# Patient Record
Sex: Female | Born: 1935 | Race: White | Hispanic: No | Smoking: Never smoker
Health system: Southern US, Community
[De-identification: ages and names within clinical notes are randomized; demographics above are authoritative.]

## PROBLEM LIST (undated history)

## (undated) DIAGNOSIS — F329 Major depressive disorder, single episode, unspecified: Secondary | ICD-10-CM

## (undated) DIAGNOSIS — G43909 Migraine, unspecified, not intractable, without status migrainosus: Secondary | ICD-10-CM

## (undated) DIAGNOSIS — R7303 Prediabetes: Secondary | ICD-10-CM

## (undated) DIAGNOSIS — E559 Vitamin D deficiency, unspecified: Secondary | ICD-10-CM

## (undated) DIAGNOSIS — D649 Anemia, unspecified: Secondary | ICD-10-CM

## (undated) DIAGNOSIS — G4730 Sleep apnea, unspecified: Secondary | ICD-10-CM

## (undated) DIAGNOSIS — K219 Gastro-esophageal reflux disease without esophagitis: Secondary | ICD-10-CM

## (undated) DIAGNOSIS — F419 Anxiety disorder, unspecified: Secondary | ICD-10-CM

## (undated) DIAGNOSIS — I10 Essential (primary) hypertension: Secondary | ICD-10-CM

## (undated) DIAGNOSIS — M1990 Unspecified osteoarthritis, unspecified site: Secondary | ICD-10-CM

## (undated) DIAGNOSIS — F32A Depression, unspecified: Secondary | ICD-10-CM

## (undated) DIAGNOSIS — E785 Hyperlipidemia, unspecified: Secondary | ICD-10-CM

## (undated) HISTORY — DX: Sleep apnea, unspecified: G47.30

## (undated) HISTORY — DX: Vitamin D deficiency, unspecified: E55.9

## (undated) HISTORY — DX: Unspecified osteoarthritis, unspecified site: M19.90

## (undated) HISTORY — DX: Anemia, unspecified: D64.9

## (undated) HISTORY — DX: Hyperlipidemia, unspecified: E78.5

## (undated) HISTORY — DX: Depression, unspecified: F32.A

## (undated) HISTORY — DX: Prediabetes: R73.03

## (undated) HISTORY — DX: Major depressive disorder, single episode, unspecified: F32.9

## (undated) HISTORY — PX: CATARACT EXTRACTION: SUR2

## (undated) HISTORY — DX: Anxiety disorder, unspecified: F41.9

## (undated) HISTORY — DX: Gastro-esophageal reflux disease without esophagitis: K21.9

## (undated) HISTORY — DX: Essential (primary) hypertension: I10

## (undated) HISTORY — DX: Migraine, unspecified, not intractable, without status migrainosus: G43.909

## (undated) HISTORY — PX: KNEE ARTHROSCOPY: SUR90

## (undated) HISTORY — PX: WRIST FRACTURE SURGERY: SHX121

## (undated) HISTORY — PX: BUNIONECTOMY: SHX129

---

## 1997-05-07 ENCOUNTER — Ambulatory Visit (HOSPITAL_BASED_OUTPATIENT_CLINIC_OR_DEPARTMENT_OTHER): Admission: RE | Admit: 1997-05-07 | Discharge: 1997-05-07 | Payer: Self-pay | Admitting: Orthopedic Surgery

## 1997-08-13 ENCOUNTER — Encounter: Admission: RE | Admit: 1997-08-13 | Discharge: 1997-11-11 | Payer: Self-pay | Admitting: Psychiatry

## 1997-10-31 ENCOUNTER — Ambulatory Visit (HOSPITAL_BASED_OUTPATIENT_CLINIC_OR_DEPARTMENT_OTHER): Admission: RE | Admit: 1997-10-31 | Discharge: 1997-10-31 | Payer: Self-pay | Admitting: Orthopedic Surgery

## 1997-11-08 ENCOUNTER — Other Ambulatory Visit: Admission: RE | Admit: 1997-11-08 | Discharge: 1997-11-08 | Payer: Self-pay | Admitting: Obstetrics and Gynecology

## 1998-11-28 ENCOUNTER — Ambulatory Visit (HOSPITAL_BASED_OUTPATIENT_CLINIC_OR_DEPARTMENT_OTHER): Admission: RE | Admit: 1998-11-28 | Discharge: 1998-11-28 | Payer: Self-pay | Admitting: Orthopedic Surgery

## 1999-04-24 ENCOUNTER — Encounter: Admission: RE | Admit: 1999-04-24 | Discharge: 1999-04-24 | Payer: Self-pay | Admitting: Psychiatry

## 1999-04-24 ENCOUNTER — Encounter: Payer: Self-pay | Admitting: Obstetrics and Gynecology

## 1999-04-27 ENCOUNTER — Encounter: Admission: RE | Admit: 1999-04-27 | Discharge: 1999-04-27 | Payer: Self-pay | Admitting: Obstetrics and Gynecology

## 1999-04-27 ENCOUNTER — Encounter: Payer: Self-pay | Admitting: Obstetrics and Gynecology

## 1999-09-21 ENCOUNTER — Encounter: Payer: Self-pay | Admitting: Emergency Medicine

## 1999-09-21 ENCOUNTER — Emergency Department (HOSPITAL_COMMUNITY): Admission: EM | Admit: 1999-09-21 | Discharge: 1999-09-21 | Payer: Self-pay | Admitting: Emergency Medicine

## 1999-09-25 ENCOUNTER — Encounter: Payer: Self-pay | Admitting: *Deleted

## 1999-09-30 ENCOUNTER — Encounter (INDEPENDENT_AMBULATORY_CARE_PROVIDER_SITE_OTHER): Payer: Self-pay | Admitting: Specialist

## 1999-10-01 ENCOUNTER — Inpatient Hospital Stay (HOSPITAL_COMMUNITY): Admission: EM | Admit: 1999-10-01 | Discharge: 1999-10-02 | Payer: Self-pay | Admitting: *Deleted

## 2000-04-27 ENCOUNTER — Encounter: Admission: RE | Admit: 2000-04-27 | Discharge: 2000-04-27 | Payer: Self-pay | Admitting: Obstetrics and Gynecology

## 2000-04-27 ENCOUNTER — Encounter: Payer: Self-pay | Admitting: Obstetrics and Gynecology

## 2000-04-28 ENCOUNTER — Encounter: Payer: Self-pay | Admitting: Obstetrics and Gynecology

## 2000-04-28 ENCOUNTER — Encounter: Admission: RE | Admit: 2000-04-28 | Discharge: 2000-04-28 | Payer: Self-pay | Admitting: Obstetrics and Gynecology

## 2000-11-14 ENCOUNTER — Other Ambulatory Visit: Admission: RE | Admit: 2000-11-14 | Discharge: 2000-11-14 | Payer: Self-pay | Admitting: Internal Medicine

## 2000-11-14 ENCOUNTER — Encounter (INDEPENDENT_AMBULATORY_CARE_PROVIDER_SITE_OTHER): Payer: Self-pay | Admitting: Specialist

## 2001-01-25 HISTORY — PX: CHOLECYSTECTOMY: SHX55

## 2001-04-20 ENCOUNTER — Ambulatory Visit (HOSPITAL_BASED_OUTPATIENT_CLINIC_OR_DEPARTMENT_OTHER): Admission: RE | Admit: 2001-04-20 | Discharge: 2001-04-20 | Payer: Self-pay | Admitting: Obstetrics and Gynecology

## 2001-04-20 ENCOUNTER — Encounter (INDEPENDENT_AMBULATORY_CARE_PROVIDER_SITE_OTHER): Payer: Self-pay | Admitting: Specialist

## 2001-05-01 ENCOUNTER — Encounter: Payer: Self-pay | Admitting: Obstetrics and Gynecology

## 2001-05-01 ENCOUNTER — Encounter: Admission: RE | Admit: 2001-05-01 | Discharge: 2001-05-01 | Payer: Self-pay | Admitting: Obstetrics and Gynecology

## 2001-07-17 ENCOUNTER — Encounter: Payer: Self-pay | Admitting: Internal Medicine

## 2001-07-17 ENCOUNTER — Encounter: Admission: RE | Admit: 2001-07-17 | Discharge: 2001-07-17 | Payer: Self-pay | Admitting: Internal Medicine

## 2002-02-06 ENCOUNTER — Ambulatory Visit (HOSPITAL_COMMUNITY): Admission: RE | Admit: 2002-02-06 | Discharge: 2002-02-06 | Payer: Self-pay | Admitting: Orthopedic Surgery

## 2002-04-21 ENCOUNTER — Emergency Department (HOSPITAL_COMMUNITY): Admission: EM | Admit: 2002-04-21 | Discharge: 2002-04-21 | Payer: Self-pay | Admitting: Emergency Medicine

## 2002-04-21 ENCOUNTER — Encounter: Payer: Self-pay | Admitting: Emergency Medicine

## 2002-05-03 ENCOUNTER — Encounter: Payer: Self-pay | Admitting: Obstetrics and Gynecology

## 2002-05-03 ENCOUNTER — Encounter: Admission: RE | Admit: 2002-05-03 | Discharge: 2002-05-03 | Payer: Self-pay | Admitting: Obstetrics and Gynecology

## 2003-05-07 ENCOUNTER — Encounter: Admission: RE | Admit: 2003-05-07 | Discharge: 2003-05-07 | Payer: Self-pay | Admitting: Obstetrics and Gynecology

## 2003-10-29 ENCOUNTER — Encounter: Admission: RE | Admit: 2003-10-29 | Discharge: 2003-10-29 | Payer: Self-pay | Admitting: Obstetrics and Gynecology

## 2003-11-14 ENCOUNTER — Encounter (INDEPENDENT_AMBULATORY_CARE_PROVIDER_SITE_OTHER): Payer: Self-pay | Admitting: Specialist

## 2003-11-14 ENCOUNTER — Encounter: Admission: RE | Admit: 2003-11-14 | Discharge: 2003-11-14 | Payer: Self-pay | Admitting: Surgery

## 2004-10-21 ENCOUNTER — Encounter: Admission: RE | Admit: 2004-10-21 | Discharge: 2004-10-21 | Payer: Self-pay | Admitting: Obstetrics and Gynecology

## 2005-09-29 ENCOUNTER — Encounter: Admission: RE | Admit: 2005-09-29 | Discharge: 2005-09-29 | Payer: Self-pay | Admitting: Obstetrics and Gynecology

## 2006-03-16 ENCOUNTER — Emergency Department (HOSPITAL_COMMUNITY): Admission: EM | Admit: 2006-03-16 | Discharge: 2006-03-16 | Payer: Self-pay | Admitting: Emergency Medicine

## 2006-10-03 ENCOUNTER — Encounter: Admission: RE | Admit: 2006-10-03 | Discharge: 2006-10-03 | Payer: Self-pay | Admitting: Obstetrics and Gynecology

## 2007-08-01 ENCOUNTER — Emergency Department (HOSPITAL_COMMUNITY): Admission: EM | Admit: 2007-08-01 | Discharge: 2007-08-02 | Payer: Self-pay | Admitting: Emergency Medicine

## 2007-10-04 ENCOUNTER — Encounter: Admission: RE | Admit: 2007-10-04 | Discharge: 2007-10-04 | Payer: Self-pay | Admitting: Obstetrics and Gynecology

## 2008-10-07 ENCOUNTER — Encounter: Admission: RE | Admit: 2008-10-07 | Discharge: 2008-10-07 | Payer: Self-pay | Admitting: Obstetrics and Gynecology

## 2008-10-07 ENCOUNTER — Encounter (INDEPENDENT_AMBULATORY_CARE_PROVIDER_SITE_OTHER): Payer: Self-pay | Admitting: *Deleted

## 2008-10-07 LAB — HM MAMMOGRAPHY: HM Mammogram: NORMAL

## 2008-11-05 ENCOUNTER — Ambulatory Visit: Payer: Self-pay | Admitting: Internal Medicine

## 2008-11-18 ENCOUNTER — Ambulatory Visit: Payer: Self-pay | Admitting: Internal Medicine

## 2008-11-27 ENCOUNTER — Encounter: Payer: Self-pay | Admitting: Internal Medicine

## 2009-10-09 ENCOUNTER — Encounter: Admission: RE | Admit: 2009-10-09 | Discharge: 2009-10-09 | Payer: Self-pay | Admitting: Obstetrics and Gynecology

## 2010-02-17 LAB — HM PAP SMEAR: HM Pap smear: NORMAL

## 2010-06-12 NOTE — Op Note (Signed)
[facility]  Patient:    Stephanie Roach, Stephanie Roach                      MRN: [id number] Proc. Date: 09/30/99 Adm. Date:  [id number] Attending:  Kandis Mannan CC:         Ammie Dalton, M.D.  Gypsy Balsam, M.D.  Hedwig Morton. Juanda Chance, M.D. [facility]   Operative Report  CCS NUMBER:  [id number]  PREOPERATIVE DIAGNOSIS:  Chronic cholecystitis with cholelithiasis.  POSTOPERATIVE DIAGNOSIS:  Chronic cholecystitis with cholelithiasis.  OPERATION:  Laparoscopic cholecystectomy.  SURGEON:  Maisie Fus B. Samuella Cota, M.D.  ASSISTANT:  Anselm Pancoast. Zachery Dakins, M.D.  ANESTHESIA:  General.  ANESTHESIOLOGIST:  CRNA.  DESCRIPTION OF PROCEDURE:  The patient was taken to the operating room and placed on the table in the supine position.  After satisfactory general anesthetic with intubation, the entire abdomen was prepped and draped as a sterile field.  A vertical infraumbilical incision was made through the skin and subcutaneous tissue.  The midline fascia was divided and the peritoneal cavity entered.  A finger was placed into the peritoneal cavity.  A pursestring suture of 0 Vicryl was placed and the Hassan trocar placed into the abdomen and the abdomen was insufflated to 14 mmHg pressure.  A second 10 mm trocar was placed just to the right of the midline in the subxiphoid area.  Two 5 mm trocars were placed laterally.  Exploration of the pelvis was unremarkable.  The patients gallbladder was placed on traction and there were no adhesions to the gallbladder.  The cystic duct was readily identified, was a small cystic duct with good length.  The junction of the cystic duct to the gallbladder was well-identified.  The cystic duct was triply clipped on remaining side, and once on the gallbladder side and divided.  The cystic artery was readily seen and triply clipped on the remaining side once on the gallbladder side and divided.  The gallbladder was dissected from the bed.   A second quadrant was identified and this was triply clipped and divided.  The gallbladder was dissected from the bed with hemostasis being controlled.  The gallbladder was then easily removed from the infraumbilical incision.  There was some bleeding noted at the 5 mm trocar site which was placed immediately. Attempts were made to cauterize this from within, but there was still some ooze, so an endosuture was placed using 0 Vicryls.  The suture was tied down after it traversed the puncture site and bleeding was controlled.  The abdomen was copiously irrigated with saline.  The infraumbilical incision was then closed with two figure-of-eight sutures placed vertically.  This gave good closure.  A second 5 mm trocar was removed and there was no bleeding. After the fluid in the right upper quadrant had been suctioned, the second 10 mm trocar was removed and the abdomen was deflated.  The two midline incisions were closed with running subcuticular sutures of 5-0 Vicryl.  The two lateral trocar sites were closed with interrupted sutures of 5-0 Vicryl as single sutures.  Benzoin 1/2 inch Steri-Strips were used to reinforce the skin closure.  Quarter percent Marcaine without epinephrine had been injected at all trocar sites.  The patient seemed to tolerate the procedure well and was taken to the PACU in satisfactory condition. DD:  09/30/99 TD:  09/30/99 Job: [id number]

## 2010-06-12 NOTE — Op Note (Signed)
[facility]  Patient:    Stephanie Roach, Stephanie Roach I Visit Number: [id number] MRN: [id number]          Service Type: NES Location: NESC Attending Physician:  Lendon Colonel Dictated by:   Kathie Rhodes. Kyra Manges, M.D. Proc. Date: 04/20/01 Admit Date:  04/20/2001                             Operative Report  PREOPERATIVE DIAGNOSIS:  Postmenopausal bleeding.  POSTOPERATIVE DIAGNOSIS:  Submucous myoma.  OPERATION PERFORMED:  Hysteroscopy with resection of submucous myoma.  DESCRIPTION OF PROCEDURE:  The patient was placed in lithotomy position and prepped and draped in the usual fashion, bladder emptied, cervix dilated. The hysteroscope was inserted into the uterus and there was a fundal submucous myoma which was resected without difficulty. All of the endometrial cavity was resected. All the tissue that was removed was sent to the lab for study and then we rescoped her and found her to be perfectly okay. No unusual blood loss occurred. She was awakened and carried to the recovery room in good condition. ictated by:   S. Kyra Manges, M.D. Attending Physician:  Lendon Colonel DD:  04/20/01 TD:  04/21/01 Job: [id number]

## 2010-08-06 ENCOUNTER — Encounter: Payer: Self-pay | Admitting: *Deleted

## 2010-08-06 ENCOUNTER — Encounter: Payer: Self-pay | Admitting: Family Medicine

## 2010-08-07 ENCOUNTER — Encounter (HOSPITAL_COMMUNITY): Payer: Self-pay

## 2010-08-10 ENCOUNTER — Encounter (HOSPITAL_COMMUNITY): Payer: Self-pay

## 2010-08-11 ENCOUNTER — Other Ambulatory Visit (INDEPENDENT_AMBULATORY_CARE_PROVIDER_SITE_OTHER): Payer: Medicare Other

## 2010-08-11 ENCOUNTER — Encounter: Payer: Self-pay | Admitting: Physician Assistant

## 2010-08-11 ENCOUNTER — Ambulatory Visit (INDEPENDENT_AMBULATORY_CARE_PROVIDER_SITE_OTHER): Payer: Medicare Other | Admitting: Physician Assistant

## 2010-08-11 VITALS — BP 126/64 | HR 100 | Ht 65.0 in | Wt 152.0 lb

## 2010-08-11 DIAGNOSIS — D509 Iron deficiency anemia, unspecified: Secondary | ICD-10-CM

## 2010-08-11 DIAGNOSIS — F411 Generalized anxiety disorder: Secondary | ICD-10-CM

## 2010-08-11 DIAGNOSIS — F419 Anxiety disorder, unspecified: Secondary | ICD-10-CM

## 2010-08-11 DIAGNOSIS — Z860101 Personal history of adenomatous and serrated colon polyps: Secondary | ICD-10-CM

## 2010-08-11 DIAGNOSIS — Z86010 Personal history of colonic polyps: Secondary | ICD-10-CM

## 2010-08-11 DIAGNOSIS — Z800 Family history of malignant neoplasm of digestive organs: Secondary | ICD-10-CM

## 2010-08-11 LAB — CBC WITH DIFFERENTIAL/PLATELET
Basophils Absolute: 0 10*3/uL (ref 0.0–0.1)
Basophils Relative: 0.3 % (ref 0.0–3.0)
Eosinophils Absolute: 0 10*3/uL (ref 0.0–0.7)
Eosinophils Relative: 0 % (ref 0.0–5.0)
HCT: 31.3 % — ABNORMAL LOW (ref 36.0–46.0)
Hemoglobin: 9.7 g/dL — ABNORMAL LOW (ref 12.0–15.0)
Lymphocytes Relative: 27.1 % (ref 12.0–46.0)
Lymphs Abs: 2.1 10*3/uL (ref 0.7–4.0)
MCHC: 31 g/dL (ref 30.0–36.0)
MCV: 69.3 fl — ABNORMAL LOW (ref 78.0–100.0)
Monocytes Absolute: 0.6 10*3/uL (ref 0.1–1.0)
Monocytes Relative: 7.5 % (ref 3.0–12.0)
Neutro Abs: 5.1 10*3/uL (ref 1.4–7.7)
Neutrophils Relative %: 65.1 % (ref 43.0–77.0)
Platelets: 350 10*3/uL (ref 150.0–400.0)
RBC: 4.52 Mil/uL (ref 3.87–5.11)
RDW: 16.8 % — ABNORMAL HIGH (ref 11.5–14.6)
WBC: 7.8 10*3/uL (ref 4.5–10.5)

## 2010-08-11 MED ORDER — INTEGRA F 125-1 MG PO CAPS: 1.0000 | ORAL_CAPSULE | Freq: Every day | ORAL | Status: DC

## 2010-08-11 MED ORDER — PEG-KCL-NACL-NASULF-NA ASC-C 100 G PO SOLR: 1.0000 | Freq: Once | ORAL | Status: AC

## 2010-08-11 NOTE — Progress Notes (Signed)
Subjective:    Patient ID: Stephanie Roach, female    DOB: [birth year], 75 y.o.   MRN: [id number]  HPI Stephanie Roach is a pleasant 75 year old white female known to Dr. Lina Sar from prior colonoscopies. The patient does have a positive family history of colon cancer in her father and last underwent: Colonoscopy in October of 2010 which was a negative exam with no evidence of polyps. She does have a history of tubular adenomatous polyps found in 2002, subsequent followup in 2005 was negative.  Patient is referred back today with new finding of an iron deficiency anemia. She says she has no prior history of iron deficiency or anemia other than associated with pregnancy many years ago. Most recent labs done 08/01/2010 shows a hemoglobin of 9.7 hematocrit 32.4 MCV of 70.3 platelet count 372 WBC of 6.8 electrolytes within normal limits serum iron was low at 34 TIBC greater than 450 and iron saturation not calculated Hemoccult cards x6 negative last week.  Patient does mention that she has been having some upper abdominal pain for several months which she had not was due to her irritable bowel. Her pain seems to be located in the epigastrium and is dull and fairly constant. She says her appetite has been fine and her weight has been stable. She does have episodes of urgency for bowel movement after eating and has intermittent diarrhea. She has not noted any melena or hematochezia. She denies any heartburn but says she does get periodic reflux and burning and she has a very vague complaints of dysphagia. She has not been on any acid blocker. She does take Advil fairly frequently for sinus problems though not every single day. She is a TE[employer] teacher [employer], and states that she would not accept any blood transfusions.    Review of Systems  Constitutional: Positive for fatigue.  HENT: Negative.   Eyes: Negative.   Respiratory: Negative.   Cardiovascular: Negative.   Gastrointestinal: Negative.   Genitourinary:  Negative.   Musculoskeletal: Negative.   Skin: Negative.   Neurological: Negative.   Hematological: Negative.   Psychiatric/Behavioral: The patient is nervous/anxious.    Outpatient Prescriptions Prior to Visit  Medication Sig Dispense Refill  . calcium carbonate 200 MG capsule Take 250 mg by mouth daily.        Marland Kitchen LORazepam (ATIVAN) 1 MG tablet Take 1 mg by mouth every 8 (eight) hours.        Marland Kitchen venlafaxine (EFFEXOR-XR) 75 MG 24 hr capsule Take 75 mg by mouth daily.        Marland Kitchen dicyclomine (BENTYL) 20 MG tablet Take 20 mg by mouth as needed.         Allergies  Allergen Reactions  . Morphine And Related        Objective:   Physical Exam Well-developed white female in no acute distress, anxious, pleasant, alert and oriented x3 HEENT; nontraumatic normocephalic EOMI PERRLA sclera anicteric nECK; Supple no JVD Cardiovascular; regular rate and rhythm with S1-S2 no murmur rub or gallop Pulmonary ;clear bilaterally, Abdomen; soft nontender nondistended bowel sounds active no palpable mass or hepatosplenomegaly, Rectal exam; not done, patient completed Hemoccult cards within the past week negative x6  Skin; warm and dry benign  Psych ;anxious but mood and affect otherwise appropriate        Assessment & Plan:  #83 75 year old female with new diagnosis of iron deficiency anemia, currently Hemoccult negative. Patient has personal history of adenomatous polyps and family history of colon cancer. Last colonoscopy October  2010 was unremarkable. Patient relates epigastric discomfort over the past several months and what sounds like intermittent acid reflux.  Plan Repeat CBC today to assure stability Start Prilosec 20 mg by mouth daily as a trial over the next 2 weeks to see if they want this will help with her acid reflux Start Integra , one daily for iron supplementation x3 months ,then will  need repeat iron studies. Schedule for upper endoscopy and colonoscopy with Dr. Lina Sar. Procedures were  discussed in detail with the patient and she prefers to proceed with both exams at this time.

## 2010-08-11 NOTE — Patient Instructions (Signed)
Please go to the basement level to have your labs drawn.   We sent your prescription for the Moviprep to your pharmacy, [facility]. We also sent a prescription for the iron supplement and we have given you samples. Take 1 capsule daily for 3 months. We scheduled the Endoscopy/Colonoscopy with Dr Lina Sar.  Directions and brochure provided.

## 2010-08-12 ENCOUNTER — Telehealth: Payer: Self-pay | Admitting: *Deleted

## 2010-08-12 ENCOUNTER — Telehealth: Payer: Self-pay | Admitting: Internal Medicine

## 2010-08-12 NOTE — Telephone Encounter (Signed)
Patient notified of results.

## 2010-08-12 NOTE — Progress Notes (Signed)
Reviewed and agree with management. Robert D. Kaplan, M.D., FACG  

## 2010-08-12 NOTE — Telephone Encounter (Signed)
Message copied by Daphine Deutscher on Wed Aug 12, 2010  2:07 PM ------      Message from: Icard, Virginia S      Created: Wed Aug 12, 2010  1:46 PM       PLEASE LET PT KNOW HER HGB IS 9.7, SO SAME AS LAST WEEK-STABLE

## 2010-08-12 NOTE — Telephone Encounter (Signed)
See phone note on 7/181/2

## 2010-08-12 NOTE — Telephone Encounter (Signed)
Patient asking for lab results. Please, advise

## 2010-08-14 NOTE — Progress Notes (Signed)
Reviewed and agree with management. Lorali Khamis D. Minh Jasper, M.D., FACG  

## 2010-08-27 ENCOUNTER — Ambulatory Visit (AMBULATORY_SURGERY_CENTER): Payer: Medicare Other | Admitting: Internal Medicine

## 2010-08-27 ENCOUNTER — Encounter: Payer: Self-pay | Admitting: Internal Medicine

## 2010-08-27 DIAGNOSIS — Z1211 Encounter for screening for malignant neoplasm of colon: Secondary | ICD-10-CM

## 2010-08-27 DIAGNOSIS — Z86010 Personal history of colonic polyps: Secondary | ICD-10-CM

## 2010-08-27 DIAGNOSIS — K2940 Chronic atrophic gastritis without bleeding: Secondary | ICD-10-CM

## 2010-08-27 DIAGNOSIS — Z800 Family history of malignant neoplasm of digestive organs: Secondary | ICD-10-CM

## 2010-08-27 DIAGNOSIS — A048 Other specified bacterial intestinal infections: Secondary | ICD-10-CM

## 2010-08-27 DIAGNOSIS — D759 Disease of blood and blood-forming organs, unspecified: Secondary | ICD-10-CM

## 2010-08-27 DIAGNOSIS — R109 Unspecified abdominal pain: Secondary | ICD-10-CM

## 2010-08-27 DIAGNOSIS — D509 Iron deficiency anemia, unspecified: Secondary | ICD-10-CM

## 2010-08-27 MED ORDER — SODIUM CHLORIDE 0.9 % IV SOLN: 500.0000 mL | INTRAVENOUS | Status: DC

## 2010-08-27 NOTE — Patient Instructions (Signed)
Take your Prilosec 20 mg (which you can purchase over-the-counter) EVERY DAY.  Take one capsule every morning 30 minutes before breakfast.

## 2010-08-27 NOTE — Progress Notes (Signed)
PATIENT STATING SHE WILL NOT TAKE A BLOOD TRANSFUSION.

## 2010-08-28 ENCOUNTER — Telehealth: Payer: Self-pay | Admitting: *Deleted

## 2010-08-28 NOTE — Telephone Encounter (Signed)
Follow up Call- Patient questions:  Do you have a fever, pain , or abdominal swelling? no Pain Score  2 *  Have you tolerated food without any problems? yes  Have you been able to return to your normal activities? yes  Do you have any questions about your discharge instructions: Diet   no Medications  no Follow up visit  no  Do you have questions or concerns about your Care? yes  Actions: * If pain score is 4 or above: No action needed, pain <4.

## 2010-09-01 ENCOUNTER — Encounter: Payer: Self-pay | Admitting: Internal Medicine

## 2010-09-11 ENCOUNTER — Other Ambulatory Visit: Payer: Self-pay | Admitting: Family Medicine

## 2010-09-11 DIAGNOSIS — Z1231 Encounter for screening mammogram for malignant neoplasm of breast: Secondary | ICD-10-CM

## 2010-10-12 ENCOUNTER — Ambulatory Visit
Admission: RE | Admit: 2010-10-12 | Discharge: 2010-10-12 | Disposition: A | Discharge status: Home / Self Care | Payer: Medicare Other | Source: Ambulatory Visit | Attending: Family Medicine | Admitting: Family Medicine

## 2010-10-12 DIAGNOSIS — Z1231 Encounter for screening mammogram for malignant neoplasm of breast: Secondary | ICD-10-CM

## 2010-10-16 ENCOUNTER — Other Ambulatory Visit: Payer: Self-pay | Admitting: Family Medicine

## 2010-10-16 DIAGNOSIS — R928 Other abnormal and inconclusive findings on diagnostic imaging of breast: Secondary | ICD-10-CM

## 2010-10-20 ENCOUNTER — Ambulatory Visit
Admission: RE | Admit: 2010-10-20 | Discharge: 2010-10-20 | Disposition: A | Discharge status: Home / Self Care | Payer: Medicare Other | Source: Ambulatory Visit | Attending: Family Medicine | Admitting: Family Medicine

## 2010-10-20 DIAGNOSIS — R928 Other abnormal and inconclusive findings on diagnostic imaging of breast: Secondary | ICD-10-CM

## 2010-10-22 LAB — POCT I-STAT, CHEM 8
BUN: 8
Calcium, Ion: 1.05 — ABNORMAL LOW
HCT: 38
Hemoglobin: 12.9
Sodium: 141
TCO2: 26

## 2010-10-22 LAB — POCT CARDIAC MARKERS
CKMB, poc: 1.5
Myoglobin, poc: 70
Operator id: 229371
Troponin i, poc: 0.15 — ABNORMAL HIGH

## 2010-10-22 LAB — CK TOTAL AND CKMB (NOT AT ARMC)
Relative Index: INVALID
Total CK: 61

## 2011-09-22 ENCOUNTER — Other Ambulatory Visit: Payer: Self-pay | Admitting: Family Medicine

## 2011-09-22 DIAGNOSIS — Z1231 Encounter for screening mammogram for malignant neoplasm of breast: Secondary | ICD-10-CM

## 2011-10-13 ENCOUNTER — Ambulatory Visit
Admission: RE | Admit: 2011-10-13 | Discharge: 2011-10-13 | Disposition: A | Discharge status: Home / Self Care | Payer: Medicare Other | Source: Ambulatory Visit | Attending: Family Medicine | Admitting: Family Medicine

## 2011-10-13 DIAGNOSIS — Z1231 Encounter for screening mammogram for malignant neoplasm of breast: Secondary | ICD-10-CM

## 2012-09-06 ENCOUNTER — Other Ambulatory Visit: Payer: Self-pay

## 2012-09-06 DIAGNOSIS — Z1231 Encounter for screening mammogram for malignant neoplasm of breast: Secondary | ICD-10-CM

## 2012-10-13 ENCOUNTER — Ambulatory Visit
Admission: RE | Admit: 2012-10-13 | Discharge: 2012-10-13 | Disposition: A | Discharge status: Home / Self Care | Payer: Self-pay | Source: Ambulatory Visit

## 2012-10-13 DIAGNOSIS — Z1231 Encounter for screening mammogram for malignant neoplasm of breast: Secondary | ICD-10-CM

## 2012-10-16 ENCOUNTER — Other Ambulatory Visit: Payer: Self-pay | Admitting: Internal Medicine

## 2012-10-16 DIAGNOSIS — R928 Other abnormal and inconclusive findings on diagnostic imaging of breast: Secondary | ICD-10-CM

## 2012-10-19 ENCOUNTER — Ambulatory Visit
Admission: RE | Admit: 2012-10-19 | Discharge: 2012-10-19 | Disposition: A | Discharge status: Home / Self Care | Payer: Medicare Other | Source: Ambulatory Visit | Attending: Internal Medicine | Admitting: Internal Medicine

## 2012-10-19 DIAGNOSIS — R928 Other abnormal and inconclusive findings on diagnostic imaging of breast: Secondary | ICD-10-CM

## 2012-12-06 ENCOUNTER — Encounter: Payer: Self-pay | Admitting: Physician Assistant

## 2012-12-06 DIAGNOSIS — E785 Hyperlipidemia, unspecified: Secondary | ICD-10-CM

## 2012-12-06 DIAGNOSIS — I10 Essential (primary) hypertension: Secondary | ICD-10-CM

## 2012-12-06 DIAGNOSIS — E782 Mixed hyperlipidemia: Secondary | ICD-10-CM | POA: Insufficient documentation

## 2012-12-06 DIAGNOSIS — R0989 Other specified symptoms and signs involving the circulatory and respiratory systems: Secondary | ICD-10-CM | POA: Insufficient documentation

## 2012-12-06 DIAGNOSIS — R7309 Other abnormal glucose: Secondary | ICD-10-CM | POA: Insufficient documentation

## 2012-12-06 DIAGNOSIS — E559 Vitamin D deficiency, unspecified: Secondary | ICD-10-CM

## 2012-12-06 DIAGNOSIS — E1169 Type 2 diabetes mellitus with other specified complication: Secondary | ICD-10-CM | POA: Insufficient documentation

## 2012-12-07 ENCOUNTER — Ambulatory Visit: Payer: Self-pay | Admitting: Physician Assistant

## 2012-12-15 ENCOUNTER — Encounter: Payer: Self-pay | Admitting: Physician Assistant

## 2012-12-15 ENCOUNTER — Ambulatory Visit: Payer: Medicare Other | Admitting: Physician Assistant

## 2012-12-15 VITALS — BP 118/70 | HR 96 | Temp 97.7°F | Resp 16 | Ht 65.0 in | Wt 146.0 lb

## 2012-12-15 DIAGNOSIS — Z79899 Other long term (current) drug therapy: Secondary | ICD-10-CM

## 2012-12-15 DIAGNOSIS — I10 Essential (primary) hypertension: Secondary | ICD-10-CM

## 2012-12-15 DIAGNOSIS — E785 Hyperlipidemia, unspecified: Secondary | ICD-10-CM

## 2012-12-15 DIAGNOSIS — R7303 Prediabetes: Secondary | ICD-10-CM

## 2012-12-15 DIAGNOSIS — E559 Vitamin D deficiency, unspecified: Secondary | ICD-10-CM

## 2012-12-15 LAB — CBC WITH DIFFERENTIAL/PLATELET
Eosinophils Absolute: 0 10*3/uL (ref 0.0–0.7)
Eosinophils Relative: 0 % (ref 0–5)
HCT: 35 % — ABNORMAL LOW (ref 36.0–46.0)
Hemoglobin: 11.6 g/dL — ABNORMAL LOW (ref 12.0–15.0)
Lymphocytes Relative: 29 % (ref 12–46)
Lymphs Abs: 1.5 10*3/uL (ref 0.7–4.0)
MCH: 25 pg — ABNORMAL LOW (ref 26.0–34.0)
MCV: 75.4 fL — ABNORMAL LOW (ref 78.0–100.0)
Monocytes Relative: 9 % (ref 3–12)
RBC: 4.64 MIL/uL (ref 3.87–5.11)
WBC: 5 10*3/uL (ref 4.0–10.5)

## 2012-12-15 LAB — LIPID PANEL
Cholesterol: 226 mg/dL — ABNORMAL HIGH (ref 0–200)
Total CHOL/HDL Ratio: 3.4 Ratio
VLDL: 37 mg/dL (ref 0–40)

## 2012-12-15 LAB — BASIC METABOLIC PANEL WITH GFR
CO2: 29 mEq/L (ref 19–32)
Calcium: 9 mg/dL (ref 8.4–10.5)
Chloride: 105 mEq/L (ref 96–112)
Creat: 0.74 mg/dL (ref 0.50–1.10)
Glucose, Bld: 87 mg/dL (ref 70–99)

## 2012-12-15 LAB — HEPATIC FUNCTION PANEL
ALT: 10 U/L (ref 0–35)
AST: 15 U/L (ref 0–37)
Albumin: 3.9 g/dL (ref 3.5–5.2)
Alkaline Phosphatase: 85 U/L (ref 39–117)
Bilirubin, Direct: 0.1 mg/dL (ref 0.0–0.3)
Total Bilirubin: 0.3 mg/dL (ref 0.3–1.2)

## 2012-12-15 LAB — HEMOGLOBIN A1C
Hgb A1c MFr Bld: 5.8 % — ABNORMAL HIGH (ref <5.7)
Mean Plasma Glucose: 120 mg/dL — ABNORMAL HIGH (ref <117)

## 2012-12-15 LAB — TSH: TSH: 1.657 u[IU]/mL (ref 0.350–4.500)

## 2012-12-15 NOTE — Progress Notes (Signed)
HPI Patient presents for 3 month follow up with hypertension, hyperlipidemia, prediabetes and vitamin D. Patient's blood pressure has been controlled at home. Patient denies chest pain, shortness of breath, dizziness.  Patient's cholesterol is diet controlled. The cholesterol last visit was LDL 138, HDL 63, trigs 203 The patient has been working on diet and exercise for prediabetes, and denies changes in vision, polys, and paresthesias. A1C 5.8 (6.2) Patient is on Vitamin D supplement. 32 B12 was low normal in July at 387 and is not on a supplement. Patient was treated for Hpylori and states her symptoms resolved but recently over the past month she has been having GERD symptoms.  Current Medications:  Current Outpatient Prescriptions on File Prior to Visit  Medication Sig Dispense Refill  . calcium carbonate 200 MG capsule Take 250 mg by mouth daily.        . cholecalciferol (VITAMIN D) 1000 UNITS tablet Take 5,000 Units by mouth daily.      . Fe Fum-FePoly-FA-Vit C-Vit B3 (INTEGRA F) 125-1 MG CAPS Take 1 capsule by mouth daily.  30 capsule  1  . fish oil-omega-3 fatty acids 1000 MG capsule Take 2 g by mouth daily.      Marland Kitchen LORazepam (ATIVAN) 1 MG tablet Take 1 mg by mouth every 8 (eight) hours.        Marland Kitchen omeprazole (PRILOSEC) 20 MG capsule Take 20 mg by mouth daily.      . traZODone (DESYREL) 50 MG tablet       . venlafaxine (EFFEXOR-XR) 75 MG 24 hr capsule Take 75 mg by mouth daily.        . vitamin B-12 (CYANOCOBALAMIN) 250 MCG tablet Take 250 mcg by mouth daily.       Current Facility-Administered Medications on File Prior to Visit  Medication Dose Route Frequency Provider Last Rate Last Dose  . 0.9 %  sodium chloride infusion  500 mL Intravenous Continuous Hart Carwin, MD       Medical History:  Past Medical History  Diagnosis Date  . Anemia   . Hyperlipidemia   . Arthritis   . Anxiety   . Depression   . Allergic rhinitis   . Sleep apnea   . Hypertension   . Prediabetes   .  Migraine   . GERD (gastroesophageal reflux disease)   . Vitamin D deficiency    Allergies:  Allergies  Allergen Reactions  . Morphine And Related     ROS Constitutional: Denies fever, chills, weight loss/gain, headaches, insomnia, fatigue, night sweats, and change in appetite. Eyes: Denies redness, blurred vision, diplopia, discharge, itchy, watery eyes.  ENT: Denies discharge, congestion, post nasal drip, sore throat, earache, dental pain, Tinnitus, Vertigo, Sinus pain, snoring.  Cardio: Denies chest pain, palpitations, irregular heartbeat,  dyspnea, diaphoresis, orthopnea, PND, claudication, edema Respiratory: denies cough, dyspnea,pleurisy, hoarseness, wheezing.  Gastrointestinal: + GERD not as bad as before Denies dysphagia,  water brash, pain, cramps, nausea, vomiting, bloating, diarrhea, constipation, hematemesis, melena, hematochezia,  hemorrhoids Genitourinary: Denies dysuria, frequency, urgency, nocturia, hesitancy, discharge, hematuria, flank pain Musculoskeletal: Denies arthralgia, myalgia, stiffness, Jt. Swelling, pain, limp, and strain/sprain. Skin: Denies pruritis, rash, hives, warts, acne, eczema, changing in skin lesion Neuro: Weakness, tremor, incoordination, spasms, paresthesia, pain Psychiatric: Denies confusion, memory loss, sensory loss Endocrine: Denies change in weight, skin, hair change, nocturia, and paresthesia, Diabetic Polys, visual blurring, hyper /hypo glycemic episodes.  Heme/Lymph: Excessive bleeding, bruising, enlarged lymph nodes  Family history- Review and unchanged Social history- Review and unchanged  Physical Exam: Filed Vitals:   12/15/12 1143  BP: 118/70  Pulse: 96  Temp: 97.7 F (36.5 C)  Resp: 16   Filed Weights   12/15/12 1143  Weight: 146 lb (66.225 kg)   General Appearance: Well nourished, in no apparent distress. Eyes: PERRLA, EOMs, conjunctiva no swelling or erythema, normal fundi and vessels. Sinuses: No Frontal/maxillary  tenderness ENT/Mouth: Ext aud canals clear, with TMs without erythema, bulging.No erythema, swelling, or exudate on post pharynx.  Tonsils not swollen or erythematous. Hearing normal.  Neck: Supple, thyroid normal.  Respiratory: Respiratory effort normal, BS equal bilaterally without rales, rhonchi, wheezing or stridor.  Cardio: Heart sounds normal, regular rate and rhythm without murmurs, rubs or gallops. Peripheral pulses brisk and equal bilaterally, without edema.  Abdomen: Flat, soft, with bowel sounds. Non tender, no guarding, rebound, hernias, masses, or organomegaly.  Lymphatics: Non tender without lymphadenopathy.  Musculoskeletal: Full ROM all peripheral extremities, joint stability, 5/5 strength, and normal gait. Skin: Warm, dry without rashes, lesions, ecchymosis.  Neuro: Cranial nerves intact, reflexes equal bilaterally. Normal muscle tone, no cerebellar symptoms. Sensation intact.  Psych: Awake and oriented X 3, normal affect, Insight and Judgment appropriate.   Assessment and Plan:  Hypertension: Continue medication, monitor blood pressure at home. Continue DASH diet. Cholesterol: Continue diet and exercise. Check cholesterol.  Pre-diabetes-Continue diet and exercise. Check A1C Vitamin D Def- check level and continue medications.  GERD- treated for hyplori- diet given for GERD, start to take the prilosec daily and if it continues we will recheck or Hpylori or send her to GI   Quentin Mulling 11:56 AM

## 2012-12-15 NOTE — Patient Instructions (Addendum)
Start to take the prilosec/omeprazole daily and follow the diet below, if you continue to have symptoms we can retest you for hpylori or send you to a stomach doctor so they can take a look and look for ulcers.   Diet for Gastroesophageal Reflux Disease, Adult Reflux (acid reflux) is when acid from your stomach flows up into the esophagus. When acid comes in contact with the esophagus, the acid causes irritation and soreness (inflammation) in the esophagus. When reflux happens often or so severely that it causes damage to the esophagus, it is called gastroesophageal reflux disease (GERD). Nutrition therapy can help ease the discomfort of GERD. FOODS OR DRINKS TO AVOID OR LIMIT  Smoking or chewing tobacco. Nicotine is one of the most potent stimulants to acid production in the gastrointestinal tract.  Caffeinated and decaffeinated coffee and black tea.  Regular or low-calorie carbonated beverages or energy drinks (caffeine-free carbonated beverages are allowed).   Strong spices, such as black pepper, white pepper, red pepper, cayenne, curry powder, and chili powder.  Peppermint or spearmint.  Chocolate.  High-fat foods, including meats and fried foods. Extra added fats including oils, butter, salad dressings, and nuts. Limit these to less than 8 tsp per day.  Fruits and vegetables if they are not tolerated, such as citrus fruits or tomatoes.  Alcohol.  Any food that seems to aggravate your condition. If you have questions regarding your diet, call your caregiver or a registered dietitian. OTHER THINGS THAT MAY HELP GERD INCLUDE:   Eating your meals slowly, in a relaxed setting.  Eating 5 to 6 small meals per day instead of 3 large meals.  Eliminating food for a period of time if it causes distress.  Not lying down until 3 hours after eating a meal.  Keeping the head of your bed raised 6 to 9 inches (15 to 23 cm) by using a foam wedge or blocks under the legs of the bed. Lying flat  may make symptoms worse.  Being physically active. Weight loss may be helpful in reducing reflux in overweight or obese adults.  Wear loose fitting clothing EXAMPLE MEAL PLAN This meal plan is approximately 2,000 calories based on https://www.bernard.org/ meal planning guidelines. Breakfast   cup cooked oatmeal.  1 cup strawberries.  1 cup low-fat milk.  1 oz almonds. Snack  1 cup cucumber slices.  6 oz yogurt (made from low-fat or fat-free milk). Lunch  2 slice whole-wheat bread.  2 oz sliced [place].  2 tsp mayonnaise.  1 cup blueberries.  1 cup snap peas. Snack  6 whole-wheat crackers.  1 oz string cheese. Dinner   cup brown rice.  1 cup mixed veggies.  1 tsp olive oil.  3 oz grilled fish. Document Released: 01/11/2005 Document Revised: 04/05/2011 Document Reviewed: 11/27/2010 [facility] Patient Information 2014 [place], [place].  Marland Kitchen

## 2013-03-08 ENCOUNTER — Ambulatory Visit (INDEPENDENT_AMBULATORY_CARE_PROVIDER_SITE_OTHER): Payer: Medicare Other | Admitting: Emergency Medicine

## 2013-03-08 ENCOUNTER — Encounter: Payer: Self-pay | Admitting: Emergency Medicine

## 2013-03-08 VITALS — BP 104/78 | HR 86 | Temp 98.4°F | Resp 16 | Ht 65.0 in | Wt 144.0 lb

## 2013-03-08 DIAGNOSIS — R7309 Other abnormal glucose: Secondary | ICD-10-CM

## 2013-03-08 DIAGNOSIS — J309 Allergic rhinitis, unspecified: Secondary | ICD-10-CM

## 2013-03-08 DIAGNOSIS — E559 Vitamin D deficiency, unspecified: Secondary | ICD-10-CM

## 2013-03-08 DIAGNOSIS — E782 Mixed hyperlipidemia: Secondary | ICD-10-CM

## 2013-03-08 DIAGNOSIS — J329 Chronic sinusitis, unspecified: Secondary | ICD-10-CM

## 2013-03-08 MED ORDER — AZITHROMYCIN 250 MG PO TABS: ORAL_TABLET | ORAL | Status: AC

## 2013-03-08 NOTE — Progress Notes (Signed)
Subjective:    Patient ID: Stephanie Roach, female    DOB: [birth year], 78 y.o.   MRN: [id number]  HPI Comments: 78 yo female presents for 3 month F/U for labile HTN, Cholesterol, Pre-Dm, D. Deficient. She is not due til 2/21 for labs but wanted appointment early because she is sick. She eats descent and has been keeping busy. She is not on BP RX and does not check BP outside of office. Last elevated BP was 7/14 at 132/80 last OV was 118/70.  LAST LABS CHOL         226   12/15/2012 HDL           67   12/15/2012 LDLCALC      122   12/15/2012 TRIG         183   12/15/2012 CHOLHDL      3.4   12/15/2012 ALT           10   12/15/2012 AST           15   12/15/2012 ALKPHOS       85   12/15/2012 BILITOT      0.3   12/15/2012 CREATININE     0.74   12/15/2012 BUN              10   12/15/2012 NA              141   12/15/2012 K               3.9   12/15/2012 CL              105   12/15/2012 CO2              29   12/15/2012 HGBA1C      5.8   12/15/2012  She has been sick over 1 month with sinus/ chest congestion on/off. She notes mucus will be clear then will turn green to yellow and then clear again. She has been trying OTC Sinus without relief. She notes she is having increased sinus pressure over last week and mild sore throat from dry hacking cough.    Current Outpatient Prescriptions on File Prior to Visit  Medication Sig Dispense Refill  . calcium carbonate 200 MG capsule Take 250 mg by mouth daily.        . cholecalciferol (VITAMIN D) 1000 UNITS tablet Take 5,000 Units by mouth daily.      . Fe Fum-FePoly-FA-Vit C-Vit B3 (INTEGRA F) 125-1 MG CAPS Take 1 capsule by mouth daily.  30 capsule  1  . fish oil-omega-3 fatty acids 1000 MG capsule Take 2 g by mouth daily.      Marland Kitchen LORazepam (ATIVAN) 1 MG tablet Take 1 mg by mouth every 8 (eight) hours.        Marland Kitchen omeprazole (PRILOSEC) 20 MG capsule Take 20 mg by mouth daily.      . traZODone (DESYREL) 50 MG tablet       . venlafaxine (EFFEXOR-XR)  75 MG 24 hr capsule Take 75 mg by mouth daily.         Current Facility-Administered Medications on File Prior to Visit  Medication Dose Route Frequency Provider Last Rate Last Dose  . 0.9 %  sodium chloride infusion  500 mL Intravenous Continuous Lafayette Dragon, MD       Allergies  Allergen Reactions  . Morphine And Related    Past Medical History  Diagnosis Date  .  Anemia   . Hyperlipidemia   . Arthritis   . Anxiety   . Depression   . Allergic rhinitis   . Sleep apnea   . Hypertension   . Prediabetes   . Migraine   . GERD (gastroesophageal reflux disease)   . Vitamin D deficiency      Review of Systems  Constitutional: Positive for fatigue.  HENT: Positive for congestion, postnasal drip, sinus pressure and sore throat.   Respiratory: Positive for cough.   All other systems reviewed and are negative.   BP 104/78  Pulse 86  Temp(Src) 98.4 F (36.9 C) (Temporal)  Resp 16  Ht 5\' 5"  (1.651 m)  Wt 144 lb (65.318 kg)  BMI 23.96 kg/m2     Objective:   Physical Exam  Nursing note and vitals reviewed. Constitutional: She is oriented to person, place, and time. She appears well-developed and well-nourished. No distress.  HENT:  Head: Normocephalic and atraumatic.  Right Ear: External ear normal.  Left Ear: External ear normal.  Nose: Nose normal.  Mouth/Throat: Oropharynx is clear and moist. No oropharyngeal exudate.  Yellow TMs bilateral Post pharynx erythematous  Eyes: Conjunctivae and EOM are normal.  Neck: Normal range of motion. Neck supple. No JVD present. No thyromegaly present.  Cardiovascular: Normal rate, regular rhythm, normal heart sounds and intact distal pulses.   Pulmonary/Chest: Effort normal and breath sounds normal.  Congested Breath sounds, clears some with cough   Abdominal: Soft. Bowel sounds are normal. She exhibits no distension and no mass. There is no tenderness. There is no rebound and no guarding.  Musculoskeletal: Normal range of  motion. She exhibits no edema and no tenderness.  Lymphadenopathy:    She has no cervical adenopathy.  Neurological: She is alert and oriented to person, place, and time. No cranial nerve deficit.  Skin: Skin is warm and dry. No rash noted. No erythema. No pallor.  Psychiatric: She has a normal mood and affect. Her behavior is normal. Judgment and thought content normal.          Assessment & Plan:  1.  3 month F/U for labile HTN, Cholesterol, Pre-Dm, D. Deficient. Needs healthy diet, cardio QD and obtain healthy weight. Check Labs, Check BP if >130/80 call office. We will need to get labs drawn After 03/17/13 due to medicare guidelines. Patient asked to move appointment up due to illness. LABS TO BE DRAWN- CBC, BMET/LIPID/A1C/HFP/INSULIN  2. Sinusitis/ Cough/ Allergic rhinitis- Allegra OTC, increase H2o, allergy hygiene explained. Zpak AD, Delsym OTC AD. Declines prednisone.

## 2013-03-08 NOTE — Patient Instructions (Signed)
Allergic Rhinitis Allergic rhinitis is when the mucous membranes in the nose respond to allergens. Allergens are particles in the air that cause your body to have an allergic reaction. This causes you to release allergic antibodies. Through a chain of events, these eventually cause you to release histamine into the blood stream. Although meant to protect the body, it is this release of histamine that causes your discomfort, such as frequent sneezing, congestion, and an itchy, runny nose.  CAUSES  Seasonal allergic rhinitis (hay fever) is caused by pollen allergens that may come from grasses, trees, and weeds. Year-round allergic rhinitis (perennial allergic rhinitis) is caused by allergens such as house dust mites, pet dander, and mold spores.  SYMPTOMS   Nasal stuffiness (congestion).  Itchy, runny nose with sneezing and tearing of the eyes. DIAGNOSIS  Your health care provider can help you determine the allergen or allergens that trigger your symptoms. If you and your health care provider are unable to determine the allergen, skin or blood testing may be used. TREATMENT  Allergic Rhinitis does not have a cure, but it can be controlled by:  Medicines and allergy shots (immunotherapy).  Avoiding the allergen. Hay fever may often be treated with antihistamines in pill or nasal spray forms. Antihistamines block the effects of histamine. There are over-the-counter medicines that may help with nasal congestion and swelling around the eyes. Check with your health care provider before taking or giving this medicine.  If avoiding the allergen or the medicine prescribed do not work, there are many new medicines your health care provider can prescribe. Stronger medicine may be used if initial measures are ineffective. Desensitizing injections can be used if medicine and avoidance does not work. Desensitization is when a patient is given ongoing shots until the body becomes less sensitive to the allergen.  Make sure you follow up with your health care provider if problems continue. HOME CARE INSTRUCTIONS It is not possible to completely avoid allergens, but you can reduce your symptoms by taking steps to limit your exposure to them. It helps to know exactly what you are allergic to so that you can avoid your specific triggers. SEEK MEDICAL CARE IF:   You have a fever.  You develop a cough that does not stop easily (persistent).  You have shortness of breath.  You start wheezing.  Symptoms interfere with normal daily activities. Document Released: 10/06/2000 Document Revised: 11/01/2012 Document Reviewed: 09/18/2012 [facility] Patient Information 2014 [place]. Cough, Adult  A cough is a reflex. It helps you clear your throat and airways. A cough can help heal your body. A cough can last 2 or 3 weeks (acute) or may last more than 8 weeks (chronic). Some common causes of a cough can include an infection, allergy, or a cold. HOME CARE  Only take medicine as told by your doctor.  If given, take your medicines (antibiotics) as told. Finish them even if you start to feel better.  Use a cold steam vaporizer or humidier in your home. This can help loosen thick spit (secretions).  Sleep so you are almost sitting up (semi-upright). Use pillows to do this. This helps reduce coughing.  Rest as needed.  Stop smoking if you smoke. GET HELP RIGHT AWAY IF:  You have yellowish-white fluid (pus) in your thick spit.  Your cough gets worse.  Your medicine does not reduce coughing, and you are losing sleep.  You cough up blood.  You have trouble breathing.  Your pain gets worse and medicine  does not help.  You have a fever. MAKE SURE YOU:   Understand these instructions.  Will watch your condition.  Will get help right away if you are not doing well or get worse. Document Released: 09/24/2010 Document Revised: 04/05/2011 Document Reviewed: 09/24/2010 [facility] Patient Information  2014 [place].

## 2013-03-19 ENCOUNTER — Other Ambulatory Visit: Payer: Self-pay | Admitting: Emergency Medicine

## 2013-03-19 ENCOUNTER — Ambulatory Visit (INDEPENDENT_AMBULATORY_CARE_PROVIDER_SITE_OTHER): Payer: Medicare Other

## 2013-03-19 DIAGNOSIS — E782 Mixed hyperlipidemia: Secondary | ICD-10-CM

## 2013-03-19 DIAGNOSIS — I10 Essential (primary) hypertension: Secondary | ICD-10-CM

## 2013-03-19 DIAGNOSIS — R7309 Other abnormal glucose: Secondary | ICD-10-CM

## 2013-03-19 LAB — HEMOGLOBIN A1C
Hgb A1c MFr Bld: 5.7 % — ABNORMAL HIGH (ref <5.7)
Mean Plasma Glucose: 117 mg/dL — ABNORMAL HIGH (ref <117)

## 2013-03-19 LAB — CBC WITH DIFFERENTIAL/PLATELET
BASOS ABS: 0 10*3/uL (ref 0.0–0.1)
BASOS PCT: 0 % (ref 0–1)
EOS PCT: 0 % (ref 0–5)
Eosinophils Absolute: 0 10*3/uL (ref 0.0–0.7)
HEMATOCRIT: 36.1 % (ref 36.0–46.0)
HEMOGLOBIN: 12.1 g/dL (ref 12.0–15.0)
LYMPHS PCT: 33 % (ref 12–46)
Lymphs Abs: 2 10*3/uL (ref 0.7–4.0)
MCH: 25.1 pg — ABNORMAL LOW (ref 26.0–34.0)
MCHC: 33.5 g/dL (ref 30.0–36.0)
MCV: 74.7 fL — AB (ref 78.0–100.0)
MONO ABS: 0.4 10*3/uL (ref 0.1–1.0)
MONOS PCT: 6 % (ref 3–12)
Neutro Abs: 3.8 10*3/uL (ref 1.7–7.7)
Neutrophils Relative %: 61 % (ref 43–77)
Platelets: 357 10*3/uL (ref 150–400)
RBC: 4.83 MIL/uL (ref 3.87–5.11)
RDW: 16.2 % — AB (ref 11.5–15.5)
WBC: 6.2 10*3/uL (ref 4.0–10.5)

## 2013-03-19 LAB — HEPATIC FUNCTION PANEL
ALT: 21 U/L (ref 0–35)
AST: 21 U/L (ref 0–37)
Albumin: 4.6 g/dL (ref 3.5–5.2)
Alkaline Phosphatase: 99 U/L (ref 39–117)
BILIRUBIN DIRECT: 0.1 mg/dL (ref 0.0–0.3)
BILIRUBIN TOTAL: 0.4 mg/dL (ref 0.2–1.2)
Indirect Bilirubin: 0.3 mg/dL (ref 0.2–1.2)
Total Protein: 7.7 g/dL (ref 6.0–8.3)

## 2013-03-19 LAB — LIPID PANEL
CHOL/HDL RATIO: 3.8 ratio
CHOLESTEROL: 259 mg/dL — AB (ref 0–200)
HDL: 68 mg/dL (ref >39)
LDL Cholesterol: 154 mg/dL — ABNORMAL HIGH (ref 0–99)
TRIGLYCERIDES: 184 mg/dL — AB (ref <150)
VLDL: 37 mg/dL (ref 0–40)

## 2013-03-19 LAB — BASIC METABOLIC PANEL WITH GFR
BUN: 12 mg/dL (ref 6–23)
CO2: 27 mEq/L (ref 19–32)
CREATININE: 0.61 mg/dL (ref 0.50–1.10)
Calcium: 9.6 mg/dL (ref 8.4–10.5)
Chloride: 104 mEq/L (ref 96–112)
GFR, EST NON AFRICAN AMERICAN: 89 mL/min
Glucose, Bld: 98 mg/dL (ref 70–99)
Potassium: 3.7 mEq/L (ref 3.5–5.3)
Sodium: 140 mEq/L (ref 135–145)

## 2013-03-19 NOTE — Progress Notes (Signed)
Patient ID: Stephanie Roach, female   DOB: 1935/09/30, 78 y.o.   MRN: [id number] Patient here today for recheck on labs.

## 2013-03-20 ENCOUNTER — Other Ambulatory Visit: Payer: Self-pay | Admitting: Emergency Medicine

## 2013-03-20 LAB — INSULIN, FASTING: Insulin fasting, serum: 75 u[IU]/mL — ABNORMAL HIGH (ref 3–28)

## 2013-03-20 MED ORDER — PRAVASTATIN SODIUM 40 MG PO TABS: 40.0000 mg | ORAL_TABLET | Freq: Every evening | ORAL | Status: DC

## 2013-05-03 ENCOUNTER — Ambulatory Visit: Payer: Self-pay | Admitting: Internal Medicine

## 2013-05-18 ENCOUNTER — Other Ambulatory Visit: Payer: Self-pay | Admitting: Dermatology

## 2013-05-24 ENCOUNTER — Encounter: Payer: Self-pay | Admitting: Internal Medicine

## 2013-05-24 ENCOUNTER — Ambulatory Visit (INDEPENDENT_AMBULATORY_CARE_PROVIDER_SITE_OTHER): Payer: Medicare Other | Admitting: Internal Medicine

## 2013-05-24 VITALS — BP 106/78 | HR 84 | Temp 97.6°F | Resp 16 | Ht 65.0 in | Wt 146.0 lb

## 2013-05-24 DIAGNOSIS — Z79899 Other long term (current) drug therapy: Secondary | ICD-10-CM

## 2013-05-24 DIAGNOSIS — E559 Vitamin D deficiency, unspecified: Secondary | ICD-10-CM

## 2013-05-24 DIAGNOSIS — F411 Generalized anxiety disorder: Secondary | ICD-10-CM | POA: Insufficient documentation

## 2013-05-24 DIAGNOSIS — E785 Hyperlipidemia, unspecified: Secondary | ICD-10-CM

## 2013-05-24 DIAGNOSIS — R7303 Prediabetes: Secondary | ICD-10-CM

## 2013-05-24 DIAGNOSIS — R7309 Other abnormal glucose: Secondary | ICD-10-CM

## 2013-05-24 DIAGNOSIS — I10 Essential (primary) hypertension: Secondary | ICD-10-CM

## 2013-05-24 LAB — HEPATIC FUNCTION PANEL
ALBUMIN: 4.3 g/dL (ref 3.5–5.2)
ALK PHOS: 81 U/L (ref 39–117)
ALT: 9 U/L (ref 0–35)
AST: 14 U/L (ref 0–37)
BILIRUBIN TOTAL: 0.3 mg/dL (ref 0.2–1.2)
Bilirubin, Direct: 0.1 mg/dL (ref 0.0–0.3)
Indirect Bilirubin: 0.2 mg/dL (ref 0.2–1.2)
Total Protein: 7.1 g/dL (ref 6.0–8.3)

## 2013-05-24 LAB — LIPID PANEL
Cholesterol: 231 mg/dL — ABNORMAL HIGH (ref 0–200)
HDL: 70 mg/dL (ref >39)
LDL CALC: 128 mg/dL — AB (ref 0–99)
Total CHOL/HDL Ratio: 3.3 Ratio
Triglycerides: 165 mg/dL — ABNORMAL HIGH (ref <150)
VLDL: 33 mg/dL (ref 0–40)

## 2013-05-24 MED ORDER — RANITIDINE HCL 300 MG PO TABS: 300.0000 mg | ORAL_TABLET | Freq: Every day | ORAL | Status: DC

## 2013-05-24 NOTE — Patient Instructions (Signed)

## 2013-05-24 NOTE — Progress Notes (Signed)
   Subjective:    Patient ID: Stephanie Roach, female    DOB: 1935-03-21, 78 y.o.   MRN: [id number]  HPI Very nice 78  Yo  MWF being seen in f/u of starting Pravastatin for abnormal Lipid profile and elevated Cholesterol as follows  Lab Results  Component Value Date   CHOL 259* 03/19/2013   HDL 68 03/19/2013   LDLCALC 154* 03/19/2013   TRIG 184* 03/19/2013   CHOLHDL 3.8 03/19/2013  Patient did not start Pravastatin as recommended and attempted diet to avoid medications if possible.  Medication Sig  . calcium carbonate 200 MG cap Take 250 mg by mouth daily.    . cholecalciferol (VITAMIN D) 1000 U  Take 5,000 Units by mouth daily.  Valinda Hoar CAPS Take 1 capsule by mouth daily.  . fish oil-omega-3 fatty acids 1000 MG capsule Take 2 g by mouth daily.  Marland Kitchen LORazepam  1 MG tablet Take 1 mg by mouth every 8 (eight) hours.    Marland Kitchen omeprazole (PRILOSEC) 20 MG capsule Take 20 mg by mouth daily.  . pravastatin (PRAVACHOL) 40 MG tablet Take 1 tablet (40 mg total) by mouth every evening. ** not taking **  . traZODone (DESYREL) 50 MG tablet   . venlafaxine (EFFEXOR-XR) 75 MG 24 hr  Take 75 mg by mouth daily.     Allergies  Allergen Reactions  . Morphine And Related    Past Medical History  Diagnosis Date  . Anemia   . Hyperlipidemia   . Arthritis   . Anxiety   . Depression   . Allergic rhinitis   . Sleep apnea   . Hypertension   . Prediabetes   . Migraine   . GERD (gastroesophageal reflux disease)   . Vitamin D deficiency    Review of Systems In addition to the HPI above,  No Fever-chills,  No Headache, No changes with Vision or hearing,  No problems swallowing food or Liquids,  No Chest pain or productive Cough or Shortness of Breath,  No Abdominal pain, No Nausea or Vommitting, Bowel movements are regular,  No Blood in stool or Urine,  No dysuria,  No new skin rashes or bruises,  No new joints pains-aches,  No new weakness, tingling, numbness in any extremity,  No recent weight  loss,  No polyuria, polydypsia or polyphagia,  No significant Mental Stressors.  A full 10 point Review of Systems was done, except as stated above, all other Review of Systems were negative  Objective:   Physical Exam  BP 106/78  Pulse 84  Temp(Src) 97.6 F (36.4 C) (Temporal)  Resp 16  Ht 5\' 5"  (1.651 m)  Wt 146 lb (66.225 kg)  BMI 24.30 kg/m2  HEENT - Eac's patent. TM's Nl.EOM's full. PERRLA. NasoOroPharynx clear. Neck - supple. Nl Thyroid. No bruits nodes JVD Chest - Clear equal BS Cor - Nl HS. RRR w/o sig MGR. PP 1(+) No edema. Abd - No palpable organomegaly, masses or tenderness. BS nl. MS- FROM. w/o deformities. Muscle power tone and bulk Nl. Gait Nl. Neuro - No obvious Cr N abnormalities. Sensory, motor and Cerebellar functions appear Nl w/o focal abnormalities.  Assessment & Plan:   1. Hypertension  2. Hyperlipidemia - Lipid Profile and HFP  3. Prediabetes  4. Vitamin D deficiency  5. Encounter for long-term (current) use of other medications

## 2013-05-29 ENCOUNTER — Other Ambulatory Visit: Payer: Self-pay | Admitting: *Deleted

## 2013-05-29 MED ORDER — PRAVASTATIN SODIUM 40 MG PO TABS: 40.0000 mg | ORAL_TABLET | Freq: Every evening | ORAL | Status: DC

## 2013-06-22 ENCOUNTER — Other Ambulatory Visit: Payer: Self-pay | Admitting: Dermatology

## 2013-08-16 ENCOUNTER — Encounter: Payer: Self-pay | Admitting: Physician Assistant

## 2013-08-24 NOTE — Progress Notes (Signed)
MEDICARE ANNUAL WELLNESS VISIT AND CPE  Assessment:   1. Essential hypertension - CBC with Differential - BASIC METABOLIC PANEL WITH GFR - Hepatic function panel - TSH - EKG 12-Lead - [place], RETROPERITNL ABD,  LTD  2. Prediabetes Discussed general issues about diabetes pathophysiology and management., Educational material distributed., Suggested low cholesterol diet., Encouraged aerobic exercise., Discussed foot care., Reminded to get yearly retinal exam. - Hemoglobin A1c - HM DIABETES FOOT EXAM  3. Prevnar 13  4. Hyperlipidemia - Lipid panel - Urinalysis, Routine w reflex microscopic - Microalbumin / creatinine urine ratio  5. Vitamin D deficiency - Vit D  25 hydroxy (rtn osteoporosis monitoring)  6. Estrogen deficiency  - DG Bone Density; Future- will get with 3D MGM  7. Screening for rectal cancer - POC Hemoccult Bld/Stl (3-Cd Home Screen); Future  8. TMJ -information given to the patient, no gum/decrease hard foods, warm wet wash clothes, decrease stress, talk with dentist about possible night guard, can do massage, and exercise.   9. Nonexertional SOB with GERD symptoms, EKG normal-  try dexilant samples, if not better may refer to cardio, if any CP, worseing SOB go to the ER  10. Depression, + screening - continue medications, suggest getting counseling, names given, no suicidal/homicidal ideations.     Plan:   During the course of the visit the patient was educated and counseled about appropriate screening and preventive services including:    Pneumococcal vaccine   Influenza vaccine  Td vaccine  Screening electrocardiogram  Screening mammography  Bone densitometry screening  Colorectal cancer screening  Diabetes screening  Glaucoma screening  Nutrition counseling   Advanced directives: given information/requested  Screening recommendations, referrals:  Vaccinations: Tdap vaccine not indicated Influenza vaccine requested Pneumococcal  vaccine requested Shingles vaccine not indicated Hep B vaccine not indicated  Nutrition assessed and recommended  Colonoscopy due 2017 Mammogram requested Pap smear not indicated Pelvic exam not indicated Recommended yearly ophthalmology/optometry visit for glaucoma screening and checkup Recommended yearly dental visit for hygiene and checkup Advanced directives - requested  Conditions/risks identified: BMI: Discussed weight loss, diet, and increase physical activity.  Increase physical activity: AHA recommends 150 minutes of physical activity a week.  Medications reviewed DEXA- ordered Urinary Incontinence is not an issue: discussed non pharmacology and pharmacology options.  Fall risk: low- discussed PT, home fall assessment, medications.   Subjective:   Stephanie Roach is a 78 y.o. female who presents for Medicare Annual Wellness Visit and complete physical.    Date of last medicare wellness visit is unknown.   Her blood pressure has been controlled at home, today their BP is BP: 110/72 mmHg She does not workout, she did some walking while in [place] recently visiting her daughter in [place]. She denies chest pain, shortness of breath, dizziness.  She is on cholesterol medication and denies myalgias. Her cholesterol is at goal. The cholesterol last visit was:   Lab Results  Component Value Date   CHOL 231* 05/24/2013   HDL 70 05/24/2013   LDLCALC 128* 05/24/2013   TRIG 165* 05/24/2013   CHOLHDL 3.3 05/24/2013    She has been working on diet and exercise for prediabetes, and denies polydipsia, polyuria and visual disturbances. Last A1C in the office was:  Lab Results  Component Value Date   HGBA1C 5.7* 03/19/2013   Patient is on Vitamin D supplement.   Lab Results  Component Value Date   VD25OH 39 12/15/2012     Nonexertional SOB, or having a hard time  getting deep breaths, has GERD symptoms.   Names of Other Physician/Practitioners you currently use: 1. [facility]- here for primary care 2. Dr. Katy Fitch, eye doctor, last visit 3 months 3. Dr. Osa Craver, dentist, last visit 6 months Patient Care Team: Unk Pinto, MD as PCP - General (Internal Medicine) Lafayette Dragon, MD as Consulting Physician (Gastroenterology) Norma Fredrickson, MD as Consulting Physician (Psychiatry) V Hiram Comber, MD as Consulting Physician (Orthopedic Surgery)   Medication Review Current Outpatient Prescriptions on File Prior to Visit  Medication Sig Dispense Refill  . calcium carbonate 200 MG capsule Take 250 mg by mouth daily.        . cholecalciferol (VITAMIN D) 1000 UNITS tablet Take 5,000 Units by mouth daily.      . Fe Fum-FePoly-FA-Vit C-Vit B3 (INTEGRA F) 125-1 MG CAPS Take 1 capsule by mouth daily.  30 capsule  1  . fish oil-omega-3 fatty acids 1000 MG capsule Take 2 g by mouth daily.      Marland Kitchen LORazepam (ATIVAN) 1 MG tablet Take 1 mg by mouth every 8 (eight) hours.        Marland Kitchen omeprazole (PRILOSEC) 20 MG capsule Take 20 mg by mouth daily.      . pravastatin (PRAVACHOL) 40 MG tablet Take 1 tablet (40 mg total) by mouth every evening.  30 tablet  3  . ranitidine (ZANTAC) 300 MG tablet Take 1 tablet (300 mg total) by mouth at bedtime. To prevent Heart Burn  90 tablet  99  . traZODone (DESYREL) 50 MG tablet       . venlafaxine (EFFEXOR-XR) 75 MG 24 hr capsule Take 75 mg by mouth daily.         Current Facility-Administered Medications on File Prior to Visit  Medication Dose Route Frequency Provider Last Rate Last Dose  . 0.9 %  sodium chloride infusion  500 mL Intravenous Continuous Lafayette Dragon, MD        Current Problems (verified) Patient Active Problem List   Diagnosis Date Noted  . Encounter for long-term (current) use of other medications 05/24/2013  . Prediabetes   . Hyperlipidemia   . Hypertension   . Vitamin D deficiency   . Family hx of colon cancer 08/11/2010  . Hx of adenomatous colonic polyps 08/11/2010  . Iron deficiency  anemia 08/11/2010  . Chronic anxiety 08/11/2010    Screening Tests Health Maintenance  Topic Date Due  . Pneumococcal Polysaccharide Vaccine Age 58 And Over  12/22/2002  . Influenza Vaccine  08/25/2013  . Mammogram  10/20/2014  . Colonoscopy  08/27/2015  . Tetanus/tdap  08/06/2022  . Zostavax  Completed    Immunization History  Administered Date(s) Administered  . Pneumococcal-Unspecified 12/06/2001  . Tdap 08/05/2012  . Zoster 02/05/2010    Preventative care: Last colonoscopy: 2012 due 2017 Last mammogram: 09/2012 due 09/2013 Last pap smear/pelvic exam: 2012  DEXA:2011 DUE  Prior vaccinations: TD or Tdap: 2014  Influenza: declines Prevnar 13: DUE Pneumococcal: 2003 Shingles/Zostavax: 2012  History reviewed: allergies, current medications, past family history, past medical history, past social history, past surgical history and problem list  Risk Factors: Osteoporosis: postmenopausal estrogen deficiency and dietary calcium and/or vitamin D deficiency History of fracture in the past year: no  Tobacco History  Substance Use Topics  . Smoking status: Never Smoker   . Smokeless tobacco: Never Used  . Alcohol Use: No   She does not smoke.  Patient is not a former smoker. Are there  smokers in your home (other than you)?  No  Alcohol Current alcohol use: none  Caffeine Current caffeine use: caffeinated soft drinks 1 /day  Exercise  Current exercise: none  Nutrition/Diet Current diet: in general, a "healthy" diet    Cardiac risk factors: advanced age (older than 73 for men, 63 for women), dyslipidemia, hypertension and sedentary lifestyle.  Depression Screen (Note: if answer to either of the following is "Yes", a more complete depression screening is indicated)   Q1: Over the past two weeks, have you felt down, depressed or hopeless? Yes  Q2: Over the past two weeks, have you felt little interest or pleasure in doing things? No  Have you lost interest or  pleasure in daily life? No  Do you often feel hopeless? No  Do you cry easily over simple problems? No  Activities of Daily Living In your present state of health, do you have any difficulty performing the following activities?:  Driving? No Managing money?  No Feeding yourself? No Getting from bed to chair? No Climbing a flight of stairs? No Preparing food and eating?: No Bathing or showering? No Getting dressed: No Getting to the toilet? No Using the toilet:No Moving around from place to place: No In the past year have you fallen or had a near fall?:No   Are you sexually active?  No  Do you have more than one partner?  No  Vision Difficulties: No  Hearing Difficulties: Yes Do you often ask people to speak up or repeat themselves? Yes Do you experience ringing or noises in your ears? No Do you have difficulty understanding soft or whispered voices? No  Cognition  Do you feel that you have a problem with memory?Yes  Do you often misplace items? No  Do you feel safe at home?  Yes  Advanced directives Does patient have a [place]? Yes Does patient have a Living Will? Yes   Objective:     Blood pressure 110/72, pulse 76, temperature 98.2 F (36.8 C), resp. rate 16, height 5\' 5"  (1.651 m), weight 148 lb (67.132 kg). Body mass index is 24.63 kg/(m^2).  General appearance: alert, no distress, WD/WN,  female Cognitive Testing  Alert? Yes  Normal Appearance?Yes  Oriented to person? Yes  Place? Yes   Time? Yes  Recall of three objects?  Yes  Can perform simple calculations? Yes  Displays appropriate judgment?Yes  Can read the correct time from a watch face?Yes  HEENT: normocephalic, sclerae anicteric, TMs pearly, nares patent, no discharge or erythema, pharynx normal Oral cavity: MMM, no lesions Neck: supple, no lymphadenopathy, no thyromegaly, no masses, + TMJ tenderness Heart: RRR, normal S1, S2, no murmurs Lungs: CTA bilaterally, no wheezes,  rhonchi, or rales Abdomen: +bs, soft, non tender, non distended, no masses, no hepatomegaly, no splenomegaly Musculoskeletal: nontender, no swelling, no obvious deformity Extremities: no edema, no cyanosis, no clubbing Pulses: 2+ symmetric, upper and lower extremities, normal cap refill Neurological: alert, oriented x 3, CN2-12 intact, strength normal upper extremities and lower extremities, sensation normal throughout, DTRs 2+ throughout, no cerebellar signs, gait normal Psychiatric: normal affect, behavior normal, pleasant  Breast:  nontender, no masses or lumps, no skin changes, no nipple discharge or inversion, no axillary lymphadenopathy Gyn: defer  Rectal: defer  Medicare Attestation I have personally reviewed: The patient's medical and social history Their use of alcohol, tobacco or illicit drugs Their current medications and supplements The patient's functional ability including ADLs,fall risks, home safety risks, cognitive, and  hearing and visual impairment Diet and physical activities Evidence for depression or mood disorders  The patient's weight, height, BMI, and visual acuity have been recorded in the chart.  I have made referrals, counseling, and provided education to the patient based on review of the above and I have provided the patient with a written personalized care plan for preventive services.     Vicie Mutters, PA-C   08/27/2013

## 2013-08-27 ENCOUNTER — Other Ambulatory Visit: Payer: Self-pay | Admitting: Physician Assistant

## 2013-08-27 ENCOUNTER — Ambulatory Visit (INDEPENDENT_AMBULATORY_CARE_PROVIDER_SITE_OTHER): Payer: Medicare Other | Admitting: Physician Assistant

## 2013-08-27 ENCOUNTER — Encounter: Payer: Self-pay | Admitting: Physician Assistant

## 2013-08-27 VITALS — BP 110/72 | HR 76 | Temp 98.2°F | Resp 16 | Ht 65.0 in | Wt 148.0 lb

## 2013-08-27 DIAGNOSIS — E559 Vitamin D deficiency, unspecified: Secondary | ICD-10-CM

## 2013-08-27 DIAGNOSIS — Z0000 Encounter for general adult medical examination without abnormal findings: Secondary | ICD-10-CM

## 2013-08-27 DIAGNOSIS — F32A Depression, unspecified: Secondary | ICD-10-CM

## 2013-08-27 DIAGNOSIS — E2839 Other primary ovarian failure: Secondary | ICD-10-CM

## 2013-08-27 DIAGNOSIS — F329 Major depressive disorder, single episode, unspecified: Secondary | ICD-10-CM

## 2013-08-27 DIAGNOSIS — Z800 Family history of malignant neoplasm of digestive organs: Secondary | ICD-10-CM

## 2013-08-27 DIAGNOSIS — R7303 Prediabetes: Secondary | ICD-10-CM

## 2013-08-27 DIAGNOSIS — E785 Hyperlipidemia, unspecified: Secondary | ICD-10-CM

## 2013-08-27 DIAGNOSIS — Z1212 Encounter for screening for malignant neoplasm of rectum: Secondary | ICD-10-CM

## 2013-08-27 DIAGNOSIS — Z789 Other specified health status: Secondary | ICD-10-CM

## 2013-08-27 DIAGNOSIS — I10 Essential (primary) hypertension: Secondary | ICD-10-CM

## 2013-08-27 DIAGNOSIS — Z23 Encounter for immunization: Secondary | ICD-10-CM

## 2013-08-27 DIAGNOSIS — Z79899 Other long term (current) drug therapy: Secondary | ICD-10-CM

## 2013-08-27 LAB — CBC WITH DIFFERENTIAL/PLATELET
Basophils Absolute: 0.1 10*3/uL (ref 0.0–0.1)
Basophils Relative: 1 % (ref 0–1)
Eosinophils Absolute: 0 10*3/uL (ref 0.0–0.7)
Eosinophils Relative: 0 % (ref 0–5)
HCT: 32.1 % — ABNORMAL LOW (ref 36.0–46.0)
HEMOGLOBIN: 10.2 g/dL — AB (ref 12.0–15.0)
LYMPHS ABS: 1.4 10*3/uL (ref 0.7–4.0)
Lymphocytes Relative: 26 % (ref 12–46)
MCH: 21.8 pg — ABNORMAL LOW (ref 26.0–34.0)
MCHC: 31.8 g/dL (ref 30.0–36.0)
MCV: 68.6 fL — ABNORMAL LOW (ref 78.0–100.0)
MONOS PCT: 8 % (ref 3–12)
Monocytes Absolute: 0.4 10*3/uL (ref 0.1–1.0)
NEUTROS ABS: 3.6 10*3/uL (ref 1.7–7.7)
NEUTROS PCT: 65 % (ref 43–77)
Platelets: 375 10*3/uL (ref 150–400)
RBC: 4.68 MIL/uL (ref 3.87–5.11)
RDW: 17.3 % — ABNORMAL HIGH (ref 11.5–15.5)
WBC: 5.5 10*3/uL (ref 4.0–10.5)

## 2013-08-27 LAB — HEPATIC FUNCTION PANEL
ALT: 14 U/L (ref 0–35)
AST: 19 U/L (ref 0–37)
Albumin: 4.3 g/dL (ref 3.5–5.2)
Alkaline Phosphatase: 93 U/L (ref 39–117)
BILIRUBIN DIRECT: 0.1 mg/dL (ref 0.0–0.3)
BILIRUBIN TOTAL: 0.4 mg/dL (ref 0.2–1.2)
Indirect Bilirubin: 0.3 mg/dL (ref 0.2–1.2)
Total Protein: 6.9 g/dL (ref 6.0–8.3)

## 2013-08-27 LAB — BASIC METABOLIC PANEL WITH GFR
BUN: 10 mg/dL (ref 6–23)
CO2: 27 meq/L (ref 19–32)
Calcium: 8.9 mg/dL (ref 8.4–10.5)
Chloride: 104 mEq/L (ref 96–112)
Creat: 0.69 mg/dL (ref 0.50–1.10)
GFR, Est Non African American: 85 mL/min
Glucose, Bld: 110 mg/dL — ABNORMAL HIGH (ref 70–99)
Potassium: 4 mEq/L (ref 3.5–5.3)
SODIUM: 138 meq/L (ref 135–145)

## 2013-08-27 LAB — LIPID PANEL
CHOL/HDL RATIO: 3.3 ratio
CHOLESTEROL: 245 mg/dL — AB (ref 0–200)
HDL: 75 mg/dL (ref >39)
LDL CALC: 140 mg/dL — AB (ref 0–99)
TRIGLYCERIDES: 148 mg/dL (ref <150)
VLDL: 30 mg/dL (ref 0–40)

## 2013-08-27 NOTE — Patient Instructions (Signed)
What is the TMJ? The temporomandibular (tem-PUH-ro-man-DIB-yoo-ler) joint, or the TMJ, connects the upper and lower jawbones. This joint allows the jaw to open wide and move back and forth when you chew, talk, or yawn.There are also several muscles that help this joint move. There can be muscle tightness and pain in the muscle that can cause several symptoms.  What causes TMJ pain? There are many causes of TMJ pain. Repeated chewing (for example, chewing gum) and clenching your teeth can cause pain in the joint. Some TMJ pain has no obvious cause. What can I do to ease the pain? There are many things you can do to help your pain get better. When you have pain:  Eat soft foods and stay away from chewy foods (for example, taffy) Try to use both sides of your mouth to chew Don't chew gum Don't open your mouth wide (for example, during yawning or singing) Don't bite your cheeks or fingernails Lower your amount of stress and worry Applying a warm, damp washcloth to the joint may help. Over-the-counter pain medicines such as ibuprofen (one brand: Advil) or acetaminophen (one brand: Tylenol) might also help. Do not use these medicines if you are allergic to them or if your doctor told you not to use them. How can I stop the pain from coming back? When your pain is better, you can do these exercises to make your muscles stronger and to keep the pain from coming back:  Resisted mouth opening: Place your thumb or two fingers under your chin and open your mouth slowly, pushing up lightly on your chin with your thumb. Hold for three to six seconds. Close your mouth slowly. Resisted mouth closing: Place your thumbs under your chin and your two index fingers on the ridge between your mouth and the bottom of your chin. Push down lightly on your chin as you close your mouth. Tongue up: Slowly open and close your mouth while keeping the tongue touching the roof of the mouth. Side-to-side jaw movement: Place an  object about one fourth of an inch thick (for example, two tongue depressors) between your front teeth. Slowly move your jaw from side to side. Increase the thickness of the object as the exercise becomes easier Forward jaw movement: Place an object about one fourth of an inch thick between your front teeth and move the bottom jaw forward so that the bottom teeth are in front of the top teeth. Increase the thickness of the object as the exercise becomes easier. These exercises should not be painful. If it hurts to do these exercises, stop doing them and talk to your family doctor.   Preventative Care for Adults - Female      MAINTAIN REGULAR HEALTH EXAMS:  A routine yearly physical is a good way to check in with your primary care provider about your health and preventive screening. It is also an opportunity to share updates about your health and any concerns you have, and receive a thorough all-over exam.   Most health insurance companies pay for at least some preventative services.  Check with your health plan for specific coverages.  WHAT PREVENTATIVE SERVICES DO WOMEN NEED?  Adult women should have their weight and blood pressure checked regularly.   Women age 22 and older should have their cholesterol levels checked regularly.  Women should be screened for cervical cancer with a Pap smear and pelvic exam beginning at either age 11, or 3 years after they become sexually activity.  Breast cancer screening generally begins at age 48 with a mammogram and breast exam by your primary care provider.    Beginning at age 77 and continuing to age 1, women should be screened for colorectal cancer.  Certain people may need continued testing until age 20.  Updating vaccinations is part of preventative care.  Vaccinations help protect against diseases such as the flu.  Osteoporosis is a disease in which the bones lose minerals and strength as we age. Women ages 62 and over should discuss this  with their caregivers, as should women after menopause who have other risk factors.  Lab tests are generally done as part of preventative care to screen for anemia and blood disorders, to screen for problems with the kidneys and liver, to screen for bladder problems, to check blood sugar, and to check your cholesterol level.  Preventative services generally include counseling about diet, exercise, avoiding tobacco, drugs, excessive alcohol consumption, and sexually transmitted infections.    GENERAL RECOMMENDATIONS FOR GOOD HEALTH:  Healthy diet:  Eat a variety of foods, including fruit, vegetables, animal or vegetable protein, such as meat, fish, chicken, and eggs, or beans, lentils, tofu, and grains, such as rice.  Drink plenty of water daily.  Decrease saturated fat in the diet, avoid lots of red meat, processed foods, sweets, fast foods, and fried foods.  Exercise:  Aerobic exercise helps maintain good heart health. At least 30-40 minutes of moderate-intensity exercise is recommended. For example, a brisk walk that increases your heart rate and breathing. This should be done on most days of the week.   Find a type of exercise or a variety of exercises that you enjoy so that it becomes a part of your daily life.  Examples are running, walking, swimming, water aerobics, and biking.  For motivation and support, explore group exercise such as aerobic class, spin class, Zumba, Yoga,or  martial arts, etc.    Set exercise goals for yourself, such as a certain weight goal, walk or run in a race such as a 5k walk/run.  Speak to your primary care provider about exercise goals.  Disease prevention:  If you smoke or chew tobacco, find out from your caregiver how to quit. It can literally save your life, no matter how long you have been a tobacco user. If you do not use tobacco, never begin.   Maintain a healthy diet and normal weight. Increased weight leads to problems with blood pressure and  diabetes.   The Body Mass Index or BMI is a way of measuring how much of your body is fat. Having a BMI above 27 increases the risk of heart disease, diabetes, hypertension, stroke and other problems related to obesity. Your caregiver can help determine your BMI and based on it develop an exercise and dietary program to help you achieve or maintain this important measurement at a healthful level.  High blood pressure causes heart and blood vessel problems.  Persistent high blood pressure should be treated with medicine if weight loss and exercise do not work.   Fat and cholesterol leaves deposits in your arteries that can block them. This causes heart disease and vessel disease elsewhere in your body.  If your cholesterol is found to be high, or if you have heart disease or certain other medical conditions, then you may need to have your cholesterol monitored frequently and be treated with medication.   Ask if you should have a cardiac stress test if your history suggests this. A stress  test is a test done on a treadmill that looks for heart disease. This test can find disease prior to there being a problem.  Menopause can be associated with physical symptoms and risks. Hormone replacement therapy is available to decrease these. You should talk to your caregiver about whether starting or continuing to take hormones is right for you.   Osteoporosis is a disease in which the bones lose minerals and strength as we age. This can result in serious bone fractures. Risk of osteoporosis can be identified using a bone density scan. Women ages 8 and over should discuss this with their caregivers, as should women after menopause who have other risk factors. Ask your caregiver whether you should be taking a calcium supplement and Vitamin D, to reduce the rate of osteoporosis.   Avoid drinking alcohol in excess (more than two drinks per day).  Avoid use of street drugs. Do not share needles with anyone. Ask for  professional help if you need assistance or instructions on stopping the use of alcohol, cigarettes, and/or drugs.  Brush your teeth twice a day with fluoride toothpaste, and floss once a day. Good oral hygiene prevents tooth decay and gum disease. The problems can be painful, unattractive, and can cause other health problems. Visit your dentist for a routine oral and dental check up and preventive care every 6-12 months.   Look at your skin regularly.  Use a mirror to look at your back. Notify your caregivers of changes in moles, especially if there are changes in shapes, colors, a size larger than a pencil eraser, an irregular border, or development of new moles.  Safety:  Use seatbelts 100% of the time, whether driving or as a passenger.  Use safety devices such as hearing protection if you work in environments with loud noise or significant background noise.  Use safety glasses when doing any work that could send debris in to the eyes.  Use a helmet if you ride a bike or motorcycle.  Use appropriate safety gear for contact sports.  Talk to your caregiver about gun safety.  Use sunscreen with a SPF (or skin protection factor) of 15 or greater.  Lighter skinned people are at a greater risk of skin cancer. Don't forget to also wear sunglasses in order to protect your eyes from too much damaging sunlight. Damaging sunlight can accelerate cataract formation.   Practice safe sex. Use condoms. Condoms are used for birth control and to help reduce the spread of sexually transmitted infections (or STIs).  Some of the STIs are gonorrhea (the clap), chlamydia, syphilis, trichomonas, herpes, HPV (human papilloma virus) and HIV (human immunodeficiency virus) which causes AIDS. The herpes, HIV and HPV are viral illnesses that have no cure. These can result in disability, cancer and death.   Keep carbon monoxide and smoke detectors in your home functioning at all times. Change the batteries every 6 months or use  a model that plugs into the wall.   Vaccinations:  Stay up to date with your tetanus shots and other required immunizations. You should have a booster for tetanus every 10 years. Be sure to get your flu shot every year, since 5%-20% of the [place] population comes down with the flu. The flu vaccine changes each year, so being vaccinated once is not enough. Get your shot in the fall, before the flu season peaks.   Other vaccines to consider:  Human Papilloma Virus or HPV causes cancer of the cervix, and other infections that can  be transmitted from person to person. There is a vaccine for HPV, and females should get immunized between the ages of 65 and 43. It requires a series of 3 shots.   Pneumococcal vaccine to protect against certain types of pneumonia.  This is normally recommended for adults age 23 or older.  However, adults younger than 76 years old with certain underlying conditions such as diabetes, heart or lung disease should also receive the vaccine.  Shingles vaccine to protect against Varicella Zoster if you are older than age 50, or younger than 76 years old with certain underlying illness.  Hepatitis A vaccine to protect against a form of infection of the liver by a virus acquired from food.  Hepatitis B vaccine to protect against a form of infection of the liver by a virus acquired from blood or body fluids, particularly if you work in health care.  If you plan to travel internationally, check with your local health department for specific vaccination recommendations.  Cancer Screening:  Breast cancer screening is essential to preventive care for women. All women age 69 and older should perform a breast self-exam every month. At age 86 and older, women should have their caregiver complete a breast exam each year. Women at ages 33 and older should have a mammogram (x-ray film) of the breasts. Your caregiver can discuss how often you need mammograms.    Cervical cancer screening  includes taking a Pap smear (sample of cells examined under a microscope) from the cervix (end of the uterus). It also includes testing for HPV (Human Papilloma Virus, which can cause cervical cancer). Screening and a pelvic exam should begin at age 73, or 3 years after a woman becomes sexually active. Screening should occur every year, with a Pap smear but no HPV testing, up to age 42. After age 39, you should have a Pap smear every 3 years with HPV testing, if no HPV was found previously.   Most routine colon cancer screening begins at the age of 59. On a yearly basis, doctors may provide special easy to use take-home tests to check for hidden blood in the stool. Sigmoidoscopy or colonoscopy can detect the earliest forms of colon cancer and is life saving. These tests use a small camera at the end of a tube to directly examine the colon. Speak to your caregiver about this at age 16, when routine screening begins (and is repeated every 5 years unless early forms of pre-cancerous polyps or small growths are found).

## 2013-08-28 LAB — VITAMIN D 25 HYDROXY (VIT D DEFICIENCY, FRACTURES): Vit D, 25-Hydroxy: 45 ng/mL (ref 30–89)

## 2013-08-28 LAB — URINALYSIS, ROUTINE W REFLEX MICROSCOPIC
Bilirubin Urine: NEGATIVE
GLUCOSE, UA: NEGATIVE mg/dL
Hgb urine dipstick: NEGATIVE
Ketones, ur: NEGATIVE mg/dL
Leukocytes, UA: NEGATIVE
Nitrite: NEGATIVE
PH: 5 (ref 5.0–8.0)
Protein, ur: NEGATIVE mg/dL
SPECIFIC GRAVITY, URINE: 1.019 (ref 1.005–1.030)
UROBILINOGEN UA: 0.2 mg/dL (ref 0.0–1.0)

## 2013-08-28 LAB — IRON AND TIBC
%SAT: 5 % — AB (ref 20–55)
Iron: 26 ug/dL — ABNORMAL LOW (ref 42–145)
TIBC: 487 ug/dL — ABNORMAL HIGH (ref 250–470)
UIBC: 461 ug/dL — AB (ref 125–400)

## 2013-08-28 LAB — TSH: TSH: 1.048 u[IU]/mL (ref 0.350–4.500)

## 2013-08-28 LAB — VITAMIN B12: Vitamin B-12: 260 pg/mL (ref 211–911)

## 2013-08-28 LAB — HEMOGLOBIN A1C
Hgb A1c MFr Bld: 6.2 % — ABNORMAL HIGH (ref <5.7)
MEAN PLASMA GLUCOSE: 131 mg/dL — AB (ref <117)

## 2013-08-28 LAB — FERRITIN: Ferritin: 5 ng/mL — ABNORMAL LOW (ref 10–291)

## 2013-08-28 LAB — MICROALBUMIN / CREATININE URINE RATIO
Creatinine, Urine: 168.5 mg/dL
MICROALB UR: 1.42 mg/dL (ref 0.00–1.89)
Microalb Creat Ratio: 8.4 mg/g (ref 0.0–30.0)

## 2013-09-06 ENCOUNTER — Other Ambulatory Visit: Payer: Self-pay | Admitting: *Deleted

## 2013-09-06 DIAGNOSIS — Z1212 Encounter for screening for malignant neoplasm of rectum: Secondary | ICD-10-CM

## 2013-09-06 LAB — POC HEMOCCULT BLD/STL (HOME/3-CARD/SCREEN)
Card #2 Fecal Occult Blod, POC: NEGATIVE
Card #3 Fecal Occult Blood, POC: NEGATIVE
Fecal Occult Blood, POC: NEGATIVE

## 2013-09-11 ENCOUNTER — Ambulatory Visit (INDEPENDENT_AMBULATORY_CARE_PROVIDER_SITE_OTHER): Payer: Medicare Other | Admitting: *Deleted

## 2013-09-11 DIAGNOSIS — D649 Anemia, unspecified: Secondary | ICD-10-CM

## 2013-09-11 LAB — CBC WITH DIFFERENTIAL/PLATELET
BASOS PCT: 0 % (ref 0–1)
Basophils Absolute: 0 10*3/uL (ref 0.0–0.1)
EOS ABS: 0 10*3/uL (ref 0.0–0.7)
Eosinophils Relative: 0 % (ref 0–5)
HCT: 30.6 % — ABNORMAL LOW (ref 36.0–46.0)
Hemoglobin: 9.5 g/dL — ABNORMAL LOW (ref 12.0–15.0)
Lymphocytes Relative: 32 % (ref 12–46)
Lymphs Abs: 1.4 10*3/uL (ref 0.7–4.0)
MCH: 21.3 pg — ABNORMAL LOW (ref 26.0–34.0)
MCHC: 31 g/dL (ref 30.0–36.0)
MCV: 68.6 fL — ABNORMAL LOW (ref 78.0–100.0)
Monocytes Absolute: 0.4 10*3/uL (ref 0.1–1.0)
Monocytes Relative: 8 % (ref 3–12)
NEUTROS PCT: 60 % (ref 43–77)
Neutro Abs: 2.7 10*3/uL (ref 1.7–7.7)
Platelets: 365 10*3/uL (ref 150–400)
RBC: 4.46 MIL/uL (ref 3.87–5.11)
RDW: 16.3 % — ABNORMAL HIGH (ref 11.5–15.5)
WBC: 4.5 10*3/uL (ref 4.0–10.5)

## 2013-09-11 LAB — IRON AND TIBC
%SAT: 7 % — AB (ref 20–55)
IRON: 35 ug/dL — AB (ref 42–145)
TIBC: 509 ug/dL — ABNORMAL HIGH (ref 250–470)
UIBC: 474 ug/dL — ABNORMAL HIGH (ref 125–400)

## 2013-09-11 LAB — FERRITIN: Ferritin: 7 ng/mL — ABNORMAL LOW (ref 10–291)

## 2013-09-11 NOTE — Progress Notes (Signed)
Patient ID: Stephanie Roach, female   DOB: 1935-02-15, 78 y.o.   MRN: [id number] Patient states she has not started iron as of today.

## 2013-09-12 NOTE — Addendum Note (Signed)
Addended by: Vicie Mutters R on: 09/12/2013 08:40 AM   Modules accepted: Orders

## 2013-09-14 ENCOUNTER — Other Ambulatory Visit: Payer: Self-pay

## 2013-09-14 DIAGNOSIS — Z1231 Encounter for screening mammogram for malignant neoplasm of breast: Secondary | ICD-10-CM

## 2013-09-18 ENCOUNTER — Telehealth: Payer: Self-pay | Admitting: Internal Medicine

## 2013-09-18 NOTE — Telephone Encounter (Signed)
C/D 09/18/13 for appt.09/25/13

## 2013-09-18 NOTE — Telephone Encounter (Signed)
S/W PATIENT AND GAVE NP APPT FOR 09/01 @ 11 W/DR. MOHAMED.  REFERRING DR. Estill Bamberg COLLIER DX- ANEMIA

## 2013-09-19 ENCOUNTER — Encounter: Payer: Self-pay | Admitting: Physician Assistant

## 2013-09-25 ENCOUNTER — Other Ambulatory Visit: Payer: Self-pay | Admitting: Internal Medicine

## 2013-09-25 ENCOUNTER — Other Ambulatory Visit: Payer: Self-pay | Admitting: Medical Oncology

## 2013-09-25 ENCOUNTER — Encounter: Payer: Self-pay | Admitting: Internal Medicine

## 2013-09-25 ENCOUNTER — Other Ambulatory Visit (HOSPITAL_BASED_OUTPATIENT_CLINIC_OR_DEPARTMENT_OTHER): Payer: Medicare Other

## 2013-09-25 ENCOUNTER — Telehealth: Payer: Self-pay | Admitting: Internal Medicine

## 2013-09-25 ENCOUNTER — Ambulatory Visit: Payer: Medicare Other

## 2013-09-25 ENCOUNTER — Ambulatory Visit (HOSPITAL_BASED_OUTPATIENT_CLINIC_OR_DEPARTMENT_OTHER): Payer: Medicare Other | Admitting: Internal Medicine

## 2013-09-25 VITALS — BP 116/53 | HR 84 | Temp 98.1°F | Resp 18 | Ht 65.0 in | Wt 146.0 lb

## 2013-09-25 DIAGNOSIS — D539 Nutritional anemia, unspecified: Secondary | ICD-10-CM

## 2013-09-25 DIAGNOSIS — D509 Iron deficiency anemia, unspecified: Secondary | ICD-10-CM

## 2013-09-25 LAB — COMPREHENSIVE METABOLIC PANEL (CC13)
ALK PHOS: 79 U/L (ref 40–150)
ALT: 11 U/L (ref 0–55)
ANION GAP: 9 meq/L (ref 3–11)
AST: 14 U/L (ref 5–34)
Albumin: 3.7 g/dL (ref 3.5–5.0)
BUN: 14.8 mg/dL (ref 7.0–26.0)
CO2: 22 meq/L (ref 22–29)
Calcium: 9.1 mg/dL (ref 8.4–10.4)
Chloride: 110 mEq/L — ABNORMAL HIGH (ref 98–109)
Creatinine: 0.7 mg/dL (ref 0.6–1.1)
Glucose: 105 mg/dl (ref 70–140)
Potassium: 3.7 mEq/L (ref 3.5–5.1)
SODIUM: 141 meq/L (ref 136–145)
TOTAL PROTEIN: 7.3 g/dL (ref 6.4–8.3)
Total Bilirubin: <0.2 mg/dL (ref 0.20–1.20)

## 2013-09-25 LAB — IRON AND TIBC CHCC
%SAT: 42 % (ref 21–57)
Iron: 178 ug/dL — ABNORMAL HIGH (ref 41–142)
TIBC: 419 ug/dL (ref 236–444)
UIBC: 241 ug/dL (ref 120–384)

## 2013-09-25 LAB — CBC & DIFF AND RETIC
BASO%: 0.4 % (ref 0.0–2.0)
BASOS ABS: 0 10*3/uL (ref 0.0–0.1)
EOS ABS: 0 10*3/uL (ref 0.0–0.5)
EOS%: 0 % (ref 0.0–7.0)
HEMATOCRIT: 34.1 % — AB (ref 34.8–46.6)
HGB: 10.1 g/dL — ABNORMAL LOW (ref 11.6–15.9)
Immature Retic Fract: 15.9 % — ABNORMAL HIGH (ref 1.60–10.00)
LYMPH%: 27.3 % (ref 14.0–49.7)
MCH: 22.1 pg — AB (ref 25.1–34.0)
MCHC: 29.6 g/dL — ABNORMAL LOW (ref 31.5–36.0)
MCV: 74.6 fL — AB (ref 79.5–101.0)
MONO#: 0.4 10*3/uL (ref 0.1–0.9)
MONO%: 8 % (ref 0.0–14.0)
NEUT%: 64.3 % (ref 38.4–76.8)
NEUTROS ABS: 3.2 10*3/uL (ref 1.5–6.5)
PLATELETS: 355 10*3/uL (ref 145–400)
RBC: 4.57 10*6/uL (ref 3.70–5.45)
RDW: 19.6 % — ABNORMAL HIGH (ref 11.2–14.5)
Retic %: 1.92 % (ref 0.70–2.10)
Retic Ct Abs: 87.74 10*3/uL (ref 33.70–90.70)
WBC: 5 10*3/uL (ref 3.9–10.3)
lymph#: 1.4 10*3/uL (ref 0.9–3.3)

## 2013-09-25 LAB — FERRITIN CHCC: Ferritin: 22 ng/ml (ref 9–269)

## 2013-09-25 MED ORDER — INTEGRA F 125-1 MG PO CAPS: 1.0000 | ORAL_CAPSULE | Freq: Every day | ORAL | Status: DC

## 2013-09-25 NOTE — Progress Notes (Signed)
Checked in new patient with no financial issues prior to seeing the dr. She has appt card and has not been out of the country. °

## 2013-09-25 NOTE — Telephone Encounter (Signed)
, °

## 2013-09-25 NOTE — Progress Notes (Signed)
[place] [phone]   [phone]) [phone]  CONSULT NOTE  REFERRING PHYSICIAN: Vicie Mutters, PA-C  REASON FOR CONSULTATION:  78 years old white female with persistent iron deficiency.  HPI Stephanie Roach is a 78 y.o. female was past medical history significant for multiple medical problems including history of iron deficiency anemia, and exactly, dyslipidemia, hypertension, vitamin D deficiency, arthritis, depression and GERD. The patient mentions that she has a history of anemia or of her life. She recently quit eating red meat. She was seen by her primary care physician and was noted to have decline in her hemoglobin and hematocrit. CBC on 08/27/2013 showed hemoglobin of 10.1 hematocrit 32.1% with MCV of 68.6. Repeat CBC on 09/12/2013 showed hemoglobin was down to 9.5 and hematocrit 30.6% with MCV of 68.6. Iron study at that time showed ferritin level of 7, serum iron 35 total iron-binding capacity 509 with iron situation 7%. The patient was started on Slow Fe over the counter once daily with vitamin C. 2 weeks ago.  The stool for Hemoccult performed recently was negative. Her last colonoscopy was 8 years ago and upper endoscopy was performed 3 years ago with evidence for hiatal hernia. The patient was also treated for H. pylori in the past. She continues to complain of fatigue but no significant dizzy spells. She denied having any significant chest pain, shortness of breath, cough or hemoptysis. The patient denied having any significant weight loss or night sweats. She has no headache or visual changes. Family history significant for father with history of colon cancer at age 53, sister with lung cancer, brother died from stroke. The patient is married and has 3 children. She used to do clerical work. She has no history of smoking, alcohol or drug abuse.  HPI  Past Medical History  Diagnosis Date  . Anemia   . Hyperlipidemia   . Arthritis   . Anxiety     . Depression   . Allergic rhinitis   . Sleep apnea   . Hypertension   . Prediabetes   . Migraine   . GERD (gastroesophageal reflux disease)   . Vitamin D deficiency     Past Surgical History  Procedure Laterality Date  . Cholecystectomy  2003  . Knee arthroscopy    . Bunionectomy    . Wrist fracture surgery    . Cataract extraction Bilateral     Family History  Problem Relation Age of Onset  . Breast cancer Cousin   . Pancreatic cancer Paternal Aunt   . Ulcerative colitis Brother   . Colon polyps Maternal Aunt   . Colon cancer Father   . Stroke Mother     Social History History  Substance Use Topics  . Smoking status: Never Smoker   . Smokeless tobacco: Never Used  . Alcohol Use: No    Allergies  Allergen Reactions  . Morphine And Related     Current Outpatient Prescriptions  Medication Sig Dispense Refill  . calcium carbonate 200 MG capsule Take 250 mg by mouth daily.        . cholecalciferol (VITAMIN D) 1000 UNITS tablet Take 5,000 Units by mouth daily.      . Fe Fum-FePoly-FA-Vit C-Vit B3 (INTEGRA F) 125-1 MG CAPS Take 1 capsule by mouth daily.  30 capsule  1  . fish oil-omega-3 fatty acids 1000 MG capsule Take 2 g by mouth daily.      Marland Kitchen LORazepam (ATIVAN) 1 MG tablet Take 1 mg by  mouth every 8 (eight) hours.        Marland Kitchen omeprazole (PRILOSEC) 20 MG capsule Take 20 mg by mouth daily.      . pravastatin (PRAVACHOL) 40 MG tablet Take 1 tablet (40 mg total) by mouth every evening.  30 tablet  3  . ranitidine (ZANTAC) 300 MG tablet Take 1 tablet (300 mg total) by mouth at bedtime. To prevent Heart Burn  90 tablet  99  . traZODone (DESYREL) 50 MG tablet       . venlafaxine (EFFEXOR-XR) 75 MG 24 hr capsule Take 75 mg by mouth daily.         Current Facility-Administered Medications  Medication Dose Route Frequency Provider Last Rate Last Dose  . 0.9 %  sodium chloride infusion  500 mL Intravenous Continuous Lafayette Dragon, MD        Review of  Systems  Constitutional: positive for fatigue Eyes: negative Ears, nose, mouth, throat, and face: negative Respiratory: negative Cardiovascular: negative Gastrointestinal: negative Genitourinary:negative Integument/breast: negative Hematologic/lymphatic: negative Musculoskeletal:negative Neurological: negative Behavioral/Psych: negative Endocrine: negative Allergic/Immunologic: negative  Physical Exam  GEX:BMWUX, healthy, no distress, well nourished and well developed SKIN: skin color, texture, turgor are normal, no rashes or significant lesions HEAD: Normocephalic, No masses, lesions, tenderness or abnormalities EYES: normal, PERRLA EARS: External ears normal, Canals clear OROPHARYNX:no exudate and no erythema  NECK: supple, no adenopathy, no JVD LYMPH:  no palpable lymphadenopathy, no hepatosplenomegaly BREAST:not examined LUNGS: clear to auscultation , and palpation HEART: regular rate & rhythm, no murmurs and no gallops ABDOMEN:abdomen soft, non-tender, normal bowel sounds and no masses or organomegaly BACK: Back symmetric, no curvature., No CVA tenderness EXTREMITIES:no joint deformities, effusion, or inflammation, no edema, no skin discoloration  NEURO: alert & oriented x 3 with fluent speech, no focal motor/sensory deficits  PERFORMANCE STATUS: ECOG 1  LABORATORY DATA: Lab Results  Component Value Date   WBC 5.0 09/25/2013   HGB 10.1* 09/25/2013   HCT 34.1* 09/25/2013   MCV 74.6* 09/25/2013   PLT 355 09/25/2013      Chemistry      Component Value Date/Time   NA 141 09/25/2013 1103   NA 138 08/27/2013 1129   K 3.7 09/25/2013 1103   K 4.0 08/27/2013 1129   CL 104 08/27/2013 1129   CO2 22 09/25/2013 1103   CO2 27 08/27/2013 1129   BUN 14.8 09/25/2013 1103   BUN 10 08/27/2013 1129   CREATININE 0.7 09/25/2013 1103   CREATININE 0.69 08/27/2013 1129   CREATININE 0.9 08/02/2007 0033      Component Value Date/Time   CALCIUM 9.1 09/25/2013 1103   CALCIUM 8.9 08/27/2013 1129   ALKPHOS 79  09/25/2013 1103   ALKPHOS 93 08/27/2013 1129   AST 14 09/25/2013 1103   AST 19 08/27/2013 1129   ALT 11 09/25/2013 1103   ALT 14 08/27/2013 1129   BILITOT <0.20 09/25/2013 1103   BILITOT 0.4 08/27/2013 1129       RADIOGRAPHIC STUDIES: No results found.  ASSESSMENT: This is a very pleasant 79 years old white female with persistent iron deficiency anemia most likely secondary to nutritional deficiency. The patient was started recently on over the counter iron tablets and she has improvement in her anemia as well as iron parameters.  PLAN: I had a lengthy discussion with the patient and her husband today about her current status and treatment options. I ordered several studies today to evaluate her anemia including repeat CBC, comprehensive metabolic panel, iron study and  ferritin as well as serum erythropoietin, serum protein electrophoreses, serum folate and vitamin B 12. I recommended for the patient to continue on oral iron tablets and I changed her to prescription iron preparation in the form of Integra plus 1 capsule by mouth daily. I would see her back for followup visit in 6 weeks for reevaluation with repeat iron study and CBC. She has no further improvement in her condition, I would consider the patient for intravenous iron infusion with Feraheme. The patient was advised to call immediately if she has any concerning symptoms in the interval. The patient voices understanding of current disease status and treatment options and is in agreement with the current care plan.  All questions were answered. The patient knows to call the clinic with any problems, questions or concerns. We can certainly see the patient much sooner if necessary.  Thank you so much for allowing me to participate in the care of Stephanie Roach. I will continue to follow up the patient with you and assist in her care.  I spent 40 minutes counseling the patient face to face. The total time spent in the appointment was 60  minutes.  Disclaimer: This note was dictated with voice recognition software. Similar sounding words can inadvertently be transcribed and may not be corrected upon review.   Stephanie Roach K. 09/25/2013, 12:18 PM

## 2013-09-27 LAB — VITAMIN B12: Vitamin B-12: 335 pg/mL (ref 211–911)

## 2013-09-27 LAB — PROTEIN ELECTROPHORESIS, SERUM, WITH REFLEX
ALPHA-1-GLOBULIN: 7.8 % — AB (ref 2.9–4.9)
Albumin ELP: 54.9 % — ABNORMAL LOW (ref 55.8–66.1)
Alpha-2-Globulin: 10.8 % (ref 7.1–11.8)
Beta 2: 3.6 % (ref 3.2–6.5)
Beta Globulin: 8.2 % — ABNORMAL HIGH (ref 4.7–7.2)
Gamma Globulin: 14.7 % (ref 11.1–18.8)
TOTAL PROTEIN, SERUM ELECTROPHOR: 6.8 g/dL (ref 6.0–8.3)

## 2013-09-27 LAB — ERYTHROPOIETIN: Erythropoietin: 25.7 m[IU]/mL — ABNORMAL HIGH (ref 2.6–18.5)

## 2013-09-27 LAB — FOLATE: FOLATE: 17.4 ng/mL

## 2013-10-15 ENCOUNTER — Ambulatory Visit (INDEPENDENT_AMBULATORY_CARE_PROVIDER_SITE_OTHER): Payer: Medicare Other | Admitting: Physician Assistant

## 2013-10-15 ENCOUNTER — Encounter: Payer: Self-pay | Admitting: Physician Assistant

## 2013-10-15 VITALS — BP 120/80 | HR 70 | Ht 65.0 in | Wt 141.0 lb

## 2013-10-15 DIAGNOSIS — R1013 Epigastric pain: Secondary | ICD-10-CM

## 2013-10-15 DIAGNOSIS — G8929 Other chronic pain: Secondary | ICD-10-CM

## 2013-10-15 DIAGNOSIS — Z800 Family history of malignant neoplasm of digestive organs: Secondary | ICD-10-CM

## 2013-10-15 DIAGNOSIS — D509 Iron deficiency anemia, unspecified: Secondary | ICD-10-CM

## 2013-10-15 DIAGNOSIS — Z8719 Personal history of other diseases of the digestive system: Secondary | ICD-10-CM

## 2013-10-15 MED ORDER — OMEPRAZOLE 40 MG PO CPDR: 40.0000 mg | DELAYED_RELEASE_CAPSULE | Freq: Every day | ORAL | Status: DC

## 2013-10-15 MED ORDER — MOVIPREP 100 G PO SOLR: 1.0000 | ORAL | Status: DC

## 2013-10-15 NOTE — Progress Notes (Signed)
Subjective:    Patient ID: Stephanie Roach, female    DOB: 1935/05/12, 78 y.o.   MRN: [id number]  HPI  Stephanie Roach Is a pleasant 78 year old female known to Dr. Delfin Edis from previous procedures. She had endoscopy done in 2012 showed a 2 cm hiatal hernia. Small bowel biopsies done which were negative with no evidence for atrophy. Colonoscopy last done in October of 2010 was a normal exam. Patient does have prior history of tubular adenomatous polyps in 2002. She is referred today by her primary care provider and had also recently been seen by Dr. Earlie Server for hematology for iron deficiency anemia. He feels that her iron deficiency is likely nutritional and she is now on Integra plus1 daily over the past couple of weeks. Serum protein electrophoresis was done and was nonspecific last hemoglobin was 10.1 hematocrit of 34.1 MCV of 74 ferritin of 22 serum iron of 178 TIBC 418 an iron saturation of 42. Patient is very strong family history of colon cancer her father is deceased from colon cancer she has a brother who has history of colon cancer and 2 aunts on paternal side of the family with history as of colon cancer as well. Patient says she has been having some intermittent burning in her epigastrium and says sometimes she feels better with some food on her stomach. No nausea or vomiting appetite has been fine weight has been stable she has not noted any melena or hematochezia. She does have some reflux her symptoms on a fairly regular basis no dysphagia. She had previously been on omeprazole but says she hasn't been taking any of that in months. Also has prior history of H. pylori which had been treated. She not on any regular aspirin or NSAIDs.     Review of Systems  Constitutional: Negative.   HENT: Negative.   Eyes: Negative.   Respiratory: Negative.   Cardiovascular: Negative.   Gastrointestinal: Positive for abdominal pain.  Endocrine: Negative.   Genitourinary: Negative.   Musculoskeletal:  Negative.   Skin: Negative.   Allergic/Immunologic: Negative.   Neurological: Negative.   Hematological: Negative.   Psychiatric/Behavioral: Negative.    Outpatient Prescriptions Prior to Visit  Medication Sig Dispense Refill  . calcium carbonate 200 MG capsule Take 250 mg by mouth daily.        . cholecalciferol (VITAMIN D) 1000 UNITS tablet Take 5,000 Units by mouth daily.      . Fe Fum-FePoly-FA-Vit C-Vit B3 (INTEGRA F) 125-1 MG CAPS Take 1 capsule by mouth daily.  30 capsule  1  . fish oil-omega-3 fatty acids 1000 MG capsule Take 2 g by mouth daily.      Marland Kitchen LORazepam (ATIVAN) 1 MG tablet Take 1 mg by mouth every 8 (eight) hours.        Marland Kitchen omeprazole (PRILOSEC) 20 MG capsule Take 20 mg by mouth daily.      . pravastatin (PRAVACHOL) 40 MG tablet Take 1 tablet (40 mg total) by mouth every evening.  30 tablet  3  . ranitidine (ZANTAC) 300 MG tablet Take 1 tablet (300 mg total) by mouth at bedtime. To prevent Heart Burn  90 tablet  99  . traZODone (DESYREL) 50 MG tablet       . venlafaxine (EFFEXOR-XR) 75 MG 24 hr capsule Take 75 mg by mouth daily.         Facility-Administered Medications Prior to Visit  Medication Dose Route Frequency Provider Last Rate Last Dose  . 0.9 %  sodium  chloride infusion  500 mL Intravenous Continuous Lafayette Dragon, MD       Allergies  Allergen Reactions  . Morphine And Related    Patient Active Problem List   Diagnosis Date Noted  . Encounter for long-term (current) use of other medications 05/24/2013  . Prediabetes   . Hyperlipidemia   . Hypertension   . Vitamin D deficiency   . Family hx of colon cancer 08/11/2010  . Hx of adenomatous colonic polyps 08/11/2010  . Iron deficiency anemia 08/11/2010  . Chronic anxiety 08/11/2010   History  Substance Use Topics  . Smoking status: Never Smoker   . Smokeless tobacco: Never Used  . Alcohol Use: No   family history includes Breast cancer in her cousin; Colon cancer in her father; Colon polyps in her  maternal aunt; Pancreatic cancer in her paternal aunt; Stroke in her mother; Ulcerative colitis in her brother.     Objective:   Physical Exam well-developed older white female in no acute distress, pleasant blood pressure 120/80 pulse 70 height 5 foot 5 weight 141. HEENT ;nontraumatic normocephalic EOMI PERRLA sclera anicteric, Supple; no JVD, Cardiovascular; regular rate and rhythm with S1-S2 no murmur or gallop, Pulmonary ;clear bilaterally, Abdomen; soft minimally tender in the epigastrium there is no guarding or rebound no palpable mass or hepatosplenomegaly bowel sounds are present, Rectal; exam not done, Extremities ;no clubbing cyanosis or edema skin warm and dry, Psych; mood and affect normal and appropriate        Assessment & Plan:   #20  78 year old female with an iron deficiency anemia-recently started on oral iron supplementation No overt evidence for chronic GI blood loss Previous small bowel biopsies negative for celiac disease #2 positive family history of colon cancer in multiple relatives-patient is due for followup colonoscopy #3 personal history of tubular adenomatous polyp 2002 last exam 2010 normal #4 dyspepsia-rule out gastritis, recurrent or refractory H. Pyloric #4 GERD  Plan; restart omeprazole at 40 mg by mouth every morning H. pylori stool antigen and treat if positive Schedule for EGD and colonoscopy with Dr. Olevia Perches. Procedures discussed in detail with the patient and she is agreeable to proceed

## 2013-10-15 NOTE — Progress Notes (Signed)
Reviewed and agree with EGD/colon

## 2013-10-15 NOTE — Patient Instructions (Addendum)
Please go to the basement level to our lab for a stool test for H Pylori. You have been scheduled for an endoscopy and colonoscopy. Please follow the written instructions given to you at your visit today. Please pick up your prep at the pharmacy within the next 1-3 days. [facility] [address] [place], [place]. We also sent a prescription for Omeprazole ( Prilosec) 40 mg. Take 1 daily in the morning before breakfast. If you use inhalers (even only as needed), please bring them with you on the day of your procedure. Your physician has requested that you go to www.startemmi.com and enter the access code given to you at your visit today. This web site gives a general overview about your procedure. However, you should still follow specific instructions given to you by our office regarding your preparation for the procedure.

## 2013-10-16 ENCOUNTER — Ambulatory Visit
Admission: RE | Admit: 2013-10-16 | Discharge: 2013-10-16 | Disposition: A | Discharge status: Home / Self Care | Payer: Medicare Other | Source: Ambulatory Visit

## 2013-10-16 ENCOUNTER — Ambulatory Visit
Admission: RE | Admit: 2013-10-16 | Discharge: 2013-10-16 | Disposition: A | Discharge status: Home / Self Care | Payer: Medicare Other | Source: Ambulatory Visit | Attending: Physician Assistant | Admitting: Physician Assistant

## 2013-10-16 DIAGNOSIS — Z1231 Encounter for screening mammogram for malignant neoplasm of breast: Secondary | ICD-10-CM

## 2013-10-16 DIAGNOSIS — E2839 Other primary ovarian failure: Secondary | ICD-10-CM

## 2013-10-18 ENCOUNTER — Telehealth: Payer: Self-pay | Admitting: Internal Medicine

## 2013-10-18 NOTE — Telephone Encounter (Signed)
Patient may call if she wishes for different prep. Only other option is to reinstruct using miralax prep.

## 2013-10-23 ENCOUNTER — Encounter: Payer: Self-pay | Admitting: Internal Medicine

## 2013-10-30 ENCOUNTER — Other Ambulatory Visit (HOSPITAL_BASED_OUTPATIENT_CLINIC_OR_DEPARTMENT_OTHER): Payer: Medicare Other

## 2013-10-30 DIAGNOSIS — D509 Iron deficiency anemia, unspecified: Secondary | ICD-10-CM

## 2013-10-30 LAB — CBC WITH DIFFERENTIAL/PLATELET
BASO%: 0.4 % (ref 0.0–2.0)
Basophils Absolute: 0 10*3/uL (ref 0.0–0.1)
EOS%: 0 % (ref 0.0–7.0)
Eosinophils Absolute: 0 10*3/uL (ref 0.0–0.5)
HCT: 38.8 % (ref 34.8–46.6)
HGB: 12 g/dL (ref 11.6–15.9)
LYMPH%: 26.7 % (ref 14.0–49.7)
MCH: 23.7 pg — AB (ref 25.1–34.0)
MCHC: 30.9 g/dL — ABNORMAL LOW (ref 31.5–36.0)
MCV: 76.5 fL — AB (ref 79.5–101.0)
MONO#: 0.4 10*3/uL (ref 0.1–0.9)
MONO%: 7.8 % (ref 0.0–14.0)
NEUT#: 3.5 10*3/uL (ref 1.5–6.5)
NEUT%: 65.1 % (ref 38.4–76.8)
Platelets: 342 10*3/uL (ref 145–400)
RBC: 5.07 10*6/uL (ref 3.70–5.45)
RDW: 22.4 % — ABNORMAL HIGH (ref 11.2–14.5)
WBC: 5.4 10*3/uL (ref 3.9–10.3)
lymph#: 1.4 10*3/uL (ref 0.9–3.3)

## 2013-10-30 LAB — IRON AND TIBC CHCC
%SAT: 15 % — ABNORMAL LOW (ref 21–57)
Iron: 54 ug/dL (ref 41–142)
TIBC: 351 ug/dL (ref 236–444)
UIBC: 298 ug/dL (ref 120–384)

## 2013-10-30 LAB — FERRITIN CHCC: FERRITIN: 22 ng/mL (ref 9–269)

## 2013-11-01 ENCOUNTER — Other Ambulatory Visit: Payer: Medicare Other

## 2013-11-01 DIAGNOSIS — R1013 Epigastric pain: Secondary | ICD-10-CM

## 2013-11-01 DIAGNOSIS — Z8719 Personal history of other diseases of the digestive system: Secondary | ICD-10-CM

## 2013-11-01 DIAGNOSIS — G8929 Other chronic pain: Secondary | ICD-10-CM

## 2013-11-01 DIAGNOSIS — D509 Iron deficiency anemia, unspecified: Secondary | ICD-10-CM

## 2013-11-01 DIAGNOSIS — Z800 Family history of malignant neoplasm of digestive organs: Secondary | ICD-10-CM

## 2013-11-06 ENCOUNTER — Telehealth: Payer: Self-pay | Admitting: Internal Medicine

## 2013-11-06 ENCOUNTER — Encounter: Payer: Self-pay | Admitting: Internal Medicine

## 2013-11-06 ENCOUNTER — Ambulatory Visit (HOSPITAL_BASED_OUTPATIENT_CLINIC_OR_DEPARTMENT_OTHER): Payer: Medicare Other | Admitting: Internal Medicine

## 2013-11-06 VITALS — BP 146/78 | HR 85 | Temp 98.4°F | Resp 18 | Ht 65.0 in | Wt 146.2 lb

## 2013-11-06 DIAGNOSIS — D509 Iron deficiency anemia, unspecified: Secondary | ICD-10-CM

## 2013-11-06 MED ORDER — INTEGRA F 125-1 MG PO CAPS: 1.0000 | ORAL_CAPSULE | Freq: Every day | ORAL | Status: DC

## 2013-11-06 NOTE — Telephone Encounter (Signed)
Pt confirmed labs/ov per 10/13 POF, gave pt AVS.... KJ °

## 2013-11-06 NOTE — Progress Notes (Signed)
    [facility] [phone]   [phone]  OFFICE PROGRESS NOTE  MCKEOWN,WILLIAM DAVID, MD [address] [place] [address]  DIAGNOSIS: Iron deficiency anemia  PRIOR THERAPY: None  CURRENT THERAPY: Integra +1 capsule by mouth daily.  INTERVAL HISTORY: Stephanie Roach 77 y.o. female returns to the clinic today for followup visit accompanied by her husband. The patient is feeling much better with less fatigue after starting treatment with Integra plus. She denied having any significant dizzy spells. She has no chest pain, shortness of breath, cough or hemoptysis. She has no nausea or vomiting. She denied having any significant weight loss or night sweats. She is tolerating her treatment with Integra plus fairly well with no significant adverse effects. She is scheduled to have a colonoscopy by Dr. Brodie later this month. She had repeat CBC and iron study performed recently and she is here for evaluation and discussion of her lab results.  MEDICAL HISTORY: Past Medical History  Diagnosis Date  . Anemia   . Hyperlipidemia   . Arthritis   . Anxiety   . Depression   . Allergic rhinitis   . Sleep apnea   . Hypertension   . Prediabetes   . Migraine   . GERD (gastroesophageal reflux disease)   . Vitamin D deficiency     ALLERGIES:  is allergic to morphine and related.  MEDICATIONS:  Current Outpatient Prescriptions  Medication Sig Dispense Refill  . calcium carbonate 200 MG capsule Take 250 mg by mouth daily.        . cholecalciferol (VITAMIN D) 1000 UNITS tablet Take 5,000 Units by mouth daily.      . Fe Fum-FePoly-FA-Vit C-Vit B3 (INTEGRA F) 125-1 MG CAPS Take 1 capsule by mouth daily.  30 capsule  1  . fish oil-omega-3 fatty acids 1000 MG capsule Take 2 g by mouth daily.      . LORazepam (ATIVAN) 1 MG tablet Take 1 mg by mouth every 8 (eight) hours.        . MOVIPREP 100 G SOLR Take 1 kit (200 g total) by mouth as  directed.  1 kit  0  . omeprazole (PRILOSEC) 20 MG capsule Take 20 mg by mouth daily.      . omeprazole (PRILOSEC) 40 MG capsule Take 1 capsule (40 mg total) by mouth daily.  90 capsule  3  . pravastatin (PRAVACHOL) 40 MG tablet Take 1 tablet (40 mg total) by mouth every evening.  30 tablet  3  . ranitidine (ZANTAC) 300 MG tablet Take 1 tablet (300 mg total) by mouth at bedtime. To prevent Heart Burn  90 tablet  99  . traZODone (DESYREL) 50 MG tablet       . venlafaxine (EFFEXOR-XR) 75 MG 24 hr capsule Take 75 mg by mouth daily.         Current Facility-Administered Medications  Medication Dose Route Frequency Provider Last Rate Last Dose  . 0.9 %  sodium chloride infusion  500 mL Intravenous Continuous Dora M Brodie, MD        SURGICAL HISTORY:  Past Surgical History  Procedure Laterality Date  . Cholecystectomy  2003  . Knee arthroscopy    . Bunionectomy    . Wrist fracture surgery    . Cataract extraction Bilateral     REVIEW OF SYSTEMS:  A comprehensive review of systems was negative.   PHYSICAL EXAMINATION: General appearance: alert, cooperative and no distress Head: Normocephalic, without   obvious abnormality, atraumatic Neck: no adenopathy, no JVD, supple, symmetrical, trachea midline and thyroid not enlarged, symmetric, no tenderness/mass/nodules Lymph nodes: Cervical, supraclavicular, and axillary nodes normal. Resp: clear to auscultation bilaterally Back: symmetric, no curvature. ROM normal. No CVA tenderness. Cardio: regular rate and rhythm, S1, S2 normal, no murmur, click, rub or gallop GI: soft, non-tender; bowel sounds normal; no masses,  no organomegaly Extremities: extremities normal, atraumatic, no cyanosis or edema  ECOG PERFORMANCE STATUS: 1 - Symptomatic but completely ambulatory  Blood pressure 146/78, pulse 85, temperature 98.4 F (36.9 C), temperature source Oral, resp. rate 18, height 5' 5" (1.651 m), weight 146 lb 3.2 oz (66.316 kg).  LABORATORY  DATA: Lab Results  Component Value Date   WBC 5.4 10/30/2013   HGB 12.0 10/30/2013   HCT 38.8 10/30/2013   MCV 76.5* 10/30/2013   PLT 342 10/30/2013      Chemistry      Component Value Date/Time   NA 141 09/25/2013 1103   NA 138 08/27/2013 1129   K 3.7 09/25/2013 1103   K 4.0 08/27/2013 1129   CL 104 08/27/2013 1129   CO2 22 09/25/2013 1103   CO2 27 08/27/2013 1129   BUN 14.8 09/25/2013 1103   BUN 10 08/27/2013 1129   CREATININE 0.7 09/25/2013 1103   CREATININE 0.69 08/27/2013 1129   CREATININE 0.9 08/02/2007 0033      Component Value Date/Time   CALCIUM 9.1 09/25/2013 1103   CALCIUM 8.9 08/27/2013 1129   ALKPHOS 79 09/25/2013 1103   ALKPHOS 93 08/27/2013 1129   AST 14 09/25/2013 1103   AST 19 08/27/2013 1129   ALT 11 09/25/2013 1103   ALT 14 08/27/2013 1129   BILITOT <0.20 09/25/2013 1103   BILITOT 0.4 08/27/2013 1129       RADIOGRAPHIC STUDIES:  ASSESSMENT AND PLAN: This is a very pleasant 78 years old white female with iron deficiency anemia currently undergoing treatment with Integra plus 1 capsule by mouth daily and tolerating it fairly well. She has significant improvement in her anemia and iron study after starting the Integra plus. She is scheduled for colonoscopy by Dr. Olevia Perches later this month to rule out any gastrointestinal bleed. I recommended for the patient to continue her iron treatment as scheduled. She would come back for followup visit in 3 months with repeat CBC, iron study and ferritin. She was advised to call immediately if she has any concerning symptoms in the interval. The patient voices understanding of current disease status and treatment options and is in agreement with the current care plan.  All questions were answered. The patient knows to call the clinic with any problems, questions or concerns. We can certainly see the patient much sooner if necessary.  Disclaimer: This note was dictated with voice recognition software. Similar sounding words can inadvertently be transcribed and  may not be corrected upon review.

## 2013-11-21 ENCOUNTER — Encounter: Payer: Self-pay | Admitting: Internal Medicine

## 2013-11-21 ENCOUNTER — Ambulatory Visit (AMBULATORY_SURGERY_CENTER): Payer: Medicare Other | Admitting: Internal Medicine

## 2013-11-21 VITALS — BP 127/85 | HR 72 | Temp 97.2°F | Resp 19 | Ht 65.0 in | Wt 141.0 lb

## 2013-11-21 DIAGNOSIS — K2990 Gastroduodenitis, unspecified, without bleeding: Secondary | ICD-10-CM

## 2013-11-21 DIAGNOSIS — D509 Iron deficiency anemia, unspecified: Secondary | ICD-10-CM

## 2013-11-21 DIAGNOSIS — K2950 Unspecified chronic gastritis without bleeding: Secondary | ICD-10-CM

## 2013-11-21 DIAGNOSIS — Z800 Family history of malignant neoplasm of digestive organs: Secondary | ICD-10-CM

## 2013-11-21 DIAGNOSIS — Z86010 Personal history of colonic polyps: Secondary | ICD-10-CM

## 2013-11-21 DIAGNOSIS — K2970 Gastritis, unspecified, without bleeding: Secondary | ICD-10-CM

## 2013-11-21 MED ORDER — SODIUM CHLORIDE 0.9 % IV SOLN: 500.0000 mL | INTRAVENOUS | Status: DC

## 2013-11-21 NOTE — Op Note (Signed)
[facility]  [address]. [place], [address]   COLONOSCOPY PROCEDURE REPORT  PATIENT: Stephanie Roach, Stephanie Roach  MR#: [id number] BIRTHDATE: 23-Nov-1935 , 89  yrs. old GENDER: female ENDOSCOPIST: Lafayette Dragon, MD REFERRED ZE:[id number] Melford Aase, M.D. PROCEDURE DATE:  11/21/2013 PROCEDURE:   Colonoscopy, screening First Screening Colonoscopy - Avg.  risk and is 50 yrs.  old or older - No.  Prior Negative Screening - Now for repeat screening. N/A  History of Adenoma - Now for follow-up colonoscopy & has been > or = to 3 yrs.  Yes hx of adenoma.  Has been 3 or more years since last colonoscopy.  Polyps Removed Today? No.  Polyps Removed Today? No.  Recommend repeat exam, <10 yrs? Polyps Removed Today? No.  Recommend repeat exam, <10 yrs? Yes.  Polyps Removed Today? No.  Recommend repeat exam, <10 yrs? Yes.  High risk (family or personal hx). ASA CLASS:   Class II INDICATIONS:iron deficiency anemia.  Tubular adenoma removed in 2002.  Last colonoscopy October 2010 no polyps.  Positive family history of colon cancer in multiple relatives including one brother and father and 2 aunts. MEDICATIONS: Monitored anesthesia care and Propofol 200 mg IV  DESCRIPTION OF PROCEDURE:   After the risks benefits and alternatives of the procedure were thoroughly explained, informed consent was obtained.  The digital rectal exam revealed no abnormalities of the rectum.   The LB [id number]  endoscope was introduced through the anus and advanced to the cecum, which was identified by both the appendix and ileocecal valve. No adverse events experienced.   The quality of the prep was Moviprep fair The instrument was then slowly withdrawn as the colon was fully examined.      COLON FINDINGS: There was mild diverticulosis noted in the sigmoid colon with associated muscular hypertrophy and tortuosity. Retroflexed views revealed no abnormalities. The time to cecum=8 minutes 40 seconds.   Withdrawal time=7 minutes 48 seconds.  The scope was withdrawn and the procedure completed. COMPLICATIONS: There were no immediate complications.  ENDOSCOPIC IMPRESSION: There was mild diverticulosis noted in the sigmoid colon  RECOMMENDATIONS: High fiber diet suspect chronic GI blood loss originating in upper GI tract from the stomach from [facility] erosions.  Patient to take long term proton pump inhibitors and  monitor H/H closely and supplement  oral iron orally  or intravenous iron as necessary  eSigned:  Lafayette Dragon, MD 11/21/2013 2:57 PM   cc:   PATIENT NAME:  Stephanie Roach, Stephanie Roach MR#: [id number]

## 2013-11-21 NOTE — Progress Notes (Signed)
Called to room to assist during endoscopic procedure.  Patient ID and intended procedure confirmed with present staff. Received instructions for my participation in the procedure from the performing physician.  

## 2013-11-21 NOTE — Patient Instructions (Addendum)
CALL DR. Nichola Sizer OFFICE FOR AN APPOINTMENT REGARDING YOUR DIARRHEA, IRRITABLE BOWEL PROBLEMS. [phone]   YOU HAD AN ENDOSCOPIC PROCEDURE TODAY AT [facility]: Refer to the procedure report that was given to you for any specific questions about what was found during the examination.  If the procedure report does not answer your questions, please call your gastroenterologist to clarify.  If you requested that your care partner not be given the details of your procedure findings, then the procedure report has been included in a sealed envelope for you to review at your convenience later.  YOU SHOULD EXPECT: Some feelings of bloating in the abdomen. Passage of more gas than usual.  Walking can help get rid of the air that was put into your GI tract during the procedure and reduce the bloating. If you had a lower endoscopy (such as a colonoscopy or flexible sigmoidoscopy) you may notice spotting of blood in your stool or on the toilet paper. If you underwent a bowel prep for your procedure, then you may not have a normal bowel movement for a few days.  DIET: Your first meal following the procedure should be a light meal and then it is ok to progress to your normal diet.  A half-sandwich or bowl of soup is an example of a good first meal.  Heavy or fried foods are harder to digest and may make you feel nauseous or bloated.  Likewise meals heavy in dairy and vegetables can cause extra gas to form and this can also increase the bloating.  Drink plenty of fluids but you should avoid alcoholic beverages for 24 hours.  ACTIVITY: Your care partner should take you home directly after the procedure.  You should plan to take it easy, moving slowly for the rest of the day.  You can resume normal activity the day after the procedure however you should NOT DRIVE or use heavy machinery for 24 hours (because of the sedation medicines used during the test).    SYMPTOMS TO REPORT IMMEDIATELY: A  gastroenterologist can be reached at any hour.  During normal business hours, 8:30 AM to 5:00 PM Monday through Friday, call [phone].  After hours and on weekends, please call the GI answering service at [phone] who will take a message and have the physician on call contact you.   Following lower endoscopy (colonoscopy or flexible sigmoidoscopy):  Excessive amounts of blood in the stool  Significant tenderness or worsening of abdominal pains  Swelling of the abdomen that is new, acute  Fever of 100F or higher  Following upper endoscopy (EGD)  Vomiting of blood or coffee ground material  New chest pain or pain under the shoulder blades  Painful or persistently difficult swallowing  New shortness of breath  Fever of 100F or higher  Black, tarry-looking stools  FOLLOW UP: If any biopsies were taken you will be contacted by phone or by letter within the next 1-3 weeks.  Call your gastroenterologist if you have not heard about the biopsies in 3 weeks.  Our staff will call the home number listed on your records the next business day following your procedure to check on you and address any questions or concerns that you may have at that time regarding the information given to you following your procedure. This is a courtesy call and so if there is no answer at the home number and we have not heard from you through the emergency physician on call, we will assume that  you have returned to your regular daily activities without incident.  SIGNATURES/CONFIDENTIALITY: You and/or your care partner have signed paperwork which will be entered into your electronic medical record.  These signatures attest to the fact that that the information above on your After Visit Summary has been reviewed and is understood.  Full responsibility of the confidentiality of this discharge information lies with you and/or your care-partner.

## 2013-11-21 NOTE — Op Note (Signed)
[facility]  [address]. [place], [address]   ENDOSCOPY PROCEDURE REPORT  PATIENT: Stephanie, Suthers Roach  MR#: [id number] BIRTHDATE: 09/22/1935 , 43  yrs. old GENDER: female ENDOSCOPIST: Lafayette Dragon, MD REFERRED BY:  Unk Pinto, M.D. PROCEDURE DATE:  11/21/2013 PROCEDURE:  EGD w/ biopsy ASA CLASS:     Class II INDICATIONS:  iron deficiency anemia at last endoscopy in 2012 showed 2 cm hiatal hernia.  Small bowel biopsies were negative. MEDICATIONS: Monitored anesthesia care and Propofol 200 mg IV TOPICAL ANESTHETIC: none  DESCRIPTION OF PROCEDURE: After the risks benefits and alternatives of the procedure were thoroughly explained, informed consent was obtained.  The LB [id number] endoscope was introduced through the mouth and advanced to the second portion of the duodenum , Without limitations.  The instrument was slowly withdrawn as the mucosa was fully examined.    Esophagus: esophageal lumen was tortuous. Mucosa appeared normal in proximal, mid and distal esophagus. There was no evidence of esophagitis. The distal esophageal lumen was angulated but there was no stricture and the scope traversed into a moderate-sized hiatal hernia which measured 4-5 cm extending from 33-38 cm from the incisors Stomach" there were multiple Cameron erosions in the gastric body and Deridder to prolapse of the hiatal hernia. One erosion had fresh blood on the top. There was no blood in the stomach. Gastric antrum was normal. Biopsies were obtained from [facility] erosions. Retroflexion of the endoscope confirmed presence of hiatal hernia. Duodenum: duodenal bulb and descending duodenum was normal[    biopsies were taken from descending duodenum     The scope was then withdrawn from the patient and the procedure completed.  COMPLICATIONS: There were no immediate complications.  ENDOSCOPIC IMPRESSION: 1.presbyesophagus 2. Moderate-sized hiatal hernia with Lysbeth Galas  erosions. Some of these erosions are fresh and indicate low-grade GI blood loss 3. Status post biopsies from small bowel and from gastric erosions  RECOMMENDATIONS: 1.  Await pathology results 2.  Anti-reflux regimen to be follow 3.  Continue PPI 4.  Pproceed with colonoscopy  REPEAT EXAM: for EGD pending biopsy results.  eSigned:  Lafayette Dragon, MD 11/21/2013 2:50 PM    CC:  PATIENT NAME:  Stephanie, Zahm Roach MR#: [id number]

## 2013-11-22 ENCOUNTER — Telehealth: Payer: Self-pay

## 2013-11-22 NOTE — Telephone Encounter (Signed)
  Follow up Call-  Call back number 11/21/2013  Post procedure Call Back phone  # [phone]  Permission to leave phone message Yes     Patient questions:  Do you have a fever, pain , or abdominal swelling? No. Pain Score  0 *  Have you tolerated food without any problems? Yes.    Have you been able to return to your normal activities? Yes.    Do you have any questions about your discharge instructions: Diet   No. Medications  No. Follow up visit  No.  Do you have questions or concerns about your Care? No.  Actions: * If pain score is 4 or above: No action needed, pain <4.

## 2013-11-26 ENCOUNTER — Encounter: Payer: Self-pay | Admitting: Internal Medicine

## 2013-11-28 ENCOUNTER — Encounter: Payer: Self-pay | Admitting: Internal Medicine

## 2013-11-30 ENCOUNTER — Telehealth: Payer: Self-pay | Admitting: Internal Medicine

## 2013-11-30 NOTE — Telephone Encounter (Signed)
Spoke with patient and reviewed results as per letter.

## 2013-12-05 ENCOUNTER — Encounter: Payer: Self-pay | Admitting: Physician Assistant

## 2013-12-05 ENCOUNTER — Ambulatory Visit (INDEPENDENT_AMBULATORY_CARE_PROVIDER_SITE_OTHER): Payer: Medicare Other | Admitting: Physician Assistant

## 2013-12-05 VITALS — BP 102/68 | HR 88 | Temp 98.6°F | Resp 16 | Ht 65.0 in | Wt 145.0 lb

## 2013-12-05 DIAGNOSIS — I10 Essential (primary) hypertension: Secondary | ICD-10-CM

## 2013-12-05 DIAGNOSIS — E559 Vitamin D deficiency, unspecified: Secondary | ICD-10-CM

## 2013-12-05 DIAGNOSIS — D649 Anemia, unspecified: Secondary | ICD-10-CM

## 2013-12-05 DIAGNOSIS — R7303 Prediabetes: Secondary | ICD-10-CM

## 2013-12-05 DIAGNOSIS — Z79899 Other long term (current) drug therapy: Secondary | ICD-10-CM

## 2013-12-05 DIAGNOSIS — R7309 Other abnormal glucose: Secondary | ICD-10-CM

## 2013-12-05 DIAGNOSIS — E785 Hyperlipidemia, unspecified: Secondary | ICD-10-CM

## 2013-12-05 LAB — CBC WITH DIFFERENTIAL/PLATELET
Basophils Absolute: 0.1 10*3/uL (ref 0.0–0.1)
Basophils Relative: 1 % (ref 0–1)
EOS ABS: 0 10*3/uL (ref 0.0–0.7)
Eosinophils Relative: 0 % (ref 0–5)
HCT: 39.3 % (ref 36.0–46.0)
HEMOGLOBIN: 13.3 g/dL (ref 12.0–15.0)
LYMPHS ABS: 1.5 10*3/uL (ref 0.7–4.0)
Lymphocytes Relative: 25 % (ref 12–46)
MCH: 25.3 pg — AB (ref 26.0–34.0)
MCHC: 33.8 g/dL (ref 30.0–36.0)
MCV: 74.7 fL — ABNORMAL LOW (ref 78.0–100.0)
MONOS PCT: 8 % (ref 3–12)
Monocytes Absolute: 0.5 10*3/uL (ref 0.1–1.0)
NEUTROS ABS: 3.8 10*3/uL (ref 1.7–7.7)
Neutrophils Relative %: 66 % (ref 43–77)
Platelets: 383 10*3/uL (ref 150–400)
RBC: 5.26 MIL/uL — ABNORMAL HIGH (ref 3.87–5.11)
RDW: 18.3 % — ABNORMAL HIGH (ref 11.5–15.5)
WBC: 5.8 10*3/uL (ref 4.0–10.5)

## 2013-12-05 LAB — HEPATIC FUNCTION PANEL
ALT: 11 U/L (ref 0–35)
AST: 15 U/L (ref 0–37)
Albumin: 4.3 g/dL (ref 3.5–5.2)
Alkaline Phosphatase: 85 U/L (ref 39–117)
BILIRUBIN DIRECT: 0.1 mg/dL (ref 0.0–0.3)
BILIRUBIN INDIRECT: 0.2 mg/dL (ref 0.2–1.2)
Total Bilirubin: 0.3 mg/dL (ref 0.2–1.2)
Total Protein: 7 g/dL (ref 6.0–8.3)

## 2013-12-05 LAB — BASIC METABOLIC PANEL WITH GFR
BUN: 10 mg/dL (ref 6–23)
CO2: 25 meq/L (ref 19–32)
Calcium: 9.5 mg/dL (ref 8.4–10.5)
Chloride: 103 mEq/L (ref 96–112)
Creat: 0.66 mg/dL (ref 0.50–1.10)
GFR, Est African American: >89 mL/min
GFR, Est Non African American: 87 mL/min
GLUCOSE: 87 mg/dL (ref 70–99)
POTASSIUM: 3.6 meq/L (ref 3.5–5.3)
SODIUM: 140 meq/L (ref 135–145)

## 2013-12-05 LAB — LIPID PANEL
Cholesterol: 260 mg/dL — ABNORMAL HIGH (ref 0–200)
HDL: 65 mg/dL (ref >39)
LDL CALC: 142 mg/dL — AB (ref 0–99)
TRIGLYCERIDES: 264 mg/dL — AB (ref <150)
Total CHOL/HDL Ratio: 4 Ratio
VLDL: 53 mg/dL — AB (ref 0–40)

## 2013-12-05 LAB — TSH: TSH: 1.642 u[IU]/mL (ref 0.350–4.500)

## 2013-12-05 LAB — MAGNESIUM: Magnesium: 2 mg/dL (ref 1.5–2.5)

## 2013-12-05 NOTE — Progress Notes (Signed)
Assessment and Plan:  Hypertension: Continue medication, monitor blood pressure at home. Continue DASH diet.  Reminder to go to the ER if any CP, SOB, nausea, dizziness, severe HA, changes vision/speech, left arm numbness and tingling, and jaw pain. Cholesterol: Continue diet and exercise. Check cholesterol.  Pre-diabetes-Continue diet and exercise. Check A1C Vitamin D Def- check level and continue medications.  GERD- will give her dexilant samples for the time being, stop the prilosec and follow up with Dr. Olevia Perches.  IBS- add benefiber Iron def anemia- ? GI source, will follow up with GI.   Continue diet and meds as discussed. Further disposition pending results of labs.  HPI 78 y.o. female  presents for 3 month follow up with hypertension, hyperlipidemia, prediabetes and vitamin D. Her blood pressure has been controlled at home, today their BP is BP: 102/68 mmHg She does not workout. She denies chest pain, shortness of breath, dizziness.  She is on cholesterol medication and denies myalgias. Her cholesterol is not at goal. The cholesterol last visit was:   Lab Results  Component Value Date   CHOL 245* 08/27/2013   HDL 75 08/27/2013   LDLCALC 140* 08/27/2013   TRIG 148 08/27/2013   CHOLHDL 3.3 08/27/2013   She has been working on diet and exercise for prediabetes, and denies paresthesia of the feet, polydipsia and polyuria. Last A1C in the office was:  Lab Results  Component Value Date   HGBA1C 6.2* 08/27/2013   Patient is on Vitamin D supplement.   Lab Results  Component Value Date   VD25OH 45 08/27/2013     She has had chronic iron def anemia and following with Dr. Julien Nordmann and is on integra to increase her iron. She had recent visit with GI to evaluate GI blood loss, colonoscopy was normal other than diverticulosis but her EGD showed HH with erosions, neg for Hpylori. The erosions may explain the anemia, she is on prilosec 40mg  daily. She does complains of some dysphagia, fatigue.  She will follow up with Dr. Olevia Perches. She also complains of intermittent diarrhea worse with stress.  Lab Results  Component Value Date   WBC 5.4 10/30/2013   HGB 12.0 10/30/2013   HCT 38.8 10/30/2013   MCV 76.5* 10/30/2013   PLT 342 10/30/2013   Lab Results  Component Value Date   IRON 54 10/30/2013   TIBC 351 10/30/2013   FERRITIN 22 10/30/2013   Current Medications:  Current Outpatient Prescriptions on File Prior to Visit  Medication Sig Dispense Refill  . calcium carbonate 200 MG capsule Take 250 mg by mouth daily.      . cholecalciferol (VITAMIN D) 1000 UNITS tablet Take 5,000 Units by mouth daily.    . Fe Fum-FePoly-FA-Vit C-Vit B3 (INTEGRA F) 125-1 MG CAPS Take 1 capsule by mouth daily. 30 capsule 3  . fish oil-omega-3 fatty acids 1000 MG capsule Take 2 g by mouth daily.    Marland Kitchen LORazepam (ATIVAN) 1 MG tablet Take 1 mg by mouth every 8 (eight) hours.      Marland Kitchen omeprazole (PRILOSEC) 40 MG capsule Take 1 capsule (40 mg total) by mouth daily. 90 capsule 3  . pravastatin (PRAVACHOL) 40 MG tablet Take 1 tablet (40 mg total) by mouth every evening. 30 tablet 3  . ranitidine (ZANTAC) 300 MG tablet Take 1 tablet (300 mg total) by mouth at bedtime. To prevent Heart Burn 90 tablet 99  . traZODone (DESYREL) 50 MG tablet     . venlafaxine (EFFEXOR-XR) 75 MG 24  hr capsule Take 75 mg by mouth daily.       Current Facility-Administered Medications on File Prior to Visit  Medication Dose Route Frequency Provider Last Rate Last Dose  . 0.9 %  sodium chloride infusion  500 mL Intravenous Continuous Lafayette Dragon, MD       Medical History:  Past Medical History  Diagnosis Date  . Anemia   . Hyperlipidemia   . Arthritis   . Anxiety   . Depression   . Allergic rhinitis   . Sleep apnea   . Hypertension   . Prediabetes   . Migraine   . GERD (gastroesophageal reflux disease)   . Vitamin D deficiency    Allergies:  Allergies  Allergen Reactions  . Morphine And Related      Review of  Systems: [X]  = complains of  [ ]  = denies  General: Fatigue [ ]  Fever [ ]  Chills [ ]  Weakness [ ]   Insomnia [ ]  Eyes: Redness [ ]  Blurred vision [ ]  Diplopia [ ]   ENT: Congestion [ ]  Sinus Pain [ ]  Post Nasal Drip [ ]  Sore Throat [ ]  Earache [ ]   Cardiac: Chest pain/pressure [ ]  SOB [ ]  Orthopnea [ ]   Palpitations [ ]   Paroxysmal nocturnal dyspnea[ ]  Claudication [ ]  Edema [ ]   Pulmonary: Cough [ ]  Wheezing[ ]   SOB [ ]   Snoring [ ]   GI: Nausea [ ]  Vomiting[ ]  Dysphagia[ ]  Heartburn[ ]  Abdominal pain [ ]  Constipation [ ] ; Diarrhea [ ] ; BRBPR [ ]  Melena[ ]  GU: Hematuria[ ]  Dysuria [ ]  Nocturia[ ]  Urgency [ ]   Hesitancy [ ]  Discharge [ ]  Neuro: Headaches[ ]  Vertigo[ ]  Paresthesias[ ]  Spasm [ ]  Speech changes [ ]  Incoordination [ ]   Ortho: Arthritis [ ]  Joint pain [ ]  Muscle pain [ ]  Joint swelling [ ]  Back Pain [ ]  Skin:  Rash [ ]   Pruritis [ ]  Change in skin lesion [ ]   Psych: Depression[ ]  Anxiety[ ]  Confusion [ ]  Memory loss [ ]   Heme/Lypmh: Bleeding [ ]  Bruising [ ]  Enlarged lymph nodes [ ]   Endocrine: Visual blurring [ ]  Paresthesia [ ]  Polyuria [ ]  Polydypsea [ ]    Heat/cold intolerance [ ]  Hypoglycemia [ ]   Family history- Review and unchanged Social history- Review and unchanged Physical Exam: BP 102/68 mmHg  Pulse 88  Temp(Src) 98.6 F (37 C)  Resp 16  Ht 5\' 5"  (1.651 m)  Wt 145 lb (65.772 kg)  BMI 24.13 kg/m2 Wt Readings from Last 3 Encounters:  12/05/13 145 lb (65.772 kg)  11/21/13 141 lb (63.957 kg)  11/06/13 146 lb 3.2 oz (66.316 kg)   General Appearance: Well nourished, in no apparent distress. Eyes: PERRLA, EOMs, conjunctiva no swelling or erythema Sinuses: No Frontal/maxillary tenderness ENT/Mouth: Ext aud canals clear, TMs without erythema, bulging. No erythema, swelling, or exudate on post pharynx.  Tonsils not swollen or erythematous. Hearing normal. + TMJ tenderness Neck: Supple, thyroid normal.  Respiratory: Respiratory effort normal, BS equal bilaterally  without rales, rhonchi, wheezing or stridor.  Cardio: RRR with no MRGs. Brisk peripheral pulses without edema.  Abdomen: Soft, + BS.  Mild epigastric tenderness, no guarding, rebound, hernias, masses. Lymphatics: Non tender without lymphadenopathy.  Musculoskeletal: Full ROM, 5/5 strength, normal gait.  Skin: Warm, dry without rashes, lesions, ecchymosis.  Neuro: Cranial nerves intact. Normal muscle tone, no cerebellar symptoms. Sensation intact.  Psych: Awake and oriented X 3, normal affect, Insight and Judgment appropriate.  Vicie Mutters, PA-C 11:59 AM [facility]

## 2013-12-05 NOTE — Patient Instructions (Addendum)
Stop the prilosec for now and try the samples given, follow up with Dr. Garnette Czech is good for constipation/diarrhea/irritable bowel syndrome, it helps with weight loss and can help lower your bad cholesterol. Please do 1-2 TBSP in the morning in water, coffee, or tea. It can take up to a month before you can see a difference with your bowel movements. It is cheapest from [employer], [employer], [employer].      Bad carbs also include fruit juice, alcohol, and sweet tea. These are empty calories that do not signal to your brain that you are full.   Please remember the good carbs are still carbs which convert into sugar. So please measure them out no more than 1/2-1 cup of rice, oatmeal, pasta, and beans  Veggies are however free foods! Pile them on.   Not all fruit is created equal. Please see the list below, the fruit at the bottom is higher in sugars than the fruit at the top. Please avoid all dried fruits.   What is the TMJ? The temporomandibular (tem-PUH-ro-man-DIB-yoo-ler) joint, or the TMJ, connects the upper and lower jawbones. This joint allows the jaw to open wide and move back and forth when you chew, talk, or yawn.There are also several muscles that help this joint move. There can be muscle tightness and pain in the muscle that can cause several symptoms.  What causes TMJ pain? There are many causes of TMJ pain. Repeated chewing (for example, chewing gum) and clenching your teeth can cause pain in the joint. Some TMJ pain has no obvious cause. What can I do to ease the pain? There are many things you can do to help your pain get better. When you have pain:  Eat soft foods and stay away from chewy foods (for example, taffy) Try to use both sides of your mouth to chew Don't chew gum Don't open your mouth wide (for example, during yawning or singing) Don't bite your cheeks or fingernails Lower your amount of stress and worry Applying a warm, damp washcloth to the joint may  help. Over-the-counter pain medicines such as ibuprofen (one brand: Advil) or acetaminophen (one brand: Tylenol) might also help. Do not use these medicines if you are allergic to them or if your doctor told you not to use them. How can I stop the pain from coming back? When your pain is better, you can do these exercises to make your muscles stronger and to keep the pain from coming back:  Resisted mouth opening: Place your thumb or two fingers under your chin and open your mouth slowly, pushing up lightly on your chin with your thumb. Hold for three to six seconds. Close your mouth slowly. Resisted mouth closing: Place your thumbs under your chin and your two index fingers on the ridge between your mouth and the bottom of your chin. Push down lightly on your chin as you close your mouth. Tongue up: Slowly open and close your mouth while keeping the tongue touching the roof of the mouth. Side-to-side jaw movement: Place an object about one fourth of an inch thick (for example, two tongue depressors) between your front teeth. Slowly move your jaw from side to side. Increase the thickness of the object as the exercise becomes easier Forward jaw movement: Place an object about one fourth of an inch thick between your front teeth and move the bottom jaw forward so that the bottom teeth are in front of the top teeth. Increase the thickness of the object  as the exercise becomes easier. These exercises should not be painful. If it hurts to do these exercises, stop doing them and talk to your family doctor.

## 2013-12-06 LAB — HEMOGLOBIN A1C
HEMOGLOBIN A1C: 5.7 % — AB (ref <5.7)
Mean Plasma Glucose: 117 mg/dL — ABNORMAL HIGH (ref <117)

## 2013-12-06 LAB — VITAMIN D 25 HYDROXY (VIT D DEFICIENCY, FRACTURES): VIT D 25 HYDROXY: 44 ng/mL (ref 30–89)

## 2014-01-31 ENCOUNTER — Other Ambulatory Visit: Payer: Self-pay | Admitting: Dermatology

## 2014-01-31 DIAGNOSIS — L308 Other specified dermatitis: Secondary | ICD-10-CM | POA: Diagnosis not present

## 2014-01-31 DIAGNOSIS — L720 Epidermal cyst: Secondary | ICD-10-CM | POA: Diagnosis not present

## 2014-01-31 DIAGNOSIS — D1801 Hemangioma of skin and subcutaneous tissue: Secondary | ICD-10-CM | POA: Diagnosis not present

## 2014-01-31 DIAGNOSIS — D485 Neoplasm of uncertain behavior of skin: Secondary | ICD-10-CM | POA: Diagnosis not present

## 2014-02-04 ENCOUNTER — Other Ambulatory Visit (HOSPITAL_BASED_OUTPATIENT_CLINIC_OR_DEPARTMENT_OTHER): Payer: Medicare Other

## 2014-02-04 DIAGNOSIS — D509 Iron deficiency anemia, unspecified: Secondary | ICD-10-CM | POA: Diagnosis not present

## 2014-02-04 LAB — CBC WITH DIFFERENTIAL/PLATELET
BASO%: 0.2 % (ref 0.0–2.0)
Basophils Absolute: 0 10*3/uL (ref 0.0–0.1)
EOS ABS: 0 10*3/uL (ref 0.0–0.5)
EOS%: 0.1 % (ref 0.0–7.0)
HEMATOCRIT: 38.7 % (ref 34.8–46.6)
HGB: 12.3 g/dL (ref 11.6–15.9)
LYMPH%: 13.2 % — ABNORMAL LOW (ref 14.0–49.7)
MCH: 26.7 pg (ref 25.1–34.0)
MCHC: 31.8 g/dL (ref 31.5–36.0)
MCV: 83.9 fL (ref 79.5–101.0)
MONO#: 0.6 10*3/uL (ref 0.1–0.9)
MONO%: 6.4 % (ref 0.0–14.0)
NEUT#: 7 10*3/uL — ABNORMAL HIGH (ref 1.5–6.5)
NEUT%: 80.1 % — AB (ref 38.4–76.8)
Platelets: 288 10*3/uL (ref 145–400)
RBC: 4.61 10*6/uL (ref 3.70–5.45)
RDW: 14.4 % (ref 11.2–14.5)
WBC: 8.7 10*3/uL (ref 3.9–10.3)
lymph#: 1.2 10*3/uL (ref 0.9–3.3)

## 2014-02-04 LAB — IRON AND TIBC CHCC
%SAT: 14 % — ABNORMAL LOW (ref 21–57)
Iron: 50 ug/dL (ref 41–142)
TIBC: 361 ug/dL (ref 236–444)
UIBC: 311 ug/dL (ref 120–384)

## 2014-02-04 LAB — FERRITIN CHCC: FERRITIN: 50 ng/mL (ref 9–269)

## 2014-02-11 ENCOUNTER — Ambulatory Visit (HOSPITAL_BASED_OUTPATIENT_CLINIC_OR_DEPARTMENT_OTHER): Payer: Medicare Other | Admitting: Internal Medicine

## 2014-02-11 ENCOUNTER — Encounter: Payer: Self-pay | Admitting: Internal Medicine

## 2014-02-11 VITALS — BP 117/68 | HR 75 | Temp 97.7°F | Resp 18 | Ht 65.0 in | Wt 143.5 lb

## 2014-02-11 DIAGNOSIS — D509 Iron deficiency anemia, unspecified: Secondary | ICD-10-CM | POA: Diagnosis not present

## 2014-02-11 NOTE — Progress Notes (Signed)
[place] Telephone:([phone]   [phone]  OFFICE PROGRESS NOTE  Alesia Richards, MD [address] [place] [address]  DIAGNOSIS: Iron deficiency anemia  PRIOR THERAPY: None  CURRENT THERAPY: Integra +1 capsule by mouth daily.  INTERVAL HISTORY: Stephanie Roach 79 y.o. female returns to the clinic today for followup visit accompanied by her husband. The patient continues to feel better after starting treatment with Integra plus. She underwent upper endoscopy and colonoscopy under the care of Dr. Olevia Perches on 11/21/2013 and the upper endoscopy showed chronic GI blood loss origin aching in the upper GI tract from the stomach from [facility] erosions. There was also mild diverticulosis noted in the sigmoid colon. The patient was started on omeprazole by Dr. Olevia Perches. She denied having any significant dizzy spells. She has no chest pain, shortness of breath, cough or hemoptysis. She has no nausea or vomiting. She denied having any significant weight loss or night sweats. She is tolerating her treatment with Integra plus fairly well with no significant adverse effects. She had repeat CBC, iron study and ferritin performed recently and she is here for evaluation and discussion of her lab results.  MEDICAL HISTORY: Past Medical History  Diagnosis Date  . Anemia   . Hyperlipidemia   . Arthritis   . Anxiety   . Depression   . Allergic rhinitis   . Sleep apnea   . Hypertension   . Prediabetes   . Migraine   . GERD (gastroesophageal reflux disease)   . Vitamin D deficiency     ALLERGIES:  is allergic to morphine and related.  MEDICATIONS:  Current Outpatient Prescriptions  Medication Sig Dispense Refill  . calcium carbonate 200 MG capsule Take 250 mg by mouth daily.      . cholecalciferol (VITAMIN D) 1000 UNITS tablet Take 5,000 Units by mouth daily.    . Fe Fum-FePoly-FA-Vit C-Vit B3 (INTEGRA F) 125-1 MG CAPS Take 1 capsule by mouth  daily. 30 capsule 3  . fish oil-omega-3 fatty acids 1000 MG capsule Take 2 g by mouth daily.    Marland Kitchen LORazepam (ATIVAN) 1 MG tablet Take 1 mg by mouth every 8 (eight) hours.      Marland Kitchen omeprazole (PRILOSEC) 40 MG capsule Take 1 capsule (40 mg total) by mouth daily. 90 capsule 3  . pravastatin (PRAVACHOL) 40 MG tablet Take 1 tablet (40 mg total) by mouth every evening. 30 tablet 3  . ranitidine (ZANTAC) 300 MG tablet Take 1 tablet (300 mg total) by mouth at bedtime. To prevent Heart Burn 90 tablet 99  . traZODone (DESYREL) 50 MG tablet     . venlafaxine (EFFEXOR-XR) 75 MG 24 hr capsule Take 75 mg by mouth daily.       Current Facility-Administered Medications  Medication Dose Route Frequency Provider Last Rate Last Dose  . 0.9 %  sodium chloride infusion  500 mL Intravenous Continuous Lafayette Dragon, MD        SURGICAL HISTORY:  Past Surgical History  Procedure Laterality Date  . Cholecystectomy  2003  . Knee arthroscopy    . Bunionectomy    . Wrist fracture surgery    . Cataract extraction Bilateral     REVIEW OF SYSTEMS:  A comprehensive review of systems was negative.   PHYSICAL EXAMINATION: General appearance: alert, cooperative and no distress Head: Normocephalic, without obvious abnormality, atraumatic Neck: no adenopathy, no JVD, supple, symmetrical, trachea midline and thyroid not enlarged, symmetric, no  tenderness/mass/nodules Lymph nodes: Cervical, supraclavicular, and axillary nodes normal. Resp: clear to auscultation bilaterally Back: symmetric, no curvature. ROM normal. No CVA tenderness. Cardio: regular rate and rhythm, S1, S2 normal, no murmur, click, rub or gallop GI: soft, non-tender; bowel sounds normal; no masses,  no organomegaly Extremities: extremities normal, atraumatic, no cyanosis or edema  ECOG PERFORMANCE STATUS: 1 - Symptomatic but completely ambulatory  There were no vitals taken for this visit.  LABORATORY DATA: Lab Results  Component Value Date   WBC  8.7 02/04/2014   HGB 12.3 02/04/2014   HCT 38.7 02/04/2014   MCV 83.9 02/04/2014   PLT 288 02/04/2014      Chemistry      Component Value Date/Time   NA 140 12/05/2013 1221   NA 141 09/25/2013 1103   K 3.6 12/05/2013 1221   K 3.7 09/25/2013 1103   CL 103 12/05/2013 1221   CO2 25 12/05/2013 1221   CO2 22 09/25/2013 1103   BUN 10 12/05/2013 1221   BUN 14.8 09/25/2013 1103   CREATININE 0.66 12/05/2013 1221   CREATININE 0.7 09/25/2013 1103   CREATININE 0.9 08/02/2007 0033      Component Value Date/Time   CALCIUM 9.5 12/05/2013 1221   CALCIUM 9.1 09/25/2013 1103   ALKPHOS 85 12/05/2013 1221   ALKPHOS 79 09/25/2013 1103   AST 15 12/05/2013 1221   AST 14 09/25/2013 1103   ALT 11 12/05/2013 1221   ALT 11 09/25/2013 1103   BILITOT 0.3 12/05/2013 1221   BILITOT <0.20 09/25/2013 1103       RADIOGRAPHIC STUDIES:  ASSESSMENT AND PLAN: This is a very pleasant 79 years old white female with iron deficiency anemia currently undergoing treatment with Integra plus 1 capsule by mouth daily and tolerating it fairly well. She has significant improvement in her anemia and iron study after starting the Integra plus. Her GI bleed was found to be related to chronic blood loss from [place] erosions in the stomach.  Her CBC and iron studies today were fine. I recommended for her to continue on oral iron supplements at least 2-3 times a week. She will continue her routine follow-up visit by her primary care physician and I'll be happy to see her in the future on as-needed basis. She was advised to call immediately if she has any concerning symptoms in the interval. The patient voices understanding of current disease status and treatment options and is in agreement with the current care plan.  All questions were answered. The patient knows to call the clinic with any problems, questions or concerns. We can certainly see the patient much sooner if necessary.  Disclaimer: This note was dictated  with voice recognition software. Similar sounding words can inadvertently be transcribed and may not be corrected upon review.

## 2014-08-07 DIAGNOSIS — L820 Inflamed seborrheic keratosis: Secondary | ICD-10-CM | POA: Diagnosis not present

## 2014-08-07 DIAGNOSIS — L308 Other specified dermatitis: Secondary | ICD-10-CM | POA: Diagnosis not present

## 2014-08-07 DIAGNOSIS — D225 Melanocytic nevi of trunk: Secondary | ICD-10-CM | POA: Diagnosis not present

## 2014-08-07 DIAGNOSIS — L821 Other seborrheic keratosis: Secondary | ICD-10-CM | POA: Diagnosis not present

## 2014-08-07 DIAGNOSIS — D1801 Hemangioma of skin and subcutaneous tissue: Secondary | ICD-10-CM | POA: Diagnosis not present

## 2014-08-12 ENCOUNTER — Other Ambulatory Visit: Payer: Self-pay

## 2014-08-12 MED ORDER — AZITHROMYCIN 250 MG PO TABS: ORAL_TABLET | ORAL | Status: AC

## 2014-08-28 ENCOUNTER — Encounter: Payer: Self-pay | Admitting: Physician Assistant

## 2014-08-29 ENCOUNTER — Encounter: Payer: Self-pay | Admitting: Internal Medicine

## 2014-09-17 ENCOUNTER — Ambulatory Visit (INDEPENDENT_AMBULATORY_CARE_PROVIDER_SITE_OTHER): Payer: Medicare Other | Admitting: Physician Assistant

## 2014-09-17 ENCOUNTER — Encounter: Payer: Self-pay | Admitting: Physician Assistant

## 2014-09-17 VITALS — BP 108/64 | HR 100 | Temp 98.2°F | Resp 16 | Ht 65.0 in | Wt 147.0 lb

## 2014-09-17 DIAGNOSIS — R6889 Other general symptoms and signs: Secondary | ICD-10-CM | POA: Diagnosis not present

## 2014-09-17 DIAGNOSIS — Z0001 Encounter for general adult medical examination with abnormal findings: Secondary | ICD-10-CM | POA: Diagnosis not present

## 2014-09-17 DIAGNOSIS — Z789 Other specified health status: Secondary | ICD-10-CM | POA: Diagnosis not present

## 2014-09-17 DIAGNOSIS — E785 Hyperlipidemia, unspecified: Secondary | ICD-10-CM

## 2014-09-17 DIAGNOSIS — E559 Vitamin D deficiency, unspecified: Secondary | ICD-10-CM | POA: Diagnosis not present

## 2014-09-17 DIAGNOSIS — D509 Iron deficiency anemia, unspecified: Secondary | ICD-10-CM

## 2014-09-17 DIAGNOSIS — R7303 Prediabetes: Secondary | ICD-10-CM

## 2014-09-17 DIAGNOSIS — F419 Anxiety disorder, unspecified: Secondary | ICD-10-CM

## 2014-09-17 DIAGNOSIS — Z0000 Encounter for general adult medical examination without abnormal findings: Secondary | ICD-10-CM

## 2014-09-17 DIAGNOSIS — I10 Essential (primary) hypertension: Secondary | ICD-10-CM | POA: Diagnosis not present

## 2014-09-17 DIAGNOSIS — Z6824 Body mass index (BMI) 24.0-24.9, adult: Secondary | ICD-10-CM

## 2014-09-17 DIAGNOSIS — E876 Hypokalemia: Secondary | ICD-10-CM

## 2014-09-17 DIAGNOSIS — R7309 Other abnormal glucose: Secondary | ICD-10-CM | POA: Diagnosis not present

## 2014-09-17 DIAGNOSIS — Z86010 Personal history of colonic polyps: Secondary | ICD-10-CM

## 2014-09-17 DIAGNOSIS — M810 Age-related osteoporosis without current pathological fracture: Secondary | ICD-10-CM | POA: Insufficient documentation

## 2014-09-17 DIAGNOSIS — Z79899 Other long term (current) drug therapy: Secondary | ICD-10-CM

## 2014-09-17 DIAGNOSIS — F411 Generalized anxiety disorder: Secondary | ICD-10-CM

## 2014-09-17 LAB — CBC WITH DIFFERENTIAL/PLATELET
Basophils Absolute: 0.1 10*3/uL (ref 0.0–0.1)
Basophils Relative: 1 % (ref 0–1)
EOS PCT: 0 % (ref 0–5)
Eosinophils Absolute: 0 10*3/uL (ref 0.0–0.7)
HEMATOCRIT: 27.2 % — AB (ref 36.0–46.0)
HEMOGLOBIN: 8.1 g/dL — AB (ref 12.0–15.0)
Lymphocytes Relative: 26 % (ref 12–46)
Lymphs Abs: 1.6 10*3/uL (ref 0.7–4.0)
MCH: 20.4 pg — AB (ref 26.0–34.0)
MCHC: 29.8 g/dL — AB (ref 30.0–36.0)
MCV: 68.5 fL — ABNORMAL LOW (ref 78.0–100.0)
MONOS PCT: 11 % (ref 3–12)
MPV: 9.4 fL (ref 8.6–12.4)
Monocytes Absolute: 0.7 10*3/uL (ref 0.1–1.0)
NEUTROS ABS: 3.7 10*3/uL (ref 1.7–7.7)
Neutrophils Relative %: 62 % (ref 43–77)
Platelets: 401 10*3/uL — ABNORMAL HIGH (ref 150–400)
RBC: 3.97 MIL/uL (ref 3.87–5.11)
RDW: 15.9 % — AB (ref 11.5–15.5)
WBC: 6 10*3/uL (ref 4.0–10.5)

## 2014-09-17 LAB — HEMOGLOBIN A1C
Hgb A1c MFr Bld: 5.8 % — ABNORMAL HIGH (ref <5.7)
MEAN PLASMA GLUCOSE: 120 mg/dL — AB (ref <117)

## 2014-09-17 NOTE — Progress Notes (Addendum)
MEDICARE ANNUAL WELLNESS VISIT AND CPE  Assessment:   1. Essential hypertension - CBC with Differential - BASIC METABOLIC PANEL WITH GFR - Hepatic function panel - TSH - EKG 12-Lead - [place], RETROPERITNL ABD,  LTD  2. Prediabetes Discussed general issues about diabetes pathophysiology and management., Educational material distributed., Suggested low cholesterol diet., Encouraged aerobic exercise., Discussed foot care., Reminded to get yearly retinal exam. - Hemoglobin A1c - HM DIABETES FOOT EXAM  3. Depression/anxiety, + screening - continue medications, suggest getting counseling, names given, no suicidal/homicidal ideations.  4. Hyperlipidemia - Lipid panel - Urinalysis, Routine w reflex microscopic - Microalbumin / creatinine urine ratio  5. Vitamin D deficiency - Vit D  25 hydroxy (rtn osteoporosis monitoring)  6. Osteoporosis - DG Bone Density; Future- Willing to try foxamax once weekly, stop if any CP  7. GERD Continue PPI/H2 blocker, diet discussed  8. Anemia-  Check iron, CBC    Addendum: We will get you back on integra, follow up 2 weeks for kidney, CBC, iron, ferritin and B12. If you have any SOB, CP, dizziness, black stools go to ER.  We will monitor this closely.    Plan:   During the course of the visit the patient was educated and counseled about appropriate screening and preventive services including:    Pneumococcal vaccine   Influenza vaccine  Td vaccine  Screening electrocardiogram  Screening mammography  Bone densitometry screening  Colorectal cancer screening  Diabetes screening  Glaucoma screening  Nutrition counseling   Advanced directives: given information/requested  Conditions/risks identified: BMI: Discussed weight loss, diet, and increase physical activity.  Increase physical activity: AHA recommends 150 minutes of physical activity a week.  Medications reviewed DEXA- ordered Urinary Incontinence is not an issue:  discussed non pharmacology and pharmacology options.  Fall risk: low- discussed PT, home fall assessment, medications.   Subjective:   Stephanie Roach is a 79 y.o. female who presents for Medicare Annual Wellness Visit and complete physical.    Date of last medicare wellness visit was 08/2014.    Her blood pressure has been controlled at home, today their BP is BP: 108/64 mmHg She does not workout, she did some walking while in [place] recently visiting her daughter in [place]. She denies chest pain, shortness of breath, dizziness.  She is on cholesterol medication and denies myalgias. Her cholesterol is at goal. The cholesterol last visit was:   Lab Results  Component Value Date   CHOL 260* 12/05/2013   HDL 65 12/05/2013   LDLCALC 142* 12/05/2013   TRIG 264* 12/05/2013   CHOLHDL 4.0 12/05/2013    She has been working on diet and exercise for prediabetes, and denies polydipsia, polyuria and visual disturbances. Last A1C in the office was:  Lab Results  Component Value Date   HGBA1C 5.7* 12/05/2013   Patient is on Vitamin D supplement.   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 She was on integra and following with Dr. Julien Nordmann but she has been released from his practice.  Husband diagnosed with parkinson's and he is declining very quickly.   Names of Other Physician/Practitioners you currently use: 1. [facility]- here for primary care 2. Dr. Katy Fitch, eye doctor, last visit 3 months 3. Dr. Osa Craver, dentist, last visit 6 months Patient Care Team: Gwyndolyn Saxon  Melford Aase, MD as PCP - General (Internal Medicine) Lafayette Dragon, MD as Consulting Physician (Gastroenterology) Norma Fredrickson, MD as Consulting Physician (Psychiatry) Maia Breslow, MD as Consulting Physician (Orthopedic Surgery) Curt Bears, MD as Consulting Physician (Oncology)   Medication Review Current Outpatient Prescriptions on File Prior to Visit  Medication Sig Dispense Refill  . LORazepam (ATIVAN) 1 MG tablet Take 1 mg by mouth every 8 (eight) hours.      . traZODone (DESYREL) 50 MG tablet     . venlafaxine (EFFEXOR-XR) 75 MG 24 hr capsule Take 75 mg by mouth daily.      Marland Kitchen omeprazole (PRILOSEC) 40 MG capsule Take 1 capsule (40 mg total) by mouth daily. (Patient not taking: Reported on 09/17/2014) 90 capsule 3   Current Facility-Administered Medications on File Prior to Visit  Medication Dose Route Frequency Provider Last Rate Last Dose  . 0.9 %  sodium chloride infusion  500 mL Intravenous Continuous Lafayette Dragon, MD        Current Problems (verified) Patient Active Problem List   Diagnosis Date Noted  . Encounter for long-term (current) use of other medications 05/24/2013  . Prediabetes   . Hyperlipidemia   . Hypertension   . Vitamin D deficiency   . Family hx of colon cancer 08/11/2010  . Hx of adenomatous colonic polyps 08/11/2010  . Iron deficiency anemia 08/11/2010  . Chronic anxiety 08/11/2010    Screening Tests Health Maintenance  Topic Date Due  . INFLUENZA VACCINE  08/26/2014  . PNA vac Low Risk Adult (2 of 2 - PPSV23) 08/28/2014  . COLONOSCOPY  11/22/2018  . TETANUS/TDAP  08/06/2022  . DEXA SCAN  Completed  . ZOSTAVAX  Completed    Immunization History  Administered Date(s) Administered  . Pneumococcal Conjugate-13 08/27/2013  . Pneumococcal-Unspecified 12/06/2001  . Tdap 08/05/2012  . Zoster 02/05/2010    Preventative care: Last colonoscopy: 2015 EGD 2015 Last mammogram: 09/2013 Last pap smear/pelvic exam: 2012  DEXA: 09/2013 + osteoporosis  Prior vaccinations: TD or Tdap: 2014  Influenza: declines Prevnar 13: 2015 Pneumococcal: 2003 Shingles/Zostavax: 2012  Allergies Allergies  Allergen Reactions  . Morphine And Related    Surgical history Past Surgical  History  Procedure Laterality Date  . Cholecystectomy  2003  . Knee arthroscopy    . Bunionectomy    . Wrist fracture surgery    . Cataract extraction Bilateral    Family history Family History  Problem Relation Age of Onset  . Breast cancer Cousin   . Pancreatic cancer Paternal Aunt   . Ulcerative colitis Brother   . Colon polyps Maternal Aunt   . Colon cancer Father   . Stroke Mother     Risk Factors: Osteoporosis: postmenopausal estrogen deficiency and dietary calcium and/or vitamin D deficiency History of fracture in the past year: no  Tobacco Social History  Substance Use Topics  . Smoking status: Never Smoker   . Smokeless tobacco: Never Used  . Alcohol Use: No   She does not smoke.  Patient is not a former smoker. Are there smokers in your home (other than you)?  No  Alcohol Current alcohol use: none  Caffeine Current caffeine use: caffeinated soft drinks 1 /day  Exercise  Current exercise: none  Nutrition/Diet Current diet: in general, a "healthy" diet    Cardiac risk factors: advanced age (older than 35 for men, 66 for women), dyslipidemia, hypertension and sedentary lifestyle.  Depression Screen (Note: if answer to either of the following is "Yes",  a more complete depression screening is indicated)   Q1: Over the past two weeks, have you felt down, depressed or hopeless? Yes  Q2: Over the past two weeks, have you felt little interest or pleasure in doing things? No  Have you lost interest or pleasure in daily life? No  Do you often feel hopeless? No  Do you cry easily over simple problems? No  Activities of Daily Living In your present state of health, do you have any difficulty performing the following activities?:  Driving? No Managing money?  No Feeding yourself? No Getting from bed to chair? No Climbing a flight of stairs? No Preparing food and eating?: No Bathing or showering? No Getting dressed: No Getting to the toilet? No Using the  toilet:No Moving around from place to place: No In the past year have you fallen or had a near fall?:No   Are you sexually active?  No  Do you have more than one partner?  No  Vision Difficulties: No  Hearing Difficulties: Yes Do you often ask people to speak up or repeat themselves? Yes Do you experience ringing or noises in your ears? No Do you have difficulty understanding soft or whispered voices? No  Cognition  Do you feel that you have a problem with memory?Yes  Do you often misplace items? No  Do you feel safe at home?  Yes  Advanced directives Does patient have a Kite? Yes Does patient have a Living Will? Yes   Objective:     Blood pressure 108/64, pulse 100, temperature 98.2 F (36.8 C), temperature source Temporal, resp. rate 16, height 5\' 5"  (1.651 m), weight 147 lb (66.679 kg). Body mass index is 24.46 kg/(m^2).  General appearance: alert, no distress, WD/WN,  female Cognitive Testing  Alert? Yes  Normal Appearance?Yes  Oriented to person? Yes  Place? Yes   Time? Yes  Recall of three objects?  Yes  Can perform simple calculations? Yes  Displays appropriate judgment?Yes  Can read the correct time from a watch face?Yes  HEENT: normocephalic, sclerae anicteric, TMs pearly, nares patent, no discharge or erythema, pharynx normal Oral cavity: MMM, no lesions Neck: supple, no lymphadenopathy, no thyromegaly, no masses Heart: RRR, normal S1, S2, no murmurs Lungs: CTA bilaterally, no wheezes, rhonchi, or rales Abdomen: +bs, soft, non tender, non distended, no masses, no hepatomegaly, no splenomegaly Musculoskeletal: nontender, no swelling, no obvious deformity Extremities: no edema, no cyanosis, no clubbing Pulses: 2+ symmetric, upper and lower extremities, normal cap refill Neurological: alert, oriented x 3, CN2-12 intact, strength normal upper extremities and lower extremities, sensation normal throughout, DTRs 2+ throughout, no  cerebellar signs, gait normal Psychiatric: normal affect, behavior normal, pleasant  Breast:  nontender, no masses or lumps, no skin changes, no nipple discharge or inversion, no axillary lymphadenopathy Gyn: defer  Rectal: defer  Medicare Attestation I have personally reviewed: The patient's medical and social history Their use of alcohol, tobacco or illicit drugs Their current medications and supplements The patient's functional ability including ADLs,fall risks, home safety risks, cognitive, and hearing and visual impairment Diet and physical activities Evidence for depression or mood disorders  The patient's weight, height, BMI, and visual acuity have been recorded in the chart.  I have made referrals, counseling, and provided education to the patient based on review of the above and I have provided the patient with a written personalized care plan for preventive services.     Vicie Mutters, PA-C   09/17/2014

## 2014-09-17 NOTE — Patient Instructions (Addendum)
Tumeric with black pepper extract is a great natural antiinflammatory that helps with arthritis and aches and pain. Can get from [employer] or any health food store. Need to take at least 854m twice a day with food.   Can call Dr. FToy Cookeyto get evaluated for dental sleep appliance. # [phone] OR you can try [facility]arence [facility]in [facility]# [phone] We will send notes. Call and get price quote on both.    I think it is possible that you have sleep apnea. It can cause interrupted sleep, headaches, frequent awakenings, fatigue, dry mouth, fast/slow heart beats, memory issues, anxiety/depression, swelling, numbness tingling hands/feet, weight gain, shortness of breath, and the list goes on. Sleep apnea needs to be ruled out because if it is left untreated it does eventually lead to abnormal heart beats, lung failure or heart failure as well as increasing the risk of heart attack and stroke. There are masks you can wear OR a mouth piece that I can give you information about. Often times though people feel MUCH better after getting treatment.   Sleep Apnea  Sleep apnea is a sleep disorder characterized by abnormal pauses in breathing while you sleep. When your breathing pauses, the level of oxygen in your blood decreases. This causes you to move out of deep sleep and into light sleep. As a result, your quality of sleep is poor, and the system that carries your blood throughout your body (cardiovascular system) experiences stress. If sleep apnea remains untreated, the following conditions can develop:  High blood pressure (hypertension).  Coronary artery disease.  Inability to achieve or maintain an erection (impotence).  Impairment of your thought process (cognitive dysfunction). There are three types of sleep apnea: 1. Obstructive sleep apnea--Pauses in breathing during sleep because of a blocked airway. 2. Central sleep apnea--Pauses in breathing during sleep because the area of the  brain that controls your breathing does not send the correct signals to the muscles that control breathing. 3. Mixed sleep apnea--A combination of both obstructive and central sleep apnea.  RISK FACTORS The following risk factors can increase your risk of developing sleep apnea:  Being overweight.  Smoking.  Having narrow passages in your nose and throat.  Being of older age.  Being female.  Alcohol use.  Sedative and tranquilizer use.  Ethnicity. Among individuals younger than 35 years, African Americans are at increased risk of sleep apnea. SYMPTOMS   Difficulty staying asleep.  Daytime sleepiness and fatigue.  Loss of energy.  Irritability.  Loud, heavy snoring.  Morning headaches.  Trouble concentrating.  Forgetfulness.  Decreased interest in sex. DIAGNOSIS  In order to diagnose sleep apnea, your caregiver will perform a physical examination. Your caregiver may suggest that you take a home sleep test. Your caregiver may also recommend that you spend the night in a sleep lab. In the sleep lab, several monitors record information about your heart, lungs, and brain while you sleep. Your leg and arm movements and blood oxygen level are also recorded. TREATMENT The following actions may help to resolve mild sleep apnea:  Sleeping on your side.   Using a decongestant if you have nasal congestion.   Avoiding the use of depressants, including alcohol, sedatives, and narcotics.   Losing weight and modifying your diet if you are overweight. There also are devices and treatments to help open your airway:  Oral appliances. These are custom-made mouthpieces that shift your lower jaw forward and slightly open your  bite. This opens your airway.  Devices that create positive airway pressure. This positive pressure "splints" your airway open to help you breathe better during sleep. The following devices create positive airway pressure:  Continuous positive airway pressure  (CPAP) device. The CPAP device creates a continuous level of air pressure with an air pump. The air is delivered to your airway through a mask while you sleep. This continuous pressure keeps your airway open.  Nasal expiratory positive airway pressure (EPAP) device. The EPAP device creates positive air pressure as you exhale. The device consists of single-use valves, which are inserted into each nostril and held in place by adhesive. The valves create very little resistance when you inhale but create much more resistance when you exhale. That increased resistance creates the positive airway pressure. This positive pressure while you exhale keeps your airway open, making it easier to breath when you inhale again.  Bilevel positive airway pressure (BPAP) device. The BPAP device is used mainly in patients with central sleep apnea. This device is similar to the CPAP device because it also uses an air pump to deliver continuous air pressure through a mask. However, with the BPAP machine, the pressure is set at two different levels. The pressure when you exhale is lower than the pressure when you inhale.  Surgery. Typically, surgery is only done if you cannot comply with less invasive treatments or if the less invasive treatments do not improve your condition. Surgery involves removing excess tissue in your airway to create a wider passage way. Document Released: 01/01/2002 Document Revised: 05/08/2012 Document Reviewed: 05/20/2011 [facility] Patient Information 2015 [place], [place]. This information is not intended to replace advice given to you by your health care provider. Make sure you discuss any questions you have with your health care provider.       Preventive Care for Adults A healthy lifestyle and preventive care can promote health and wellness. Preventive health guidelines for women include the following key practices.  A routine yearly physical is a good way to check with your health care  provider about your health and preventive screening. It is a chance to share any concerns and updates on your health and to receive a thorough exam.  Visit your dentist for a routine exam and preventive care every 6 months. Brush your teeth twice a day and floss once a day. Good oral hygiene prevents tooth decay and gum disease.  The frequency of eye exams is based on your age, health, family medical history, use of contact lenses, and other factors. Follow your health care provider's recommendations for frequency of eye exams.  Eat a healthy diet. Foods like vegetables, fruits, whole grains, low-fat dairy products, and lean protein foods contain the nutrients you need without too many calories. Decrease your intake of foods high in solid fats, added sugars, and salt. Eat the right amount of calories for you.Get information about a proper diet from your health care provider, if necessary.  Regular physical exercise is one of the most important things you can do for your health. Most adults should get at least 150 minutes of moderate-intensity exercise (any activity that increases your heart rate and causes you to sweat) each week. In addition, most adults need muscle-strengthening exercises on 2 or more days a week.  Maintain a healthy weight. The body mass index (BMI) is a screening tool to identify possible weight problems. It provides an estimate of body fat based on height and weight. Your health care provider can find  your BMI and can help you achieve or maintain a healthy weight.For adults 20 years and older:  A BMI below 18.5 is considered underweight.  A BMI of 18.5 to 24.9 is normal.  A BMI of 25 to 29.9 is considered overweight.  A BMI of 30 and above is considered obese.  Maintain normal blood lipids and cholesterol levels by exercising and minimizing your intake of saturated fat. Eat a balanced diet with plenty of fruit and vegetables. If your lipid or cholesterol levels are high,  you are over 50, or you are at high risk for heart disease, you may need your cholesterol levels checked more frequently.Ongoing high lipid and cholesterol levels should be treated with medicines if diet and exercise are not working.  If you smoke, find out from your health care provider how to quit. If you do not use tobacco, do not start.  Lung cancer screening is recommended for adults aged 26-80 years who are at high risk for developing lung cancer because of a history of smoking. A yearly low-dose CT scan of the lungs is recommended for people who have at least a 30-pack-year history of smoking and are a current smoker or have quit within the past 15 years. A pack year of smoking is smoking an average of 1 pack of cigarettes a day for 1 year (for example: 1 pack a day for 30 years or 2 packs a day for 15 years). Yearly screening should continue until the smoker has stopped smoking for at least 15 years. Yearly screening should be stopped for people who develop a health problem that would prevent them from having lung cancer treatment.  Avoid use of street drugs. Do not share needles with anyone. Ask for help if you need support or instructions about stopping the use of drugs.  High blood pressure causes heart disease and increases the risk of stroke.  Ongoing high blood pressure should be treated with medicines if weight loss and exercise do not work.  If you are 52-68 years old, ask your health care provider if you should take aspirin to prevent strokes.  Diabetes screening involves taking a blood sample to check your fasting blood sugar level. This should be done once every 3 years, after age 55, if you are within normal weight and without risk factors for diabetes. Testing should be considered at a younger age or be carried out more frequently if you are overweight and have at least 1 risk factor for diabetes.  Breast cancer screening is essential preventive care for women. You should practice  "breast self-awareness." This means understanding the normal appearance and feel of your breasts and may include breast self-examination. Any changes detected, no matter how small, should be reported to a health care provider. Women in their 67s and 30s should have a clinical breast exam (CBE) by a health care provider as part of a regular health exam every 1 to 3 years. After age 31, women should have a CBE every year. Starting at age 69, women should consider having a mammogram (breast X-ray test) every year. Women who have a family history of breast cancer should talk to their health care provider about genetic screening. Women at a high risk of breast cancer should talk to their health care providers about having an MRI and a mammogram every year.  Breast cancer gene (BRCA)-related cancer risk assessment is recommended for women who have family members with BRCA-related cancers. BRCA-related cancers include breast, ovarian, tubal, and peritoneal cancers.  Having family members with these cancers may be associated with an increased risk for harmful changes (mutations) in the breast cancer genes BRCA1 and BRCA2. Results of the assessment will determine the need for genetic counseling and BRCA1 and BRCA2 testing.  Routine pelvic exams to screen for cancer are no longer recommended for nonpregnant women who are considered low risk for cancer of the pelvic organs (ovaries, uterus, and vagina) and who do not have symptoms. Ask your health care provider if a screening pelvic exam is right for you.  If you have had past treatment for cervical cancer or a condition that could lead to cancer, you need Pap tests and screening for cancer for at least 20 years after your treatment. If Pap tests have been discontinued, your risk factors (such as having a new sexual partner) need to be reassessed to determine if screening should be resumed. Some women have medical problems that increase the chance of getting cervical  cancer. In these cases, your health care provider may recommend more frequent screening and Pap tests.    Colorectal cancer can be detected and often prevented. Most routine colorectal cancer screening begins at the age of 29 years and continues through age 2 years. However, your health care provider may recommend screening at an earlier age if you have risk factors for colon cancer. On a yearly basis, your health care provider may provide home test kits to check for hidden blood in the stool. Use of a small camera at the end of a tube, to directly examine the colon (sigmoidoscopy or colonoscopy), can detect the earliest forms of colorectal cancer. Talk to your health care provider about this at age 52, when routine screening begins. Direct exam of the colon should be repeated every 5-10 years through age 25 years, unless early forms of pre-cancerous polyps or small growths are found.  Osteoporosis is a disease in which the bones lose minerals and strength with aging. This can result in serious bone fractures or breaks. The risk of osteoporosis can be identified using a bone density scan. Women ages 55 years and over and women at risk for fractures or osteoporosis should discuss screening with their health care providers. Ask your health care provider whether you should take a calcium supplement or vitamin D to reduce the rate of osteoporosis.  Menopause can be associated with physical symptoms and risks. Hormone replacement therapy is available to decrease symptoms and risks. You should talk to your health care provider about whether hormone replacement therapy is right for you.  Use sunscreen. Apply sunscreen liberally and repeatedly throughout the day. You should seek shade when your shadow is shorter than you. Protect yourself by wearing long sleeves, pants, a wide-brimmed hat, and sunglasses year round, whenever you are outdoors.  Once a month, do a whole body skin exam, using a mirror to look at  the skin on your back. Tell your health care provider of new moles, moles that have irregular borders, moles that are larger than a pencil eraser, or moles that have changed in shape or color.  Stay current with required vaccines (immunizations).  Influenza vaccine. All adults should be immunized every year.  Tetanus, diphtheria, and acellular pertussis (Td, Tdap) vaccine. Pregnant women should receive 1 dose of Tdap vaccine during each pregnancy. The dose should be obtained regardless of the length of time since the last dose. Immunization is preferred during the 27th-36th week of gestation. An adult who has not previously received Tdap or who does  not know her vaccine status should receive 1 dose of Tdap. This initial dose should be followed by tetanus and diphtheria toxoids (Td) booster doses every 10 years. Adults with an unknown or incomplete history of completing a 3-dose immunization series with Td-containing vaccines should begin or complete a primary immunization series including a Tdap dose. Adults should receive a Td booster every 10 years.    Zoster vaccine. One dose is recommended for adults aged 61 years or older unless certain conditions are present.    Pneumococcal 13-valent conjugate (PCV13) vaccine. When indicated, a person who is uncertain of her immunization history and has no record of immunization should receive the PCV13 vaccine. An adult aged 73 years or older who has certain medical conditions and has not been previously immunized should receive 1 dose of PCV13 vaccine. This PCV13 should be followed with a dose of pneumococcal polysaccharide (PPSV23) vaccine. The PPSV23 vaccine dose should be obtained at least 8 weeks after the dose of PCV13 vaccine. An adult aged 45 years or older who has certain medical conditions and previously received 1 or more doses of PPSV23 vaccine should receive 1 dose of PCV13. The PCV13 vaccine dose should be obtained 1 or more years after the last  PPSV23 vaccine dose.    Pneumococcal polysaccharide (PPSV23) vaccine. When PCV13 is also indicated, PCV13 should be obtained first. All adults aged 43 years and older should be immunized. An adult younger than age 26 years who has certain medical conditions should be immunized. Any person who resides in a nursing home or long-term care facility should be immunized. An adult smoker should be immunized. People with an immunocompromised condition and certain other conditions should receive both PCV13 and PPSV23 vaccines. People with human immunodeficiency virus (HIV) infection should be immunized as soon as possible after diagnosis. Immunization during chemotherapy or radiation therapy should be avoided. Routine use of PPSV23 vaccine is not recommended for American Indians, [place] Natives, or people younger than 65 years unless there are medical conditions that require PPSV23 vaccine. When indicated, people who have unknown immunization and have no record of immunization should receive PPSV23 vaccine. One-time revaccination 5 years after the first dose of PPSV23 is recommended for people aged 19-64 years who have chronic kidney failure, nephrotic syndrome, asplenia, or immunocompromised conditions. People who received 1-2 doses of PPSV23 before age 23 years should receive another dose of PPSV23 vaccine at age 18 years or later if at least 5 years have passed since the previous dose. Doses of PPSV23 are not needed for people immunized with PPSV23 at or after age 69 years.   Preventive Services / Frequency  Ages 41 years and over  Blood pressure check.  Lipid and cholesterol check.  Lung cancer screening. / Every year if you are aged 93-80 years and have a 30-pack-year history of smoking and currently smoke or have quit within the past 15 years. Yearly screening is stopped once you have quit smoking for at least 15 years or develop a health problem that would prevent you from having lung cancer  treatment.  Clinical breast exam.** / Every year after age 77 years.  BRCA-related cancer risk assessment.** / For women who have family members with a BRCA-related cancer (breast, ovarian, tubal, or peritoneal cancers).  Mammogram.** / Every year beginning at age 86 years and continuing for as long as you are in good health. Consult with your health care provider.  Pap test.** / Every 3 years starting at age 27 years through  age 75 or 70 years with 3 consecutive normal Pap tests. Testing can be stopped between 65 and 70 years with 3 consecutive normal Pap tests and no abnormal Pap or HPV tests in the past 10 years.  Fecal occult blood test (FOBT) of stool. / Every year beginning at age 49 years and continuing until age 69 years. You may not need to do this test if you get a colonoscopy every 10 years.  Flexible sigmoidoscopy or colonoscopy.** / Every 5 years for a flexible sigmoidoscopy or every 10 years for a colonoscopy beginning at age 22 years and continuing until age 18 years.  Hepatitis C blood test.** / For all people born from [birth year] through 1963 and any individual with known risks for hepatitis C.  Osteoporosis screening.** / A one-time screening for women ages 46 years and over and women at risk for fractures or osteoporosis.  Skin self-exam. / Monthly.  Influenza vaccine. / Every year.  Tetanus, diphtheria, and acellular pertussis (Tdap/Td) vaccine.** / 1 dose of Td every 10 years.  Zoster vaccine.** / 1 dose for adults aged 80 years or older.  Pneumococcal 13-valent conjugate (PCV13) vaccine.** / Consult your health care provider.  Pneumococcal polysaccharide (PPSV23) vaccine.** / 1 dose for all adults aged 70 years and older. Screening for abdominal aortic aneurysm (AAA)  by ultrasound is recommended for people who have history of high blood pressure or who are current or former smokers.  Osteoporosis Osteoporosis happens when your bones become weak because of bone loss.  Weak bones can break (fracture) more easily with slips or falls. You are more likely to develop osteoporosis if:  You are a woman.  You are older than 50 years.  You are white or Asian.  You are very thin.  Someone in your family has had osteoporosis.  You smoke or use nicotine. CAUSES   Smoking.  Too much drinking.  Being a weight below normal.  Not being active.  Not going outside in the sun enough.  Certain medical conditions, such as diabetes or Crohn disease.  Certain medicines, such as steroids or antiseizure medicines. TREATMENT  The goal of treatment is to strengthen bones. There are different types of medicines that help your bones. Some medicines make your bones more solid. Some medicines help to slow down how much bone you lose. Your doctor may check to see if you are getting enough calcium and vitamin D in your diet. PREVENTION   Make sure you get enough calcium and vitamin D.  Make sure you exercise often.  If you smoke, quit. MAKE SURE YOU:  Understand these instructions.  Will watch your condition.  Will get help right away if you are not doing well or get worse. Document Released: 04/05/2011 Document Reviewed: 04/05/2011 [facility] Patient Information 2015 [place]. This information is not intended to replace advice given to you by your health care provider. Make sure you discuss any questions you have with your health care provider.

## 2014-09-18 LAB — HEPATIC FUNCTION PANEL
ALT: 12 U/L (ref 6–29)
AST: 15 U/L (ref 10–35)
Albumin: 4.1 g/dL (ref 3.6–5.1)
Alkaline Phosphatase: 82 U/L (ref 33–130)
Bilirubin, Direct: 0.1 mg/dL (ref <=0.2)
Indirect Bilirubin: 0.2 mg/dL (ref 0.2–1.2)
TOTAL PROTEIN: 6.9 g/dL (ref 6.1–8.1)
Total Bilirubin: 0.3 mg/dL (ref 0.2–1.2)

## 2014-09-18 LAB — MICROALBUMIN / CREATININE URINE RATIO
Creatinine, Urine: 37.4 mg/dL
Microalb Creat Ratio: 8 mg/g (ref 0.0–30.0)
Microalb, Ur: 0.3 mg/dL (ref <2.0)

## 2014-09-18 LAB — LIPID PANEL
CHOL/HDL RATIO: 3.1 ratio (ref <=5.0)
Cholesterol: 212 mg/dL — ABNORMAL HIGH (ref 125–200)
HDL: 69 mg/dL (ref >=46)
LDL Cholesterol: 116 mg/dL (ref <130)
Triglycerides: 135 mg/dL (ref <150)
VLDL: 27 mg/dL (ref <30)

## 2014-09-18 LAB — URINALYSIS, ROUTINE W REFLEX MICROSCOPIC
BILIRUBIN URINE: NEGATIVE
GLUCOSE, UA: NEGATIVE
Hgb urine dipstick: NEGATIVE
Ketones, ur: NEGATIVE
Leukocytes, UA: NEGATIVE
Nitrite: NEGATIVE
PH: 6 (ref 5.0–8.0)
PROTEIN: NEGATIVE
Specific Gravity, Urine: 1.005 (ref 1.001–1.035)

## 2014-09-18 LAB — IRON AND TIBC
%SAT: 5 % — AB (ref 20–55)
IRON: 25 ug/dL — AB (ref 42–145)
TIBC: 532 ug/dL — AB (ref 250–470)
UIBC: 507 ug/dL — ABNORMAL HIGH (ref 125–400)

## 2014-09-18 LAB — BASIC METABOLIC PANEL WITH GFR
BUN: 10 mg/dL (ref 7–25)
CALCIUM: 9.1 mg/dL (ref 8.6–10.4)
CO2: 27 mmol/L (ref 20–31)
CREATININE: 0.79 mg/dL (ref 0.60–0.93)
Chloride: 104 mmol/L (ref 98–110)
GFR, EST AFRICAN AMERICAN: 84 mL/min (ref >=60)
GFR, Est Non African American: 73 mL/min (ref >=60)
GLUCOSE: 65 mg/dL (ref 65–99)
Potassium: 3.4 mmol/L — ABNORMAL LOW (ref 3.5–5.3)
SODIUM: 140 mmol/L (ref 135–146)

## 2014-09-18 LAB — INSULIN, FASTING: INSULIN FASTING, SERUM: 11.5 u[IU]/mL (ref 2.0–19.6)

## 2014-09-18 LAB — FERRITIN: FERRITIN: 8 ng/mL — AB (ref 10–291)

## 2014-09-18 LAB — VITAMIN B12: Vitamin B-12: 296 pg/mL (ref 211–911)

## 2014-09-18 LAB — VITAMIN D 25 HYDROXY (VIT D DEFICIENCY, FRACTURES): VIT D 25 HYDROXY: 30 ng/mL (ref 30–100)

## 2014-09-18 LAB — TSH: TSH: 1.259 u[IU]/mL (ref 0.350–4.500)

## 2014-09-18 LAB — MAGNESIUM: MAGNESIUM: 2 mg/dL (ref 1.5–2.5)

## 2014-09-18 MED ORDER — INTEGRA F 125-1 MG PO CAPS: 1.0000 | ORAL_CAPSULE | Freq: Every day | ORAL | Status: DC

## 2014-09-18 NOTE — Addendum Note (Signed)
Addended by: Vicie Mutters R on: 09/18/2014 09:03 AM   Modules accepted: Orders

## 2014-09-25 ENCOUNTER — Other Ambulatory Visit: Payer: Self-pay

## 2014-09-25 DIAGNOSIS — Z1231 Encounter for screening mammogram for malignant neoplasm of breast: Secondary | ICD-10-CM

## 2014-10-02 ENCOUNTER — Ambulatory Visit: Payer: Self-pay | Admitting: Physician Assistant

## 2014-10-07 ENCOUNTER — Other Ambulatory Visit: Payer: Medicare Other

## 2014-10-07 DIAGNOSIS — D509 Iron deficiency anemia, unspecified: Secondary | ICD-10-CM | POA: Diagnosis not present

## 2014-10-07 DIAGNOSIS — E876 Hypokalemia: Secondary | ICD-10-CM

## 2014-10-07 LAB — CBC WITH DIFFERENTIAL/PLATELET
Basophils Absolute: 0.1 10*3/uL (ref 0.0–0.1)
Basophils Relative: 1 % (ref 0–1)
EOS ABS: 0 10*3/uL (ref 0.0–0.7)
EOS PCT: 0 % (ref 0–5)
HCT: 30.5 % — ABNORMAL LOW (ref 36.0–46.0)
Hemoglobin: 9.2 g/dL — ABNORMAL LOW (ref 12.0–15.0)
LYMPHS ABS: 1.4 10*3/uL (ref 0.7–4.0)
Lymphocytes Relative: 27 % (ref 12–46)
MCH: 21.3 pg — AB (ref 26.0–34.0)
MCHC: 30.2 g/dL (ref 30.0–36.0)
MCV: 70.8 fL — AB (ref 78.0–100.0)
MONO ABS: 0.4 10*3/uL (ref 0.1–1.0)
MONOS PCT: 7 % (ref 3–12)
MPV: 9.5 fL (ref 8.6–12.4)
NEUTROS PCT: 65 % (ref 43–77)
Neutro Abs: 3.3 10*3/uL (ref 1.7–7.7)
PLATELETS: 361 10*3/uL (ref 150–400)
RBC: 4.31 MIL/uL (ref 3.87–5.11)
RDW: 20.5 % — ABNORMAL HIGH (ref 11.5–15.5)
WBC: 5.1 10*3/uL (ref 4.0–10.5)

## 2014-10-07 LAB — BASIC METABOLIC PANEL WITH GFR
BUN: 15 mg/dL (ref 7–25)
CALCIUM: 9.1 mg/dL (ref 8.6–10.4)
CO2: 24 mmol/L (ref 20–31)
CREATININE: 0.65 mg/dL (ref 0.60–0.93)
Chloride: 103 mmol/L (ref 98–110)
GFR, Est African American: >89 mL/min (ref >=60)
GFR, Est Non African American: 87 mL/min (ref >=60)
GLUCOSE: 109 mg/dL — AB (ref 65–99)
Potassium: 3.9 mmol/L (ref 3.5–5.3)
Sodium: 138 mmol/L (ref 135–146)

## 2014-10-07 LAB — IRON AND TIBC
%SAT: 6 % — ABNORMAL LOW (ref 11–50)
IRON: 28 ug/dL — AB (ref 45–160)
TIBC: 466 ug/dL — ABNORMAL HIGH (ref 250–450)
UIBC: 438 ug/dL — AB (ref 125–400)

## 2014-10-07 LAB — FERRITIN: FERRITIN: 24 ng/mL (ref 10–291)

## 2014-10-21 ENCOUNTER — Other Ambulatory Visit: Payer: Self-pay

## 2014-10-21 ENCOUNTER — Ambulatory Visit
Admission: RE | Admit: 2014-10-21 | Discharge: 2014-10-21 | Disposition: A | Discharge status: Home / Self Care | Payer: Medicare Other | Source: Ambulatory Visit

## 2014-10-21 DIAGNOSIS — Z1231 Encounter for screening mammogram for malignant neoplasm of breast: Secondary | ICD-10-CM | POA: Diagnosis not present

## 2014-12-23 ENCOUNTER — Encounter: Payer: Self-pay | Admitting: Physician Assistant

## 2014-12-23 ENCOUNTER — Ambulatory Visit (INDEPENDENT_AMBULATORY_CARE_PROVIDER_SITE_OTHER): Payer: Medicare Other | Admitting: Physician Assistant

## 2014-12-23 VITALS — BP 120/70 | HR 96 | Temp 97.7°F | Resp 14 | Ht 65.0 in | Wt 144.0 lb

## 2014-12-23 DIAGNOSIS — F419 Anxiety disorder, unspecified: Secondary | ICD-10-CM | POA: Diagnosis not present

## 2014-12-23 DIAGNOSIS — R7309 Other abnormal glucose: Secondary | ICD-10-CM | POA: Diagnosis not present

## 2014-12-23 DIAGNOSIS — I10 Essential (primary) hypertension: Secondary | ICD-10-CM | POA: Diagnosis not present

## 2014-12-23 DIAGNOSIS — Z79899 Other long term (current) drug therapy: Secondary | ICD-10-CM | POA: Diagnosis not present

## 2014-12-23 DIAGNOSIS — E785 Hyperlipidemia, unspecified: Secondary | ICD-10-CM

## 2014-12-23 DIAGNOSIS — Z1389 Encounter for screening for other disorder: Secondary | ICD-10-CM | POA: Diagnosis not present

## 2014-12-23 DIAGNOSIS — D509 Iron deficiency anemia, unspecified: Secondary | ICD-10-CM

## 2014-12-23 DIAGNOSIS — Z1331 Encounter for screening for depression: Secondary | ICD-10-CM

## 2014-12-23 DIAGNOSIS — E559 Vitamin D deficiency, unspecified: Secondary | ICD-10-CM | POA: Diagnosis not present

## 2014-12-23 DIAGNOSIS — R7303 Prediabetes: Secondary | ICD-10-CM | POA: Diagnosis not present

## 2014-12-23 LAB — LIPID PANEL
Cholesterol: 229 mg/dL — ABNORMAL HIGH (ref 125–200)
HDL: 79 mg/dL (ref >=46)
LDL Cholesterol: 125 mg/dL (ref <130)
Total CHOL/HDL Ratio: 2.9 Ratio (ref <=5.0)
Triglycerides: 124 mg/dL (ref <150)
VLDL: 25 mg/dL (ref <30)

## 2014-12-23 LAB — IRON AND TIBC
%SAT: 9 % — ABNORMAL LOW (ref 11–50)
IRON: 43 ug/dL — AB (ref 45–160)
TIBC: 468 ug/dL — ABNORMAL HIGH (ref 250–450)
UIBC: 425 ug/dL — ABNORMAL HIGH (ref 125–400)

## 2014-12-23 LAB — BASIC METABOLIC PANEL WITH GFR
BUN: 13 mg/dL (ref 7–25)
CALCIUM: 9.1 mg/dL (ref 8.6–10.4)
CO2: 27 mmol/L (ref 20–31)
Chloride: 100 mmol/L (ref 98–110)
Creat: 0.68 mg/dL (ref 0.60–0.93)
GFR, Est Non African American: 85 mL/min (ref >=60)
GLUCOSE: 119 mg/dL — AB (ref 65–99)
Potassium: 4.3 mmol/L (ref 3.5–5.3)
Sodium: 136 mmol/L (ref 135–146)

## 2014-12-23 LAB — CBC WITH DIFFERENTIAL/PLATELET
Basophils Absolute: 0 10*3/uL (ref 0.0–0.1)
Basophils Relative: 0 % (ref 0–1)
EOS ABS: 0 10*3/uL (ref 0.0–0.7)
EOS PCT: 0 % (ref 0–5)
HEMATOCRIT: 38.3 % (ref 36.0–46.0)
Hemoglobin: 12.1 g/dL (ref 12.0–15.0)
LYMPHS ABS: 1.3 10*3/uL (ref 0.7–4.0)
LYMPHS PCT: 27 % (ref 12–46)
MCH: 23.7 pg — ABNORMAL LOW (ref 26.0–34.0)
MCHC: 31.6 g/dL (ref 30.0–36.0)
MCV: 75.1 fL — AB (ref 78.0–100.0)
MONO ABS: 0.4 10*3/uL (ref 0.1–1.0)
MPV: 10.2 fL (ref 8.6–12.4)
Monocytes Relative: 9 % (ref 3–12)
Neutro Abs: 3 10*3/uL (ref 1.7–7.7)
Neutrophils Relative %: 64 % (ref 43–77)
Platelets: 341 10*3/uL (ref 150–400)
RBC: 5.1 MIL/uL (ref 3.87–5.11)
RDW: 18.6 % — AB (ref 11.5–15.5)
WBC: 4.7 10*3/uL (ref 4.0–10.5)

## 2014-12-23 LAB — HEMOGLOBIN A1C
HEMOGLOBIN A1C: 5.8 % — AB (ref <5.7)
MEAN PLASMA GLUCOSE: 120 mg/dL — AB (ref <117)

## 2014-12-23 LAB — HEPATIC FUNCTION PANEL
ALBUMIN: 4.4 g/dL (ref 3.6–5.1)
ALT: 13 U/L (ref 6–29)
AST: 20 U/L (ref 10–35)
Alkaline Phosphatase: 92 U/L (ref 33–130)
Bilirubin, Direct: <0.1 mg/dL (ref <=0.2)
TOTAL PROTEIN: 7.1 g/dL (ref 6.1–8.1)
Total Bilirubin: 0.3 mg/dL (ref 0.2–1.2)

## 2014-12-23 LAB — MAGNESIUM: MAGNESIUM: 2.1 mg/dL (ref 1.5–2.5)

## 2014-12-23 LAB — FERRITIN: Ferritin: 9 ng/mL — ABNORMAL LOW (ref 10–291)

## 2014-12-23 LAB — TSH: TSH: 1.842 u[IU]/mL (ref 0.350–4.500)

## 2014-12-23 NOTE — Progress Notes (Signed)
Assessment and Plan:  1. Hypertension -Continue medication, monitor blood pressure at home. Continue DASH diet.  Reminder to go to the ER if any CP, SOB, nausea, dizziness, severe HA, changes vision/speech, left arm numbness and tingling and jaw pain.  2. Cholesterol -Continue diet and exercise. Check cholesterol.   3. Prediabetes  -Continue diet and exercise. Check A1C  4. Vitamin D Def - check level and continue medications.   5. Anemia - monitor, continue iron supp with Vitamin C and increase green leafy veggies  6. Depression - continue medications, stress management techniques discussed, increase water, good sleep hygiene discussed, increase exercise, and increase veggies.    Continue diet and meds as discussed. Further disposition pending results of labs. Over 30 minutes of exam, counseling, chart review, and critical decision making was performed  HPI 79 y.o. female  presents for 3 month follow up on hypertension, cholesterol, prediabetes, and vitamin D deficiency.   Her blood pressure has been controlled at home, today their BP is BP: 120/70 mmHg  She does not workout. She denies chest pain, shortness of breath, dizziness.  She is on cholesterol medication and denies myalgias. Her cholesterol is not at goal. The cholesterol last visit was:   Lab Results  Component Value Date   CHOL 212* 09/17/2014   HDL 69 09/17/2014   LDLCALC 116 09/17/2014   TRIG 135 09/17/2014   CHOLHDL 3.1 09/17/2014    She has been working on diet and exercise for prediabetes, and denies paresthesia of the feet, polydipsia, polyuria and visual disturbances. Last A1C in the office was:  Lab Results  Component Value Date   HGBA1C 5.8* 09/17/2014   Patient is on Vitamin D supplement.   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 She was on  integra and following with Dr. Julien Nordmann but she has been released from his practice, she has run out of integra for last 2 months. She has fatigue and some mild shortness of breath.  Husband diagnosed with parkinson's and he is declining very quickly, she has some depression for this, has increased effexor to two a day which helps.   Current Medications:  Current Outpatient Prescriptions on File Prior to Visit  Medication Sig Dispense Refill  . Fe Fum-FePoly-FA-Vit C-Vit B3 (INTEGRA F) 125-1 MG CAPS Take 1 capsule by mouth daily. 30 capsule 3  . LORazepam (ATIVAN) 1 MG tablet Take 1 mg by mouth every 8 (eight) hours.      Marland Kitchen omeprazole (PRILOSEC) 40 MG capsule Take 1 capsule (40 mg total) by mouth daily. 90 capsule 3  . traZODone (DESYREL) 50 MG tablet     . venlafaxine (EFFEXOR-XR) 75 MG 24 hr capsule Take 75 mg by mouth daily.       Current Facility-Administered Medications on File Prior to Visit  Medication Dose Route Frequency Provider Last Rate Last Dose  . 0.9 %  sodium chloride infusion  500 mL Intravenous Continuous Lafayette Dragon, MD       Medical History:  Past Medical History  Diagnosis Date  . Anemia   . Hyperlipidemia   . Arthritis   . Anxiety   . Depression   . Allergic rhinitis   . Sleep apnea   . Hypertension   . Prediabetes   . Migraine   . GERD (gastroesophageal reflux  disease)   . Vitamin D deficiency    Allergies:  Allergies  Allergen Reactions  . Morphine And Related     Review of Systems:  Review of Systems  Constitutional: Positive for malaise/fatigue. Negative for fever, chills, weight loss and diaphoresis.  HENT: Negative.   Eyes: Negative.   Respiratory: Negative.   Cardiovascular: Negative.   Gastrointestinal: Negative.   Genitourinary: Negative.   Musculoskeletal: Negative.   Skin: Negative.   Neurological: Negative.  Negative for weakness.  Psychiatric/Behavioral: Negative.     Family history- Review and unchanged Social history- Review  and unchanged Physical Exam: BP 120/70 mmHg  Pulse 96  Temp(Src) 97.7 F (36.5 C) (Temporal)  Resp 14  Ht 5\' 5"  (1.651 m)  Wt 144 lb (65.318 kg)  BMI 23.96 kg/m2  SpO2 98% Wt Readings from Last 3 Encounters:  12/23/14 144 lb (65.318 kg)  09/17/14 147 lb (66.679 kg)  02/11/14 143 lb 8 oz (65.091 kg)   General Appearance: Well nourished, in no apparent distress. Eyes: PERRLA, EOMs, conjunctiva no swelling or erythema Sinuses: No Frontal/maxillary tenderness ENT/Mouth: Ext aud canals clear, TMs without erythema, bulging. No erythema, swelling, or exudate on post pharynx.  Tonsils not swollen or erythematous. Hearing normal.  Neck: Supple, thyroid normal.  Respiratory: Respiratory effort normal, BS equal bilaterally without rales, rhonchi, wheezing or stridor.  Cardio: RRR with no MRGs. Brisk peripheral pulses without edema.  Abdomen: Soft, + BS,  Non tender, no guarding, rebound, hernias, masses. Lymphatics: Non tender without lymphadenopathy.  Musculoskeletal: Full ROM, 5/5 strength, Normal gait Skin: Warm, dry without rashes, lesions, ecchymosis.  Neuro: Cranial nerves intact. Normal muscle tone, no cerebellar symptoms. Psych: Awake and oriented X 3, normal affect, Insight and Judgment appropriate.    Vicie Mutters, PA-C 10:56 AM [facility]

## 2014-12-23 NOTE — Patient Instructions (Addendum)
Tumeric with black pepper extract is a great natural antiinflammatory that helps with arthritis and aches and pain. Can get from [employer] or any health food store. Need to take at least 800mg  twice a day with food.    Benefiber is good for constipation/diarrhea/irritable bowel syndrome, it helps with weight loss and can help lower your bad cholesterol. Please do 1-2 TBSP in the morning in water, coffee, or tea. It can take up to a month before you can see a difference with your bowel movements. It is cheapest from [employer], [employer], [employer].   Can try benefiber or can try adding on probiotic to help reduce gas.    10 Tips on Belching, Bloating, and Flatulence 1. Belching is caused by swallowed air from:  Eating or drinking too fast  Poorly fitting dentures; not chewing food completely  Carbonated beverages  Chewing gum or sucking on hard candies  Excessive swallowing due to nervous tension or postnasal drip  Forced belching to relieve abdominal discomfort 2. To prevent excessive belching, avoid:  Carbonated beverages  Chewing gum  Hard candies  Simethicone/GasX may be helpful  3. Abdominal bloating and discomfort may be due to intestinal sensitivity or symptoms of irritable bowel syndrome. To relieve symptoms, avoid:  Broccoli  Baked beans  Cabbage  Carbonated drinks  Cauliflower  Chewing gum  Hard candy 4. Abdominal distention resulting from weak abdominal muscles:  Is better in the morning  Gets worse as the day progresses  Is relieved by lying down 5. To prevent Abdominal distention:  Tighten abdominal muscles by pulling in your stomach several times during the day  Do sit-up exercises if possible  Wear an abdominal support garment if exercise is too difficult 6. Flatulence is gas created through bacterial action in the bowel and passed rectally. Keep in mind that:  10-18 passages per day are normal  Primary gases are harmless and odorless  Noticeable smells are trace gases  related to food intake 7. Foods that are likely to form gas include:  Milk, dairy products, and medications that contain lactose--If your body doesn't produce the enzyme (lactase) to break it down.  Certain vegetables--baked beans, cauliflower, broccoli, cabbage  Certain starches--wheat, oats, corn, potatoes. Rice is a good substitute. 8. Identify offending foods. Reduce or eliminate these gas-forming foods from your diet.   Anemia, Nonspecific Anemia is a condition in which the concentration of red blood cells or hemoglobin in the blood is below normal. Hemoglobin is a substance in red blood cells that carries oxygen to the tissues of the body. Anemia results in not enough oxygen reaching these tissues.  CAUSES  Common causes of anemia include:   Excessive bleeding. Bleeding may be internal or external. This includes excessive bleeding from periods (in women) or from the intestine.   Poor nutrition.   Chronic kidney, thyroid, and liver disease.  Bone marrow disorders that decrease red blood cell production.  Cancer and treatments for cancer.  HIV, AIDS, and their treatments.  Spleen problems that increase red blood cell destruction.  Blood disorders.  Excess destruction of red blood cells due to infection, medicines, and autoimmune disorders. SIGNS AND SYMPTOMS   Minor weakness.   Dizziness.   Headache.  Palpitations.   Shortness of breath, especially with exercise.   Paleness.  Cold sensitivity.  Indigestion.  Nausea.  Difficulty sleeping.  Difficulty concentrating. Symptoms may occur suddenly or they may develop slowly.  DIAGNOSIS  Additional blood tests are often needed. These help your health care provider  determine the best treatment. Your health care provider will check your stool for blood and look for other causes of blood loss.  TREATMENT  Treatment varies depending on the cause of the anemia. Treatment can include:   Supplements of iron,  vitamin 123456, or folic acid.   Hormone medicines.   A blood transfusion. This may be needed if blood loss is severe.   Hospitalization. This may be needed if there is significant continual blood loss.   Dietary changes.  Spleen removal. HOME CARE INSTRUCTIONS Keep all follow-up appointments. It often takes many weeks to correct anemia, and having your health care provider check on your condition and your response to treatment is very important. SEEK IMMEDIATE MEDICAL CARE IF:   You develop extreme weakness, shortness of breath, or chest pain.   You become dizzy or have trouble concentrating.  You develop heavy vaginal bleeding.   You develop a rash.   You have bloody or black, tarry stools.   You faint.   You vomit up blood.   You vomit repeatedly.   You have abdominal pain.  You have a fever or persistent symptoms for more than 2-3 days.   You have a fever and your symptoms suddenly get worse.   You are dehydrated.  MAKE SURE YOU:  Understand these instructions.  Will watch your condition.  Will get help right away if you are not doing well or get worse.   This information is not intended to replace advice given to you by your health care provider. Make sure you discuss any questions you have with your health care provider.   Document Released: 02/19/2004 Document Revised: 09/13/2012 Document Reviewed: 07/07/2012 Elsevier Interactive Patient Education [employer].

## 2014-12-24 LAB — VITAMIN D 25 HYDROXY (VIT D DEFICIENCY, FRACTURES): VIT D 25 HYDROXY: 35 ng/mL (ref 30–100)

## 2015-02-15 DIAGNOSIS — Z79899 Other long term (current) drug therapy: Secondary | ICD-10-CM | POA: Diagnosis not present

## 2015-02-15 DIAGNOSIS — J449 Chronic obstructive pulmonary disease, unspecified: Secondary | ICD-10-CM | POA: Diagnosis not present

## 2015-02-15 DIAGNOSIS — R0789 Other chest pain: Secondary | ICD-10-CM | POA: Diagnosis not present

## 2015-02-15 DIAGNOSIS — K449 Diaphragmatic hernia without obstruction or gangrene: Secondary | ICD-10-CM | POA: Diagnosis not present

## 2015-02-15 DIAGNOSIS — R079 Chest pain, unspecified: Secondary | ICD-10-CM | POA: Diagnosis not present

## 2015-02-15 DIAGNOSIS — D649 Anemia, unspecified: Secondary | ICD-10-CM | POA: Diagnosis not present

## 2015-02-18 ENCOUNTER — Ambulatory Visit (INDEPENDENT_AMBULATORY_CARE_PROVIDER_SITE_OTHER): Payer: Medicare Other | Admitting: Physician Assistant

## 2015-02-18 ENCOUNTER — Encounter: Payer: Self-pay | Admitting: Physician Assistant

## 2015-02-18 ENCOUNTER — Other Ambulatory Visit: Payer: Self-pay

## 2015-02-18 VITALS — BP 122/74 | HR 106 | Temp 98.1°F | Resp 16 | Ht 64.0 in | Wt 144.4 lb

## 2015-02-18 DIAGNOSIS — D509 Iron deficiency anemia, unspecified: Secondary | ICD-10-CM | POA: Diagnosis not present

## 2015-02-18 DIAGNOSIS — Z79899 Other long term (current) drug therapy: Secondary | ICD-10-CM | POA: Diagnosis not present

## 2015-02-18 DIAGNOSIS — Z86010 Personal history of colonic polyps: Secondary | ICD-10-CM

## 2015-02-18 DIAGNOSIS — I10 Essential (primary) hypertension: Secondary | ICD-10-CM

## 2015-02-18 DIAGNOSIS — E559 Vitamin D deficiency, unspecified: Secondary | ICD-10-CM | POA: Diagnosis not present

## 2015-02-18 DIAGNOSIS — K449 Diaphragmatic hernia without obstruction or gangrene: Secondary | ICD-10-CM

## 2015-02-18 DIAGNOSIS — Z0000 Encounter for general adult medical examination without abnormal findings: Secondary | ICD-10-CM

## 2015-02-18 DIAGNOSIS — F419 Anxiety disorder, unspecified: Secondary | ICD-10-CM

## 2015-02-18 DIAGNOSIS — Z0001 Encounter for general adult medical examination with abnormal findings: Secondary | ICD-10-CM | POA: Diagnosis not present

## 2015-02-18 DIAGNOSIS — M810 Age-related osteoporosis without current pathological fracture: Secondary | ICD-10-CM

## 2015-02-18 DIAGNOSIS — E785 Hyperlipidemia, unspecified: Secondary | ICD-10-CM | POA: Diagnosis not present

## 2015-02-18 DIAGNOSIS — R7303 Prediabetes: Secondary | ICD-10-CM

## 2015-02-18 DIAGNOSIS — R6889 Other general symptoms and signs: Secondary | ICD-10-CM

## 2015-02-18 DIAGNOSIS — Z860101 Personal history of adenomatous and serrated colon polyps: Secondary | ICD-10-CM

## 2015-02-18 MED ORDER — INTEGRA F 125-1 MG PO CAPS: 1.0000 | ORAL_CAPSULE | Freq: Every day | ORAL | Status: DC

## 2015-02-18 MED ORDER — SUCRALFATE 1 GM/10ML PO SUSP: 1.0000 g | Freq: Two times a day (BID) | ORAL | Status: DC

## 2015-02-18 NOTE — Patient Instructions (Signed)
Food Choices for Gastroesophageal Reflux Disease, Adult When you have gastroesophageal reflux disease (GERD), the foods you eat and your eating habits are very important. Choosing the right foods can help ease the discomfort of GERD. WHAT GENERAL GUIDELINES DO I NEED TO FOLLOW?  Choose fruits, vegetables, whole grains, low-fat dairy products, and low-fat meat, fish, and poultry.  Limit fats such as oils, salad dressings, butter, nuts, and avocado.  Keep a food diary to identify foods that cause symptoms.  Avoid foods that cause reflux. These may be different for different people.  Eat frequent small meals instead of three large meals each day.  Eat your meals slowly, in a relaxed setting.  Limit fried foods.  Cook foods using methods other than frying.  Avoid drinking alcohol.  Avoid drinking large amounts of liquids with your meals.  Avoid bending over or lying down until 2-3 hours after eating. WHAT FOODS ARE NOT RECOMMENDED? The following are some foods and drinks that may worsen your symptoms: Vegetables Tomatoes. Tomato juice. Tomato and spaghetti sauce. Chili peppers. Onion and garlic. Horseradish. Fruits Oranges, grapefruit, and lemon (fruit and juice). Meats High-fat meats, fish, and poultry. This includes hot dogs, ribs, ham, sausage, salami, and bacon. Dairy Whole milk and chocolate milk. Sour cream. Cream. Butter. Ice cream. Cream cheese.  Beverages Coffee and tea, with or without caffeine. Carbonated beverages or energy drinks. Condiments Hot sauce. Barbecue sauce.  Sweets/Desserts Chocolate and cocoa. Donuts. Peppermint and spearmint. Fats and Oils High-fat foods, including [place] fries and potato chips. Other Vinegar. Strong spices, such as black pepper, white pepper, red pepper, cayenne, curry powder, cloves, ginger, and chili powder. The items listed above may not be a complete list of foods and beverages to avoid. Contact your dietitian for more  information.   This information is not intended to replace advice given to you by your health care provider. Make sure you discuss any questions you have with your health care provider.   Document Released: 01/11/2005 Document Revised: 02/01/2014 Document Reviewed: 11/15/2012  Hiatal Hernia A hiatal hernia occurs when part of your stomach slides above the muscle that separates your abdomen from your chest (diaphragm). You can be born with a hiatal hernia (congenital), or it may develop over time. In almost all cases of hiatal hernia, only the top part of the stomach pushes through.  Many people have a hiatal hernia with no symptoms. The larger the hernia, the more likely that you will have symptoms. In some cases, a hiatal hernia allows stomach acid to flow back into the tube that carries food from your mouth to your stomach (esophagus). This may cause heartburn symptoms. Severe heartburn symptoms may mean you have developed a condition called gastroesophageal reflux disease (GERD).  CAUSES  Hiatal hernias are caused by a weakness in the opening (hiatus) where your esophagus passes through your diaphragm to attach to the upper part of your stomach. You may be born with a weakness in your hiatus, or a weakness can develop. RISK FACTORS Older age is a major risk factor for a hiatal hernia. Anything that increases pressure on your diaphragm can also increase your risk of a hiatal hernia. This includes:  Pregnancy.  Excess weight.  Frequent constipation. SIGNS AND SYMPTOMS  People with a hiatal hernia often have no symptoms. If symptoms develop, they are almost always caused by GERD. They may include:  Heartburn.  Belching.  Indigestion.  Trouble swallowing.  Coughing or wheezing.  Sore throat.  Hoarseness.  Chest pain.  DIAGNOSIS  A hiatal hernia is sometimes found during an exam for another problem. Your health care provider may suspect a hiatal hernia if you have symptoms of  GERD. Tests may be done to diagnose GERD. These may include:  X-rays of your stomach or chest.  An upper gastrointestinal (GI) series. This is an X-ray exam of your GI tract involving the use of a chalky liquid that you swallow. The liquid shows up clearly on the X-ray.  Endoscopy. This is a procedure to look into your stomach using a thin, flexible tube that has a tiny camera and light on the end of it. TREATMENT  If you have no symptoms, you may not need treatment. If you have symptoms, treatment may include:  Dietary and lifestyle changes to help reduce GERD symptoms.  Medicines. These may include:  Over-the-counter antacids.  Medicines that make your stomach empty more quickly.  Medicines that block the production of stomach acid (H2 blockers).  Stronger medicines to reduce stomach acid (proton pump inhibitors).  You may need surgery to repair the hernia if other treatments are not helping. HOME CARE INSTRUCTIONS   Take all medicines as directed by your health care provider.  Quit smoking, if you smoke.  Try to achieve and maintain a healthy body weight.  Eat frequent small meals instead of three large meals a day. This keeps your stomach from getting too full.  Eat slowly.  Do not lie down right after eating.  Do noteat 1-2 hours before bed.   Do not drink beverages with caffeine. These include cola, coffee, cocoa, and tea.  Do not drink alcohol.  Avoid foods that can make symptoms of GERD worse. These may include:  Fatty foods.  Citrus fruits.  Other foods and drinks that contain acid.  Avoid putting pressure on your belly. Anything that puts pressure on your belly increases the amount of acid that may be pushed up into your esophagus.   Avoid bending over, especially after eating.  Raise the head of your bed by putting blocks under the legs. This keeps your head and esophagus higher than your stomach.  Do not wear tight clothing around your chest or  stomach.  Try not to strain when having a bowel movement, when urinating, or when lifting heavy objects. SEEK MEDICAL CARE IF:  Your symptoms are not controlled with medicines or lifestyle changes.  You are having trouble swallowing.  You have coughing or wheezing that will not go away. SEEK IMMEDIATE MEDICAL CARE IF:  Your pain is getting worse.  Your pain spreads to your arms, neck, jaw, teeth, or back.  You have shortness of breath.  You sweat for no reason.  You feel sick to your stomach (nauseous) or vomit.  You vomit blood.  You have bright red blood in your stools.  You have black, tarry stools.    This information is not intended to replace advice given to you by your health care provider. Make sure you discuss any questions you have with your health care provider.   Document Released: 04/03/2003 Document Revised: 02/01/2014 Document Reviewed: 12/29/2012 Elsevier Interactive Patient Education 2016 [employer].  Chartered certified accountant Patient Education [employer].

## 2015-02-18 NOTE — Progress Notes (Signed)
MEDICARE ANNUAL WELLNESS VISIT AND ER FOLLOW UP  Assessment:   1. Essential hypertension - CBC with Differential - BASIC METABOLIC PANEL WITH GFR - Hepatic function panel - TSH - EKG 12-Lead - [place], RETROPERITNL ABD,  LTD  2. Prediabetes Discussed general issues about diabetes pathophysiology and management., Educational material distributed., Suggested low cholesterol diet., Encouraged aerobic exercise., Discussed foot care., Reminded to get yearly retinal exam. - Hemoglobin A1c - HM DIABETES FOOT EXAM  3. Depression/anxiety - continue medications, suggest getting counseling, names given, no suicidal/homicidal ideations.  4. Hyperlipidemia - Lipid panel - Urinalysis, Routine w reflex microscopic - Microalbumin / creatinine urine ratio  5. Vitamin D deficiency - Vit D  25 hydroxy (rtn osteoporosis monitoring)  6. Osteoporosis - DG Bone Density; Future- Can not tolerate fosamax  7. GERD with hiatal hernia Continue PPI/H2 blocker, diet discussed  8. Anemia-  Check iron, CBC   Future Appointments Date Time Provider Curtis  03/27/2015 10:30 AM Vicie Mutters, PA-C [facility] None  09/17/2015 2:00 PM Vicie Mutters, PA-C [facility] None     Plan:   During the course of the visit the patient was educated and counseled about appropriate screening and preventive services including:    Pneumococcal vaccine   Influenza vaccine  Td vaccine  Screening electrocardiogram  Screening mammography  Bone densitometry screening  Colorectal cancer screening  Diabetes screening  Glaucoma screening  Nutrition counseling   Advanced directives: given information/requested  Conditions/risks identified: BMI: Discussed weight loss, diet, and increase physical activity.  Increase physical activity: AHA recommends 150 minutes of physical activity a week.  Medications reviewed DEXA-  Due this year Urinary Incontinence is not an issue: discussed non pharmacology and  pharmacology options.  Fall risk: low- discussed PT, home fall assessment, medications.   Subjective:   Stephanie Roach is a 80 y.o. female who presents for Medicare Annual Wellness Visit and ER follow up. Date of last medicare wellness visit was 08/2014.   Patient went to the ER at [facility] on 1/21/20117 for chest pain, it was atypical, started while eating, troponin and EKG negative, normal CXR, given prevacid/tums and pain resolved. She is not on prevacid yet. She had GERD/cameron erosion in her stomach which lead to iron def anemia. Marland Kitchen She was on integra and following with Dr. Julien Nordmann but she has been released from his practice.   Her blood pressure has been controlled at home, today their BP is BP: 122/74 mmHg She does not workout, she did some walking while in [place] recently visiting her daughter in [place]. She denies chest pain, shortness of breath, dizziness.  She is on cholesterol medication and denies myalgias. Her cholesterol is at goal. The cholesterol last visit was:   Lab Results  Component Value Date   CHOL 229* 12/23/2014   HDL 79 12/23/2014   LDLCALC 125 12/23/2014   TRIG 124 12/23/2014   CHOLHDL 2.9 12/23/2014    She has been working on diet and exercise for prediabetes, and denies polydipsia, polyuria and visual disturbances. Last A1C in the office was:  Lab Results  Component Value Date   HGBA1C 5.8* 12/23/2014   Patient is on Vitamin D supplement.   Lab Results  Component Value Date   VD25OH 86 12/23/2014     Husband diagnosed with parkinson's and he is declining very quickly.   Names of Other Physician/Practitioners you currently use: 1. [facility]- here for primary care 2. Dr. Katy Fitch, eye doctor, last visit 3 months  3. Dr. Osa Craver, dentist, last visit 6 months Patient Care Team: Unk Pinto, MD as PCP - General (Internal Medicine) Lafayette Dragon, MD as Consulting Physician (Gastroenterology) Norma Fredrickson, MD as  Consulting Physician (Psychiatry) Maia Breslow, MD as Consulting Physician (Orthopedic Surgery) Curt Bears, MD as Consulting Physician (Oncology)   Medication Review   Medication List       This list is accurate as of: 02/18/15 10:00 AM.  Always use your most recent med list.               INTEGRA F 125-1 MG Caps  Take 1 capsule by mouth daily.     lansoprazole 15 MG capsule  Commonly known as:  PREVACID  Take by mouth.     LORazepam 1 MG tablet  Commonly known as:  ATIVAN  Take 1 mg by mouth every 8 (eight) hours.     omeprazole 40 MG capsule  Commonly known as:  PRILOSEC  Take 1 capsule (40 mg total) by mouth daily.     traZODone 50 MG tablet  Commonly known as:  DESYREL     venlafaxine XR 75 MG 24 hr capsule  Commonly known as:  EFFEXOR-XR  Take 75 mg by mouth daily.        Current Problems (verified) Patient Active Problem List   Diagnosis Date Noted  . Medication management 02/18/2015  . Hiatal hernia 02/18/2015  . Osteoporosis 09/17/2014  . Prediabetes   . Hyperlipidemia   . Hypertension   . Vitamin D deficiency   . Hx of adenomatous colonic polyps 08/11/2010  . Iron deficiency anemia 08/11/2010  . Chronic anxiety 08/11/2010    Screening Tests Health Maintenance  Topic Date Due  . PNA vac Low Risk Adult (2 of 2 - PPSV23) 11/08/2015 (Originally 08/28/2014)  . INFLUENZA VACCINE  11/12/2016 (Originally 08/26/2014)  . COLONOSCOPY  11/22/2018  . TETANUS/TDAP  08/06/2022  . DEXA SCAN  Completed  . ZOSTAVAX  Completed    Immunization History  Administered Date(s) Administered  . Pneumococcal Conjugate-13 08/27/2013  . Pneumococcal-Unspecified 12/06/2001  . Tdap 08/05/2012  . Zoster 02/05/2010    Preventative care: Last colonoscopy: 2015 EGD 2015 Last mammogram: 09/2014 Last pap smear/pelvic exam: 2012  DEXA: 09/2013 + osteoporosis, due in Aug  Prior vaccinations: TD or Tdap: 2014  Influenza: declines Prevnar 13: 2015 Pneumococcal:  2003, declines another Shingles/Zostavax: 2012  Allergies Allergies  Allergen Reactions  . Morphine And Related    Surgical history Past Surgical History  Procedure Laterality Date  . Cholecystectomy  2003  . Knee arthroscopy    . Bunionectomy    . Wrist fracture surgery    . Cataract extraction Bilateral    Family history Family History  Problem Relation Age of Onset  . Breast cancer Cousin   . Pancreatic cancer Paternal Aunt   . Ulcerative colitis Brother   . Colon polyps Maternal Aunt   . Colon cancer Father   . Stroke Mother     Risk Factors: Osteoporosis: postmenopausal estrogen deficiency and dietary calcium and/or vitamin D deficiency History of fracture in the past year: no  Tobacco Social History  Substance Use Topics  . Smoking status: Never Smoker   . Smokeless tobacco: Never Used  . Alcohol Use: No   She does not smoke.  Patient is not a former smoker. Are there smokers in your home (other than you)?  No  Alcohol Current alcohol use: none  Caffeine Current caffeine use: caffeinated soft drinks 1 /day  Exercise  Current exercise: none  Nutrition/Diet Current diet: in general, a "healthy" diet    Cardiac risk factors: advanced age (older than 37 for men, 43 for women), dyslipidemia, hypertension and sedentary lifestyle.  Depression Screen (Note: if answer to either of the following is "Yes", a more complete depression screening is indicated)   Q1: Over the past two weeks, have you felt down, depressed or hopeless? No  Q2: Over the past two weeks, have you felt little interest or pleasure in doing things? No  Have you lost interest or pleasure in daily life? No  Do you often feel hopeless? No  Do you cry easily over simple problems? No  Activities of Daily Living In your present state of health, do you have any difficulty performing the following activities?:  Driving? No Managing money?  No Feeding yourself? No Getting from bed to chair?  No Climbing a flight of stairs? No Preparing food and eating?: No Bathing or showering? No Getting dressed: No Getting to the toilet? No Using the toilet:No Moving around from place to place: No In the past year have you fallen or had a near fall?:No  Vision Difficulties: No  Hearing Difficulties: Yes Do you often ask people to speak up or repeat themselves? Yes Do you experience ringing or noises in your ears? No Do you have difficulty understanding soft or whispered voices? No  Cognition  Do you feel that you have a problem with memory?Yes  Do you often misplace items? No  Do you feel safe at home?  Yes  Advanced directives Does patient have a [place]? Yes Does patient have a Living Will? Yes   Objective:     Blood pressure 122/74, pulse 106, temperature 98.1 F (36.7 C), temperature source Temporal, resp. rate 16, height 5\' 4"  (1.626 m), weight 144 lb 6.4 oz (65.499 kg), SpO2 95 %. Body mass index is 24.77 kg/(m^2).  General appearance: alert, no distress, WD/WN,  female Cognitive Testing  Alert? Yes  Normal Appearance?Yes  Oriented to person? Yes  Place? Yes   Time? Yes  Recall of three objects?  Yes  Can perform simple calculations? Yes  Displays appropriate judgment?Yes  Can read the correct time from a watch face?Yes  HEENT: normocephalic, sclerae anicteric, TMs pearly, nares patent, no discharge or erythema, pharynx normal Oral cavity: MMM, no lesions Neck: supple, no lymphadenopathy, no thyromegaly, no masses Heart: RRR, normal S1, S2, no murmurs Lungs: CTA bilaterally, no wheezes, rhonchi, or rales Abdomen: +bs, soft, non tender, non distended, no masses, no hepatomegaly, no splenomegaly Musculoskeletal: nontender, no swelling, no obvious deformity Extremities: no edema, no cyanosis, no clubbing Pulses: 2+ symmetric, upper and lower extremities, normal cap refill Neurological: alert, oriented x 3, CN2-12 intact, strength normal  upper extremities and lower extremities, sensation normal throughout, DTRs 2+ throughout, no cerebellar signs, gait normal Psychiatric: normal affect, behavior normal, pleasant  Breast:  nontender, no masses or lumps, no skin changes, no nipple discharge or inversion, no axillary lymphadenopathy Gyn: defer  Rectal: defer  Medicare Attestation I have personally reviewed: The patient's medical and social history Their use of alcohol, tobacco or illicit drugs Their current medications and supplements The patient's functional ability including ADLs,fall risks, home safety risks, cognitive, and hearing and visual impairment Diet and physical activities Evidence for depression or mood disorders  The patient's weight, height, BMI, and visual acuity have been recorded in the chart.  I have made referrals, counseling, and provided education to  the patient based on review of the above and I have provided the patient with a written personalized care plan for preventive services.     Vicie Mutters, PA-C   02/18/2015

## 2015-03-27 ENCOUNTER — Ambulatory Visit (INDEPENDENT_AMBULATORY_CARE_PROVIDER_SITE_OTHER): Payer: Medicare Other | Admitting: Physician Assistant

## 2015-03-27 ENCOUNTER — Encounter: Payer: Self-pay | Admitting: Physician Assistant

## 2015-03-27 VITALS — BP 122/80 | HR 84 | Temp 97.7°F | Resp 16 | Ht 64.0 in | Wt 145.6 lb

## 2015-03-27 DIAGNOSIS — R7303 Prediabetes: Secondary | ICD-10-CM | POA: Diagnosis not present

## 2015-03-27 DIAGNOSIS — Z1389 Encounter for screening for other disorder: Secondary | ICD-10-CM | POA: Diagnosis not present

## 2015-03-27 DIAGNOSIS — E785 Hyperlipidemia, unspecified: Secondary | ICD-10-CM

## 2015-03-27 DIAGNOSIS — R5383 Other fatigue: Secondary | ICD-10-CM

## 2015-03-27 DIAGNOSIS — E559 Vitamin D deficiency, unspecified: Secondary | ICD-10-CM | POA: Diagnosis not present

## 2015-03-27 DIAGNOSIS — Z79899 Other long term (current) drug therapy: Secondary | ICD-10-CM | POA: Diagnosis not present

## 2015-03-27 DIAGNOSIS — I10 Essential (primary) hypertension: Secondary | ICD-10-CM | POA: Diagnosis not present

## 2015-03-27 DIAGNOSIS — Z1331 Encounter for screening for depression: Secondary | ICD-10-CM

## 2015-03-27 DIAGNOSIS — R7309 Other abnormal glucose: Secondary | ICD-10-CM | POA: Diagnosis not present

## 2015-03-27 DIAGNOSIS — D509 Iron deficiency anemia, unspecified: Secondary | ICD-10-CM

## 2015-03-27 LAB — CBC WITH DIFFERENTIAL/PLATELET
BASOS PCT: 0 % (ref 0–1)
Basophils Absolute: 0 10*3/uL (ref 0.0–0.1)
EOS ABS: 0 10*3/uL (ref 0.0–0.7)
EOS PCT: 0 % (ref 0–5)
HCT: 38.3 % (ref 36.0–46.0)
Hemoglobin: 12.1 g/dL (ref 12.0–15.0)
LYMPHS ABS: 1.4 10*3/uL (ref 0.7–4.0)
Lymphocytes Relative: 25 % (ref 12–46)
MCH: 24.3 pg — AB (ref 26.0–34.0)
MCHC: 31.6 g/dL (ref 30.0–36.0)
MCV: 76.9 fL — AB (ref 78.0–100.0)
MONOS PCT: 10 % (ref 3–12)
MPV: 10.1 fL (ref 8.6–12.4)
Monocytes Absolute: 0.6 10*3/uL (ref 0.1–1.0)
NEUTROS PCT: 65 % (ref 43–77)
Neutro Abs: 3.6 10*3/uL (ref 1.7–7.7)
PLATELETS: 393 10*3/uL (ref 150–400)
RBC: 4.98 MIL/uL (ref 3.87–5.11)
RDW: 15.5 % (ref 11.5–15.5)
WBC: 5.5 10*3/uL (ref 4.0–10.5)

## 2015-03-27 LAB — LIPID PANEL
Cholesterol: 273 mg/dL — ABNORMAL HIGH (ref 125–200)
HDL: 86 mg/dL (ref >=46)
LDL CALC: 159 mg/dL — AB (ref <130)
Total CHOL/HDL Ratio: 3.2 Ratio (ref <=5.0)
Triglycerides: 140 mg/dL (ref <150)
VLDL: 28 mg/dL (ref <30)

## 2015-03-27 LAB — BASIC METABOLIC PANEL WITH GFR
BUN: 17 mg/dL (ref 7–25)
CALCIUM: 9.6 mg/dL (ref 8.6–10.4)
CHLORIDE: 103 mmol/L (ref 98–110)
CO2: 24 mmol/L (ref 20–31)
CREATININE: 0.58 mg/dL — AB (ref 0.60–0.93)
GFR, Est Non African American: 89 mL/min (ref >=60)
Glucose, Bld: 105 mg/dL — ABNORMAL HIGH (ref 65–99)
Potassium: 4.4 mmol/L (ref 3.5–5.3)
SODIUM: 138 mmol/L (ref 135–146)

## 2015-03-27 LAB — IRON AND TIBC
%SAT: 14 % (ref 11–50)
Iron: 66 ug/dL (ref 45–160)
TIBC: 476 ug/dL — AB (ref 250–450)
UIBC: 410 ug/dL — ABNORMAL HIGH (ref 125–400)

## 2015-03-27 LAB — HEPATIC FUNCTION PANEL
ALT: 15 U/L (ref 6–29)
AST: 18 U/L (ref 10–35)
Albumin: 4.3 g/dL (ref 3.6–5.1)
Alkaline Phosphatase: 90 U/L (ref 33–130)
BILIRUBIN DIRECT: 0.1 mg/dL (ref <=0.2)
BILIRUBIN TOTAL: 0.4 mg/dL (ref 0.2–1.2)
Indirect Bilirubin: 0.3 mg/dL (ref 0.2–1.2)
TOTAL PROTEIN: 7.6 g/dL (ref 6.1–8.1)

## 2015-03-27 LAB — HEMOGLOBIN A1C
Hgb A1c MFr Bld: 5.9 % — ABNORMAL HIGH (ref <5.7)
MEAN PLASMA GLUCOSE: 123 mg/dL — AB (ref <117)

## 2015-03-27 LAB — FERRITIN: Ferritin: 9 ng/mL — ABNORMAL LOW (ref 20–288)

## 2015-03-27 LAB — TSH: TSH: 1.95 mIU/L

## 2015-03-27 LAB — MAGNESIUM: MAGNESIUM: 2 mg/dL (ref 1.5–2.5)

## 2015-03-27 MED ORDER — VENLAFAXINE HCL ER 75 MG PO CP24: 225.0000 mg | ORAL_CAPSULE | Freq: Every day | ORAL | Status: DC

## 2015-03-27 NOTE — Patient Instructions (Addendum)
Fatigue  Fatigue is feeling tired all of the time, a lack of energy, or a lack of motivation. Occasional or mild fatigue is often a normal response to activity or life in general. However, long-lasting (chronic) or extreme fatigue may indicate an underlying medical condition.  HOME CARE INSTRUCTIONS   Watch your fatigue for any changes. The following actions may help to lessen any discomfort you are feeling:  · Talk to your health care provider about how much sleep you need each night. Try to get the required amount every night.  · Take medicines only as directed by your health care provider.  · Eat a healthy and nutritious diet. Ask your health care provider if you need help changing your diet.  · Drink enough fluid to keep your urine clear or pale yellow.  · Practice ways of relaxing, such as yoga, meditation, massage therapy, or acupuncture.  · Exercise regularly.    · Change situations that cause you stress. Try to keep your work and personal routine reasonable.  · Do not abuse illegal drugs.  · Limit alcohol intake to no more than 1 drink per day for nonpregnant women and 2 drinks per day for men. One drink equals 12 ounces of beer, 5 ounces of wine, or 1½ ounces of hard liquor.  · Take a multivitamin, if directed by your health care provider.  SEEK MEDICAL CARE IF:   · Your fatigue does not get better.  · You have a fever.    · You have unintentional weight loss or gain.  · You have headaches.    · You have difficulty:      Falling asleep.    Sleeping throughout the night.  · You feel angry, guilty, anxious, or sad.     · You are unable to have a bowel movement (constipation).    · You skin is dry.     · Your legs or another part of your body is swollen.    SEEK IMMEDIATE MEDICAL CARE IF:   · You feel confused.    · Your vision is blurry.  · You feel faint or pass out.    · You have a severe headache.    · You have severe abdominal, pelvic, or back pain.    · You have chest pain, shortness of breath, or an  irregular or fast heartbeat.    · You are unable to urinate or you urinate less than normal.    · You develop abnormal bleeding, such as bleeding from the rectum, vagina, nose, lungs, or nipples.  · You vomit blood.     · You have thoughts about harming yourself or committing suicide.    · You are worried that you might harm someone else.       This information is not intended to replace advice given to you by your health care provider. Make sure you discuss any questions you have with your health care provider.     Document Released: 11/08/2006 Document Revised: 02/01/2014 Document Reviewed: 05/15/2013  Elsevier Interactive Patient Education ©2016 [employer]

## 2015-03-27 NOTE — Progress Notes (Signed)
Assessment and Plan:  1. Hypertension -Continue medication, monitor blood pressure at home. Continue DASH diet.  Reminder to go to the ER if any CP, SOB, nausea, dizziness, severe HA, changes vision/speech, left arm numbness and tingling and jaw pain.  2. Cholesterol -Continue diet and exercise. Check cholesterol.   3. Prediabetes  -Continue diet and exercise. Check A1C  4. Vitamin D Def - check level and continue medications.   5. Anemia - monitor, continue iron supp with Vitamin C and increase green leafy veggies  6. Depression - continue medications, stress management techniques discussed, increase water, good sleep hygiene discussed, increase exercise, and increase veggies.   7. Fatigue ? From anemia versus depression If anemia is back, add integra and follow up GI, and increase effexor to 3 pill a day  8 left knee pain Follow up ortho, has seen Dr. Maia Breslow in the past.   Continue diet and meds as discussed. Further disposition pending results of labs. Over 30 minutes of exam, counseling, chart review, and critical decision making was performed  HPI 80 y.o. female  presents for 3 month follow up on hypertension, cholesterol, prediabetes, and vitamin D deficiency.   Her blood pressure has been controlled at home, today their BP is BP: 122/80 mmHg  She does not workout. She denies chest pain, shortness of breath, dizziness.  She is on cholesterol medication and denies myalgias. Her cholesterol is not at goal. The cholesterol last visit was:   Lab Results  Component Value Date   CHOL 229* 12/23/2014   HDL 79 12/23/2014   LDLCALC 125 12/23/2014   TRIG 124 12/23/2014   CHOLHDL 2.9 12/23/2014    She has been working on diet and exercise for prediabetes, and denies paresthesia of the feet, polydipsia, polyuria and visual disturbances. Last A1C in the office was:  Lab Results  Component Value Date   HGBA1C 5.8* 12/23/2014   Patient is on Vitamin D supplement.   Lab  Results  Component Value Date   VD25OH 35 12/23/2014     She had GERD/cameron erosion in her stomach which lead to iron def anemia, was on integra and following with Dr. Julien Nordmann but has been released, went to the ER in Jan for atypical chest pain, she had normal EKG/troponin, has been on prvacid and states she has not had anymore CP.  Has not followed up with GI. Denies black stool.  Lab Results  Component Value Date   IRON 43* 12/23/2014   TIBC 468* 12/23/2014   FERRITIN 9* 12/23/2014   Lab Results  Component Value Date   WBC 4.7 12/23/2014   HGB 12.1 12/23/2014   HCT 38.3 12/23/2014   MCV 75.1* 12/23/2014   PLT 341 12/23/2014   Husband diagnosed with parkinson's and he is declining very quickly, she has some depression for this, has increased effexor to two a day for a long time, has some fatigue.   Current Medications:  Current Outpatient Prescriptions on File Prior to Visit  Medication Sig Dispense Refill  . Fe Fum-FePoly-FA-Vit C-Vit B3 (INTEGRA F) 125-1 MG CAPS Take 1 capsule by mouth daily. 30 capsule 3  . lansoprazole (PREVACID) 15 MG capsule Take by mouth.    Marland Kitchen LORazepam (ATIVAN) 1 MG tablet Take 1 mg by mouth every 8 (eight) hours.      . sucralfate (CARAFATE) 1 GM/10ML suspension Take 10 mLs (1 g total) by mouth 2 (two) times daily. 420 mL 1  . traZODone (DESYREL) 50 MG tablet     .  venlafaxine (EFFEXOR-XR) 75 MG 24 hr capsule Take 75 mg by mouth daily.       Current Facility-Administered Medications on File Prior to Visit  Medication Dose Route Frequency Provider Last Rate Last Dose  . 0.9 %  sodium chloride infusion  500 mL Intravenous Continuous Lafayette Dragon, MD       Medical History:  Past Medical History  Diagnosis Date  . Anemia   . Hyperlipidemia   . Arthritis   . Anxiety   . Depression   . Allergic rhinitis   . Sleep apnea   . Hypertension   . Prediabetes   . Migraine   . GERD (gastroesophageal reflux disease)   . Vitamin D deficiency     Allergies:  Allergies  Allergen Reactions  . Morphine And Related     Review of Systems:  Review of Systems  Constitutional: Positive for malaise/fatigue. Negative for fever, chills, weight loss and diaphoresis.  HENT: Negative.   Eyes: Negative.   Respiratory: Negative.   Cardiovascular: Negative.   Gastrointestinal: Negative.   Genitourinary: Negative.   Musculoskeletal: Positive for joint pain (left knee).  Skin: Negative.   Neurological: Negative.  Negative for weakness.  Psychiatric/Behavioral: Negative.     Family history- Review and unchanged Social history- Review and unchanged Physical Exam: BP 122/80 mmHg  Pulse 84  Temp(Src) 97.7 F (36.5 C) (Temporal)  Resp 16  Ht 5\' 4"  (1.626 m)  Wt 145 lb 9.6 oz (66.044 kg)  BMI 24.98 kg/m2  SpO2 98% Wt Readings from Last 3 Encounters:  03/27/15 145 lb 9.6 oz (66.044 kg)  02/18/15 144 lb 6.4 oz (65.499 kg)  12/23/14 144 lb (65.318 kg)   General Appearance: Well nourished, in no apparent distress. Eyes: PERRLA, EOMs, conjunctiva no swelling or erythema Sinuses: No Frontal/maxillary tenderness ENT/Mouth: Ext aud canals clear, TMs without erythema, bulging. No erythema, swelling, or exudate on post pharynx.  Tonsils not swollen or erythematous. Hearing normal.  Neck: Supple, thyroid normal.  Respiratory: Respiratory effort normal, BS equal bilaterally without rales, rhonchi, wheezing or stridor.  Cardio: RRR with no MRGs. Brisk peripheral pulses without edema.  Abdomen: Soft, + BS,  Non tender, no guarding, rebound, hernias, masses. Lymphatics: Non tender without lymphadenopathy.  Musculoskeletal: Full ROM, 5/5 strength, Normal gait Skin: Warm, dry without rashes, lesions, ecchymosis.  Neuro: Cranial nerves intact. Normal muscle tone, no cerebellar symptoms. Psych: Awake and oriented X 3, normal affect, Insight and Judgment appropriate.    Vicie Mutters, PA-C 10:45 AM [facility]

## 2015-03-28 LAB — VITAMIN D 25 HYDROXY (VIT D DEFICIENCY, FRACTURES): Vit D, 25-Hydroxy: 29 ng/mL — ABNORMAL LOW (ref 30–100)

## 2015-06-27 ENCOUNTER — Encounter: Payer: Self-pay | Admitting: Internal Medicine

## 2015-06-27 ENCOUNTER — Ambulatory Visit (INDEPENDENT_AMBULATORY_CARE_PROVIDER_SITE_OTHER): Payer: Medicare Other | Admitting: Physician Assistant

## 2015-06-27 VITALS — BP 108/76 | HR 84 | Temp 97.7°F | Resp 16 | Wt 145.0 lb

## 2015-06-27 DIAGNOSIS — F419 Anxiety disorder, unspecified: Secondary | ICD-10-CM | POA: Diagnosis not present

## 2015-06-27 DIAGNOSIS — I10 Essential (primary) hypertension: Secondary | ICD-10-CM | POA: Diagnosis not present

## 2015-06-27 DIAGNOSIS — E559 Vitamin D deficiency, unspecified: Secondary | ICD-10-CM

## 2015-06-27 DIAGNOSIS — E785 Hyperlipidemia, unspecified: Secondary | ICD-10-CM

## 2015-06-27 DIAGNOSIS — D509 Iron deficiency anemia, unspecified: Secondary | ICD-10-CM

## 2015-06-27 DIAGNOSIS — Z79899 Other long term (current) drug therapy: Secondary | ICD-10-CM

## 2015-06-27 DIAGNOSIS — R7303 Prediabetes: Secondary | ICD-10-CM

## 2015-06-27 DIAGNOSIS — R7309 Other abnormal glucose: Secondary | ICD-10-CM | POA: Diagnosis not present

## 2015-06-27 DIAGNOSIS — E538 Deficiency of other specified B group vitamins: Secondary | ICD-10-CM

## 2015-06-27 LAB — BASIC METABOLIC PANEL WITH GFR
BUN: 14 mg/dL (ref 7–25)
CALCIUM: 9 mg/dL (ref 8.6–10.4)
CO2: 23 mmol/L (ref 20–31)
Chloride: 105 mmol/L (ref 98–110)
Creat: 0.67 mg/dL (ref 0.60–0.93)
GFR, Est Non African American: 85 mL/min (ref >=60)
GLUCOSE: 112 mg/dL — AB (ref 65–99)
Potassium: 4.1 mmol/L (ref 3.5–5.3)
Sodium: 141 mmol/L (ref 135–146)

## 2015-06-27 LAB — IRON AND TIBC
%SAT: 8 % — ABNORMAL LOW (ref 11–50)
Iron: 38 ug/dL — ABNORMAL LOW (ref 45–160)
TIBC: 451 ug/dL — ABNORMAL HIGH (ref 250–450)
UIBC: 413 ug/dL — ABNORMAL HIGH (ref 125–400)

## 2015-06-27 LAB — CBC WITH DIFFERENTIAL/PLATELET
Basophils Absolute: 49 {cells}/uL (ref 0–200)
Basophils Relative: 1 %
Eosinophils Absolute: 0 {cells}/uL — ABNORMAL LOW (ref 15–500)
Eosinophils Relative: 0 %
HCT: 38.9 % (ref 35.0–45.0)
Hemoglobin: 12.6 g/dL (ref 11.7–15.5)
Lymphocytes Relative: 31 %
Lymphs Abs: 1519 {cells}/uL (ref 850–3900)
MCH: 25 pg — ABNORMAL LOW (ref 27.0–33.0)
MCHC: 32.4 g/dL (ref 32.0–36.0)
MCV: 77 fL — ABNORMAL LOW (ref 80.0–100.0)
MPV: 10 fL (ref 7.5–12.5)
Monocytes Absolute: 441 {cells}/uL (ref 200–950)
Monocytes Relative: 9 %
Neutro Abs: 2891 {cells}/uL (ref 1500–7800)
Neutrophils Relative %: 59 %
Platelets: 357 K/uL (ref 140–400)
RBC: 5.05 MIL/uL (ref 3.80–5.10)
RDW: 15.6 % — ABNORMAL HIGH (ref 11.0–15.0)
WBC: 4.9 K/uL (ref 3.8–10.8)

## 2015-06-27 LAB — TSH: TSH: 1.59 mIU/L

## 2015-06-27 LAB — HEPATIC FUNCTION PANEL
ALBUMIN: 4.3 g/dL (ref 3.6–5.1)
ALK PHOS: 91 U/L (ref 33–130)
ALT: 19 U/L (ref 6–29)
AST: 26 U/L (ref 10–35)
BILIRUBIN INDIRECT: 0.2 mg/dL (ref 0.2–1.2)
Bilirubin, Direct: 0.1 mg/dL (ref <=0.2)
TOTAL PROTEIN: 7 g/dL (ref 6.1–8.1)
Total Bilirubin: 0.3 mg/dL (ref 0.2–1.2)

## 2015-06-27 LAB — VITAMIN B12: Vitamin B-12: 361 pg/mL (ref 200–1100)

## 2015-06-27 LAB — LIPID PANEL
Cholesterol: 252 mg/dL — ABNORMAL HIGH (ref 125–200)
HDL: 83 mg/dL (ref >=46)
LDL CALC: 141 mg/dL — AB (ref <130)
Total CHOL/HDL Ratio: 3 Ratio (ref <=5.0)
Triglycerides: 141 mg/dL (ref <150)
VLDL: 28 mg/dL (ref <30)

## 2015-06-27 LAB — HEMOGLOBIN A1C
HEMOGLOBIN A1C: 5.7 % — AB (ref <5.7)
MEAN PLASMA GLUCOSE: 117 mg/dL

## 2015-06-27 LAB — FERRITIN: Ferritin: 9 ng/mL — ABNORMAL LOW (ref 20–288)

## 2015-06-27 LAB — MAGNESIUM: MAGNESIUM: 2 mg/dL (ref 1.5–2.5)

## 2015-06-27 NOTE — Progress Notes (Signed)
Assessment and Plan:  1. Hypertension -Continue medication, monitor blood pressure at home. Continue DASH diet.  Reminder to go to the ER if any CP, SOB, nausea, dizziness, severe HA, changes vision/speech, left arm numbness and tingling and jaw pain.  2. Cholesterol -Continue diet and exercise. Check cholesterol.   3. Prediabetes  -Continue diet and exercise. Check A1C  4. Vitamin D Def - check level and continue medications.   5. Anemia - monitor, continue iron supp with Vitamin C and increase green leafy veggies- Check B12/iron  6. Depression - continue medications, stress management techniques discussed, increase water, good sleep hygiene discussed, increase exercise, and increase veggies.  - continue effexor TID  Continue diet and meds as discussed. Further disposition pending results of labs. Over 30 minutes of exam, counseling, chart review, and critical decision making was performed  HPI 80 y.o. female  presents for 3 month follow up on hypertension, cholesterol, prediabetes, and vitamin D deficiency.   Her blood pressure has been controlled at home, today their BP is BP: 108/76 mmHg  She does not workout. She denies chest pain, shortness of breath, dizziness.  She is on cholesterol medication and denies myalgias. Her cholesterol is not at goal. The cholesterol last visit was:   Lab Results  Component Value Date   CHOL 273* 03/27/2015   HDL 86 03/27/2015   LDLCALC 159* 03/27/2015   TRIG 140 03/27/2015   CHOLHDL 3.2 03/27/2015    She has been working on diet and exercise for prediabetes, and denies paresthesia of the feet, polydipsia, polyuria and visual disturbances. Last A1C in the office was:  Lab Results  Component Value Date   HGBA1C 5.9* 03/27/2015   Patient is on Vitamin D supplement.   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 Julien Nordmann but has been released.  Has not followed up with GI. Denies black stool.  Lab Results  Component Value Date   IRON 66 03/27/2015   TIBC 476* 03/27/2015   FERRITIN 9* 03/27/2015   Lab Results  Component Value Date   WBC 5.5 03/27/2015   HGB 12.1 03/27/2015   HCT 38.3 03/27/2015   MCV 76.9* 03/27/2015   PLT 393 03/27/2015   Husband diagnosed with parkinson's and he is declining very quickly, she has increased to 3 effexor and states it is helping. Her daughter Lemmie Evens married an "illegal immigrant" and ICE picked him up. This is causing stress.   Current Medications:  Current Outpatient Prescriptions on File Prior to Visit  Medication Sig Dispense Refill  . Fe Fum-FePoly-FA-Vit C-Vit B3 (INTEGRA F) 125-1 MG CAPS Take 1 capsule by mouth daily. 30 capsule 3  . lansoprazole (PREVACID) 15 MG capsule Take by mouth.    Marland Kitchen LORazepam (ATIVAN) 1 MG tablet Take 1 mg by mouth every 8 (eight) hours.      . traZODone (DESYREL) 50 MG tablet     . venlafaxine XR (EFFEXOR-XR) 75 MG 24 hr capsule Take 3 capsules (225 mg total) by mouth daily. 90 capsule 3   Current Facility-Administered Medications on File Prior to Visit  Medication Dose Route Frequency Provider Last Rate Last Dose  . 0.9 %  sodium chloride infusion  500 mL Intravenous Continuous Lafayette Dragon, MD       Medical History:  Past Medical History  Diagnosis Date  .  Anemia   . Hyperlipidemia   . Arthritis   . Anxiety   . Depression   . Allergic rhinitis   . Sleep apnea   . Hypertension   . Prediabetes   . Migraine   . GERD (gastroesophageal reflux disease)   . Vitamin D deficiency    Allergies:  Allergies  Allergen Reactions  . Morphine And Related     Review of Systems:  Review of Systems  Constitutional: Positive for malaise/fatigue. Negative for fever, chills, weight loss and diaphoresis.  HENT: Negative.   Eyes: Negative.   Respiratory: Negative.   Cardiovascular: Negative.   Gastrointestinal: Negative.    Genitourinary: Negative.   Musculoskeletal: Positive for joint pain (left knee).  Skin: Negative.   Neurological: Negative.  Negative for weakness.  Psychiatric/Behavioral: Negative.     Family history- Review and unchanged Social history- Review and unchanged Physical Exam: BP 108/76 mmHg  Pulse 84  Temp(Src) 97.7 F (36.5 C)  Resp 16  Wt 145 lb (65.772 kg) Wt Readings from Last 3 Encounters:  06/27/15 145 lb (65.772 kg)  03/27/15 145 lb 9.6 oz (66.044 kg)  02/18/15 144 lb 6.4 oz (65.499 kg)   General Appearance: Well nourished, in no apparent distress. Eyes: PERRLA, EOMs, conjunctiva no swelling or erythema Sinuses: No Frontal/maxillary tenderness ENT/Mouth: Ext aud canals clear, TMs without erythema, bulging. No erythema, swelling, or exudate on post pharynx.  Tonsils not swollen or erythematous. Hearing normal.  Neck: Supple, thyroid normal.  Respiratory: Respiratory effort normal, BS equal bilaterally without rales, rhonchi, wheezing or stridor.  Cardio: RRR with no MRGs. Brisk peripheral pulses without edema.  Abdomen: Soft, + BS,  Non tender, no guarding, rebound, hernias, masses. Lymphatics: Non tender without lymphadenopathy.  Musculoskeletal: Full ROM, 5/5 strength, Normal gait Skin: Warm, dry without rashes, lesions, ecchymosis.  Neuro: Cranial nerves intact. Normal muscle tone, no cerebellar symptoms. Psych: Awake and oriented X 3, normal affect, Insight and Judgment appropriate.    Vicie Mutters, PA-C 12:09 PM [facility]

## 2015-06-27 NOTE — Patient Instructions (Signed)
Add on Bcomplex supplement Will help with energy, memory/concentration, decrease nerve pain, and help with weight loss. B12 is water soluble vitamin so you can not over dose on it, and anything you do not use will be sent out in your urine.   Iron Deficiency Anemia, Adult Anemia is a condition in which there are less red blood cells or hemoglobin in the blood than normal. Hemoglobin is the part of red blood cells that carries oxygen. Iron deficiency anemia is anemia caused by too little iron. It is the most common type of anemia. It may leave you tired and short of breath. CAUSES   Lack of iron in the diet.  Poor absorption of iron, as seen with intestinal disorders.  Intestinal bleeding.  Heavy periods. SIGNS AND SYMPTOMS  Mild anemia may not be noticeable. Symptoms may include:  Fatigue.  Headache.  Pale skin.  Weakness.  Tiredness.  Shortness of breath.  Dizziness.  Cold hands and feet.  Fast or irregular heartbeat. DIAGNOSIS  Diagnosis requires a thorough evaluation and physical exam by your health care provider. Blood tests are generally used to confirm iron deficiency anemia. Additional tests may be done to find the underlying cause of your anemia. These may include:  Testing for blood in the stool (fecal occult blood test).  A procedure to see inside the colon and rectum (colonoscopy).  A procedure to see inside the esophagus and stomach (endoscopy). TREATMENT  Iron deficiency anemia is treated by correcting the cause of the deficiency. Treatment may involve:  Adding iron-rich foods to your diet.  Taking iron supplements. Pregnant or breastfeeding women need to take extra iron because their normal diet usually does not provide the required amount.  Taking vitamins. Vitamin C improves the absorption of iron. Your health care provider may recommend that you take your iron tablets with a glass of orange juice or vitamin C supplement.  Medicines to make heavy  menstrual flow lighter.  Surgery. HOME CARE INSTRUCTIONS   Take iron as directed by your health care provider.  If you cannot tolerate taking iron supplements by mouth, talk to your health care provider about taking them through a vein (intravenously) or an injection into a muscle.  For the best iron absorption, iron supplements should be taken on an empty stomach. If you cannot tolerate them on an empty stomach, you may need to take them with food.  Do not drink milk or take antacids at the same time as your iron supplements. Milk and antacids may interfere with the absorption of iron.  Iron supplements can cause constipation. Make sure to include fiber in your diet to prevent constipation. A stool softener may also be recommended.  Take vitamins as directed by your health care provider.  Eat a diet rich in iron. Foods high in iron include liver, lean beef, whole-grain bread, eggs, dried fruit, and dark green leafy vegetables. SEEK IMMEDIATE MEDICAL CARE IF:   You faint. If this happens, do not drive. Call your local emergency services (911 in [place] if no other help is available.  You have chest pain.  You feel nauseous or vomit.  You have severe or increased shortness of breath with activity.  You feel weak.  You have a rapid heartbeat.  You have unexplained sweating.  You become light-headed when getting up from a chair or bed. MAKE SURE YOU:   Understand these instructions.  Will watch your condition.  Will get help right away if you are not doing well or  get worse.   This information is not intended to replace advice given to you by your health care provider. Make sure you discuss any questions you have with your health care provider.   Document Released: 01/09/2000 Document Revised: 02/01/2014 Document Reviewed: 09/18/2012 Elsevier Interactive Patient Education [employer].

## 2015-06-28 LAB — VITAMIN D 25 HYDROXY (VIT D DEFICIENCY, FRACTURES): VIT D 25 HYDROXY: 31 ng/mL (ref 30–100)

## 2015-07-01 ENCOUNTER — Other Ambulatory Visit: Payer: Self-pay | Admitting: *Deleted

## 2015-07-01 DIAGNOSIS — D509 Iron deficiency anemia, unspecified: Secondary | ICD-10-CM

## 2015-07-01 MED ORDER — INTEGRA F 125-1 MG PO CAPS: 1.0000 | ORAL_CAPSULE | Freq: Every day | ORAL | Status: DC

## 2015-07-22 ENCOUNTER — Other Ambulatory Visit: Payer: Self-pay | Admitting: Physician Assistant

## 2015-07-22 DIAGNOSIS — Z1231 Encounter for screening mammogram for malignant neoplasm of breast: Secondary | ICD-10-CM

## 2015-08-14 ENCOUNTER — Encounter: Payer: Self-pay | Admitting: Gastroenterology

## 2015-08-19 ENCOUNTER — Ambulatory Visit (INDEPENDENT_AMBULATORY_CARE_PROVIDER_SITE_OTHER): Payer: Medicare Other | Admitting: Physician Assistant

## 2015-08-19 ENCOUNTER — Encounter: Payer: Self-pay | Admitting: Physician Assistant

## 2015-08-19 VITALS — BP 118/60 | HR 102 | Temp 97.7°F | Resp 14 | Ht 65.0 in | Wt 149.8 lb

## 2015-08-19 DIAGNOSIS — R350 Frequency of micturition: Secondary | ICD-10-CM | POA: Diagnosis not present

## 2015-08-19 LAB — CBC WITH DIFFERENTIAL/PLATELET
BASOS ABS: 0 {cells}/uL (ref 0–200)
BASOS PCT: 0 %
EOS ABS: 0 {cells}/uL — AB (ref 15–500)
Eosinophils Relative: 0 %
HEMATOCRIT: 38.7 % (ref 35.0–45.0)
Hemoglobin: 12.7 g/dL (ref 11.7–15.5)
LYMPHS PCT: 28 %
Lymphs Abs: 1652 cells/uL (ref 850–3900)
MCH: 25.7 pg — ABNORMAL LOW (ref 27.0–33.0)
MCHC: 32.8 g/dL (ref 32.0–36.0)
MCV: 78.2 fL — AB (ref 80.0–100.0)
MONO ABS: 472 {cells}/uL (ref 200–950)
MPV: 10.2 fL (ref 7.5–12.5)
Monocytes Relative: 8 %
NEUTROS PCT: 64 %
Neutro Abs: 3776 cells/uL (ref 1500–7800)
Platelets: 381 10*3/uL (ref 140–400)
RBC: 4.95 MIL/uL (ref 3.80–5.10)
RDW: 15.6 % — AB (ref 11.0–15.0)
WBC: 5.9 10*3/uL (ref 3.8–10.8)

## 2015-08-19 MED ORDER — SULFAMETHOXAZOLE-TRIMETHOPRIM 800-160 MG PO TABS: 1.0000 | ORAL_TABLET | Freq: Two times a day (BID) | ORAL | 0 refills | Status: DC

## 2015-08-19 NOTE — Patient Instructions (Signed)

## 2015-08-19 NOTE — Progress Notes (Signed)
   Subjective:    Patient ID: Stephanie Roach, female    DOB: September 10, 1935, 80 y.o.   MRN: [id number]  Martin Majestic on recent vacation and state she was driving without stopping.    Urinary Tract Infection   This is a new problem. The current episode started in the past 7 days. The problem occurs every urination. The problem has been gradually improving. The quality of the pain is described as burning. There has been no fever. She is not sexually active. There is no history of pyelonephritis. Associated symptoms include flank pain (right side), frequency, hesitancy and urgency. Pertinent negatives include no chills, discharge, hematuria, nausea, possible pregnancy, sweats or vomiting. She has tried increased fluids for the symptoms. The treatment provided mild relief. Her past medical history is significant for recurrent UTIs (has been on preventative ABX in the past with urologist). There is no history of kidney stones.    Blood pressure 118/60, pulse (!) 102, temperature 97.7 F (36.5 C), temperature source Temporal, resp. rate 14, height 5\' 5"  (1.651 m), weight 149 lb 12.8 oz (67.9 kg), SpO2 96 %.  Medications Current Outpatient Prescriptions on File Prior to Visit  Medication Sig  . Fe Fum-FePoly-FA-Vit C-Vit B3 (INTEGRA F) 125-1 MG CAPS Take 1 capsule by mouth daily.  . lansoprazole (PREVACID) 15 MG capsule Take by mouth.  Marland Kitchen LORazepam (ATIVAN) 1 MG tablet Take 1 mg by mouth every 8 (eight) hours.    . traZODone (DESYREL) 50 MG tablet   . venlafaxine XR (EFFEXOR-XR) 75 MG 24 hr capsule Take 3 capsules (225 mg total) by mouth daily.   Current Facility-Administered Medications on File Prior to Visit  Medication  . 0.9 %  sodium chloride infusion    Problem list She has Hx of adenomatous colonic polyps; Iron deficiency anemia; Chronic anxiety; Prediabetes; Hyperlipidemia; Hypertension; Vitamin D deficiency; Osteoporosis; Medication management; Hiatal hernia; and Encounter for Medicare annual  wellness exam on her problem list.   Review of Systems  Constitutional: Negative for chills.  Gastrointestinal: Negative for nausea and vomiting.  Genitourinary: Positive for flank pain (right side), frequency, hesitancy and urgency. Negative for hematuria.       Objective:   Physical Exam  Constitutional: She is oriented to person, place, and time. She appears well-developed and well-nourished.  Neck: Normal range of motion. Neck supple.  Cardiovascular: Normal rate and regular rhythm.   Pulmonary/Chest: Effort normal and breath sounds normal.  Abdominal: Soft. Bowel sounds are normal. She exhibits no distension. There is tenderness (right CVA). There is no guarding.  Musculoskeletal: Normal range of motion. She exhibits no tenderness.  Neurological: She is alert and oriented to person, place, and time.  Skin: Skin is warm and dry.      Assessment & Plan:  1. Urinary frequency - Urine culture - Urinalysis with microscopic - sulfamethoxazole-trimethoprim (BACTRIM DS,SEPTRA DS) 800-160 MG tablet; Take 1 tablet by mouth 2 (two) times daily.  Dispense: 10 tablet; Refill: 0 - CBC with Differential/Platelet - BASIC METABOLIC PANEL WITH GFR

## 2015-08-20 LAB — BASIC METABOLIC PANEL WITH GFR
BUN: 11 mg/dL (ref 7–25)
CO2: 20 mmol/L (ref 20–31)
Calcium: 9.2 mg/dL (ref 8.6–10.4)
Chloride: 105 mmol/L (ref 98–110)
Creat: 0.74 mg/dL (ref 0.60–0.93)
GFR, Est Non African American: 78 mL/min (ref >=60)
GLUCOSE: 176 mg/dL — AB (ref 65–99)
POTASSIUM: 3.4 mmol/L — AB (ref 3.5–5.3)
Sodium: 140 mmol/L (ref 135–146)

## 2015-08-20 LAB — URINALYSIS, ROUTINE W REFLEX MICROSCOPIC
BILIRUBIN URINE: NEGATIVE
GLUCOSE, UA: NEGATIVE
KETONES UR: NEGATIVE
Leukocytes, UA: NEGATIVE
Nitrite: NEGATIVE
PH: 6 (ref 5.0–8.0)
Protein, ur: NEGATIVE
SPECIFIC GRAVITY, URINE: 1.007 (ref 1.001–1.035)

## 2015-08-20 LAB — URINALYSIS, MICROSCOPIC ONLY
BACTERIA UA: NONE SEEN [HPF]
Casts: NONE SEEN [LPF]
Crystals: NONE SEEN [HPF]
RBC / HPF: NONE SEEN RBC/HPF (ref <=2)
Squamous Epithelial / LPF: NONE SEEN [HPF] (ref <=5)
WBC UA: NONE SEEN WBC/HPF (ref <=5)
Yeast: NONE SEEN [HPF]

## 2015-08-20 LAB — URINE CULTURE: ORGANISM ID, BACTERIA: NO GROWTH

## 2015-09-17 ENCOUNTER — Encounter: Payer: Self-pay | Admitting: Physician Assistant

## 2015-10-22 ENCOUNTER — Ambulatory Visit
Admission: RE | Admit: 2015-10-22 | Discharge: 2015-10-22 | Disposition: A | Discharge status: Home / Self Care | Payer: Medicare Other | Source: Ambulatory Visit | Attending: Physician Assistant | Admitting: Physician Assistant

## 2015-10-22 DIAGNOSIS — M810 Age-related osteoporosis without current pathological fracture: Secondary | ICD-10-CM

## 2015-10-22 DIAGNOSIS — Z1231 Encounter for screening mammogram for malignant neoplasm of breast: Secondary | ICD-10-CM | POA: Diagnosis not present

## 2015-10-23 ENCOUNTER — Encounter: Payer: Self-pay | Admitting: Physician Assistant

## 2015-10-23 ENCOUNTER — Ambulatory Visit (INDEPENDENT_AMBULATORY_CARE_PROVIDER_SITE_OTHER): Payer: Medicare Other | Admitting: Physician Assistant

## 2015-10-23 VITALS — BP 138/88 | HR 98 | Temp 97.8°F | Resp 16 | Ht 64.75 in | Wt 144.0 lb

## 2015-10-23 DIAGNOSIS — Z136 Encounter for screening for cardiovascular disorders: Secondary | ICD-10-CM

## 2015-10-23 DIAGNOSIS — E559 Vitamin D deficiency, unspecified: Secondary | ICD-10-CM

## 2015-10-23 DIAGNOSIS — R7303 Prediabetes: Secondary | ICD-10-CM | POA: Diagnosis not present

## 2015-10-23 DIAGNOSIS — I10 Essential (primary) hypertension: Secondary | ICD-10-CM | POA: Diagnosis not present

## 2015-10-23 DIAGNOSIS — Z23 Encounter for immunization: Secondary | ICD-10-CM

## 2015-10-23 DIAGNOSIS — Z0001 Encounter for general adult medical examination with abnormal findings: Secondary | ICD-10-CM | POA: Diagnosis not present

## 2015-10-23 DIAGNOSIS — D509 Iron deficiency anemia, unspecified: Secondary | ICD-10-CM | POA: Diagnosis not present

## 2015-10-23 DIAGNOSIS — Z86010 Personal history of colonic polyps: Secondary | ICD-10-CM

## 2015-10-23 DIAGNOSIS — E785 Hyperlipidemia, unspecified: Secondary | ICD-10-CM | POA: Diagnosis not present

## 2015-10-23 DIAGNOSIS — K449 Diaphragmatic hernia without obstruction or gangrene: Secondary | ICD-10-CM

## 2015-10-23 DIAGNOSIS — F419 Anxiety disorder, unspecified: Secondary | ICD-10-CM

## 2015-10-23 DIAGNOSIS — M810 Age-related osteoporosis without current pathological fracture: Secondary | ICD-10-CM

## 2015-10-23 DIAGNOSIS — Z79899 Other long term (current) drug therapy: Secondary | ICD-10-CM

## 2015-10-23 DIAGNOSIS — R6889 Other general symptoms and signs: Secondary | ICD-10-CM

## 2015-10-23 LAB — CBC WITH DIFFERENTIAL/PLATELET
BASOS ABS: 0 {cells}/uL (ref 0–200)
Basophils Relative: 0 %
EOS ABS: 0 {cells}/uL — AB (ref 15–500)
Eosinophils Relative: 0 %
HEMATOCRIT: 39.6 % (ref 35.0–45.0)
HEMOGLOBIN: 13.4 g/dL (ref 11.7–15.5)
LYMPHS ABS: 1344 {cells}/uL (ref 850–3900)
Lymphocytes Relative: 24 %
MCH: 26.3 pg — AB (ref 27.0–33.0)
MCHC: 33.8 g/dL (ref 32.0–36.0)
MCV: 77.6 fL — ABNORMAL LOW (ref 80.0–100.0)
MONO ABS: 504 {cells}/uL (ref 200–950)
MPV: 10 fL (ref 7.5–12.5)
Monocytes Relative: 9 %
NEUTROS ABS: 3752 {cells}/uL (ref 1500–7800)
NEUTROS PCT: 67 %
Platelets: 367 10*3/uL (ref 140–400)
RBC: 5.1 MIL/uL (ref 3.80–5.10)
RDW: 15.1 % — ABNORMAL HIGH (ref 11.0–15.0)
WBC: 5.6 10*3/uL (ref 3.8–10.8)

## 2015-10-23 LAB — BASIC METABOLIC PANEL WITH GFR
BUN: 9 mg/dL (ref 7–25)
CALCIUM: 9.3 mg/dL (ref 8.6–10.4)
CO2: 25 mmol/L (ref 20–31)
Chloride: 105 mmol/L (ref 98–110)
Creat: 0.67 mg/dL (ref 0.60–0.93)
GFR, EST NON AFRICAN AMERICAN: 85 mL/min (ref >=60)
GFR, Est African American: >89 mL/min (ref >=60)
GLUCOSE: 107 mg/dL — AB (ref 65–99)
Potassium: 3.8 mmol/L (ref 3.5–5.3)
Sodium: 143 mmol/L (ref 135–146)

## 2015-10-23 LAB — IRON AND TIBC
%SAT: 15 % (ref 11–50)
Iron: 58 ug/dL (ref 45–160)
TIBC: 398 ug/dL (ref 250–450)
UIBC: 340 ug/dL (ref 125–400)

## 2015-10-23 LAB — HEPATIC FUNCTION PANEL
ALT: 13 U/L (ref 6–29)
AST: 16 U/L (ref 10–35)
Albumin: 4.2 g/dL (ref 3.6–5.1)
Alkaline Phosphatase: 85 U/L (ref 33–130)
BILIRUBIN INDIRECT: 0.2 mg/dL (ref 0.2–1.2)
Bilirubin, Direct: 0.1 mg/dL (ref <=0.2)
TOTAL PROTEIN: 7.1 g/dL (ref 6.1–8.1)
Total Bilirubin: 0.3 mg/dL (ref 0.2–1.2)

## 2015-10-23 LAB — LIPID PANEL
CHOLESTEROL: 249 mg/dL — AB (ref 125–200)
HDL: 62 mg/dL (ref >=46)
LDL CALC: 147 mg/dL — AB (ref <130)
Total CHOL/HDL Ratio: 4 Ratio (ref <=5.0)
Triglycerides: 198 mg/dL — ABNORMAL HIGH (ref <150)
VLDL: 40 mg/dL — AB (ref <30)

## 2015-10-23 LAB — MAGNESIUM: MAGNESIUM: 2 mg/dL (ref 1.5–2.5)

## 2015-10-23 LAB — TSH: TSH: 1.99 mIU/L

## 2015-10-23 LAB — FERRITIN: Ferritin: 17 ng/mL — ABNORMAL LOW (ref 20–288)

## 2015-10-23 MED ORDER — ALENDRONATE SODIUM 70 MG PO TABS: 70.0000 mg | ORAL_TABLET | ORAL | 3 refills | Status: DC

## 2015-10-23 NOTE — Progress Notes (Signed)
CPE AND 3 MONTH FOLLOW  Assessment:   Essential hypertension - CBC with Differential - BASIC METABOLIC PANEL WITH GFR - Hepatic function panel - TSH - EKG 12-Lead   Prediabetes Discussed general issues about diabetes pathophysiology and management., Educational material distributed., Suggested low cholesterol diet., Encouraged aerobic exercise., Discussed foot care., Reminded to get yearly retinal exam. - Hemoglobin A1c - HM DIABETES FOOT EXAM  Chronic anxiety - continue medications, suggest getting counseling, names given, no suicidal/homicidal ideations.  Hyperlipidemia - Lipid panel - Urinalysis, Routine w reflex microscopic - Microalbumin / creatinine urine ratio  Vitamin D deficiency - Vit D  25 hydroxy (rtn osteoporosis monitoring)  Osteoporosis - DG Bone Density; Future- Willing to try foxamax once weekly, stop if any CP  GERD Continue PPI/H2 blocker, diet discussed  Anemia-  Check iron, CBC    Subjective:   Shelene I Diss is a 80 y.o. female who presents for complete physical and follow up for chol, preDM, and HTN.    Her blood pressure has been controlled at home, today their BP is BP: 138/88 She does not workout.  She denies chest pain, shortness of breath, dizziness.  She is on cholesterol medication and denies myalgias. Her cholesterol is at goal. The cholesterol last visit was:   Lab Results  Component Value Date   CHOL 252 (H) 06/27/2015   HDL 83 06/27/2015   LDLCALC 141 (H) 06/27/2015   TRIG 141 06/27/2015   CHOLHDL 3.0 06/27/2015    She has been working on diet and exercise for prediabetes, and denies polydipsia, polyuria and visual disturbances. Last A1C in the office was:  Lab Results  Component Value Date   HGBA1C 5.7 (H) 06/27/2015   Patient is on Vitamin D supplement, 2000 IU.   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 She was on integra and following with Dr. Julien Nordmann but she has been released from his practice.  Husband, Juanda Crumble diagnosed with parkinson's and he is declining very quickly.   Names of Other Physician/Practitioners you currently use: 1. [facility]- here for primary care 2. Dr. Katy Fitch, eye doctor, last visit 3 months 3. Dr. Osa Craver, dentist, last visit 6 months Patient Care Team: Unk Pinto, MD as PCP - General (Internal Medicine) Lafayette Dragon, MD (Inactive) as Consulting Physician (Gastroenterology) Norma Fredrickson, MD as Consulting Physician (Psychiatry) Maia Breslow, MD as Consulting Physician (Orthopedic Surgery) Curt Bears, MD as Consulting Physician (Oncology)   Medication Review Current Outpatient Prescriptions on File Prior to Visit  Medication Sig Dispense Refill  . Fe Fum-FePoly-FA-Vit C-Vit B3 (INTEGRA F) 125-1 MG CAPS Take 1 capsule by mouth daily. 30 capsule 3  . lansoprazole (PREVACID) 15 MG capsule Take by mouth.    Marland Kitchen LORazepam (ATIVAN) 1 MG tablet Take 1 mg by mouth every 8 (eight) hours.      . traZODone (DESYREL) 50 MG tablet     . venlafaxine XR (EFFEXOR-XR) 75 MG 24 hr capsule Take 3 capsules (225 mg total) by mouth daily. 90 capsule 3   Current Facility-Administered Medications on File Prior to Visit  Medication Dose Route Frequency Provider Last Rate Last Dose  . 0.9 %  sodium chloride infusion  500 mL Intravenous Continuous Lafayette Dragon, MD        Current Problems (verified)  Patient Active Problem List   Diagnosis Date Noted  . Medication management 02/18/2015  . Hiatal hernia 02/18/2015  . Encounter for Medicare annual wellness exam 02/18/2015  . Osteoporosis 09/17/2014  . Prediabetes   . Hyperlipidemia   . Hypertension   . Vitamin D deficiency   . Hx of adenomatous colonic polyps 08/11/2010  . Iron deficiency anemia 08/11/2010  . Chronic anxiety 08/11/2010     Screening Tests Immunization History  Administered Date(s) Administered  . Pneumococcal Conjugate-13 08/27/2013  . Pneumococcal-Unspecified 12/06/2001  . Tdap 08/05/2012  . Zoster 02/05/2010    Preventative care: Last colonoscopy: 2015 EGD 2015 Last mammogram: 09/2015 Last pap smear/pelvic exam: 2012  DEXA: 09/2015 + osteoporosis  Prior vaccinations: TD or Tdap: 2014  Influenza: declines Prevnar 13: 2015 Pneumococcal: 2003 DUE Shingles/Zostavax: 2012  Allergies Allergies  Allergen Reactions  . Morphine And Related     SURGICAL HISTORY She  has a past surgical history that includes Cholecystectomy (2003); Knee arthroscopy; Bunionectomy; Wrist fracture surgery; and Cataract extraction (Bilateral). FAMILY HISTORY Her family history includes Breast cancer in her cousin; Colon cancer in her father; Colon polyps in her maternal aunt; Pancreatic cancer in her paternal aunt; Stroke in her mother; Ulcerative colitis in her brother. SOCIAL HISTORY She  reports that she has never smoked. She has never used smokeless tobacco. She reports that she does not drink alcohol or use drugs.  Review of Systems  Constitutional: Positive for malaise/fatigue. Negative for chills, diaphoresis, fever and weight loss.  HENT: Negative.   Eyes: Negative.   Respiratory: Negative.   Cardiovascular: Negative.   Gastrointestinal: Negative.   Genitourinary: Negative.   Musculoskeletal: Negative.   Skin: Negative.   Neurological: Negative.  Negative for weakness.  Psychiatric/Behavioral: Negative.      Objective:     Blood pressure 138/88, pulse 98, temperature 97.8 F (36.6 C), temperature source Temporal, resp. rate 16, height 5' 4.75" (1.645 m), weight 144 lb (65.3 kg). Body mass index is 24.15 kg/m.  General appearance: alert, no distress, WD/WN,  female HEENT: normocephalic, sclerae anicteric, TMs pearly, nares patent, no discharge or erythema, pharynx normal Oral cavity: MMM, no  lesions Neck: supple, no lymphadenopathy, no thyromegaly, no masses Heart: RRR, normal S1, S2, no murmurs Lungs: CTA bilaterally, no wheezes, rhonchi, or rales Abdomen: +bs, soft, non tender, non distended, no masses, no hepatomegaly, no splenomegaly Musculoskeletal: nontender, no swelling, no obvious deformity Extremities: no edema, no cyanosis, no clubbing Pulses: 2+ symmetric, upper and lower extremities, normal cap refill Neurological: alert, oriented x 3, CN2-12 intact, strength normal upper extremities and lower extremities, sensation normal throughout, DTRs 2+ throughout, no cerebellar signs, gait normal Psychiatric: normal affect, behavior normal, pleasant  Breast:  Defers, wants in 6 month OV since just had MGM Gyn: defer  Rectal: defer    Vicie Mutters, PA-C   10/23/2015

## 2015-10-23 NOTE — Patient Instructions (Signed)
Suggest getting on fosamax for weak bones.  Increase vitamin d to close to 5000 IU daily, so take 2 or 3 of the 2000 IU daily.    Osteoporosis Osteoporosis is the thinning and loss of density in the bones. Osteoporosis makes the bones more brittle, fragile, and likely to break (fracture). Over time, osteoporosis can cause the bones to become so weak that they fracture after a simple fall. The bones most likely to fracture are the bones in the hip, wrist, and spine. CAUSES  The exact cause is not known. RISK FACTORS Anyone can develop osteoporosis. You may be at greater risk if you have a family history of the condition or have poor nutrition. You may also have a higher risk if you are:   Female.   56 years old or older.  A smoker.  Not physically active.   White or Asian.  Slender. SIGNS AND SYMPTOMS  A fracture might be the first sign of the disease, especially if it results from a fall or injury that would not usually cause a bone to break. Other signs and symptoms include:   Low back and neck pain.  Stooped posture.  Height loss. DIAGNOSIS  To make a diagnosis, your health care provider may:  Take a medical history.  Perform a physical exam.  Order tests, such as:  A bone mineral density test.  A dual-energy X-ray absorptiometry test. TREATMENT  The goal of osteoporosis treatment is to strengthen your bones to reduce your risk of a fracture. Treatment may involve:  Making lifestyle changes, such as:  Eating a diet rich in calcium.  Doing weight-bearing and muscle-strengthening exercises.  Stopping tobacco use.  Limiting alcohol intake.  Taking medicine to slow the process of bone loss or to increase bone density.  Monitoring your levels of calcium and vitamin D. HOME CARE INSTRUCTIONS  Include calcium and vitamin D in your diet. Calcium is important for bone health, and vitamin D helps the body absorb calcium.  Perform weight-bearing and  muscle-strengthening exercises as directed by your health care provider.  Do not use any tobacco products, including cigarettes, chewing tobacco, and electronic cigarettes. If you need help quitting, ask your health care provider.  Limit your alcohol intake.  Take medicines only as directed by your health care provider.  Keep all follow-up visits as directed by your health care provider. This is important.  Take precautions at home to lower your risk of falling, such as:  Keeping rooms well lit and clutter free.  Installing safety rails on stairs.  Using rubber mats in the bathroom and other areas that are often wet or slippery. SEEK IMMEDIATE MEDICAL CARE IF:  You fall or injure yourself.    This information is not intended to replace advice given to you by your health care provider. Make sure you discuss any questions you have with your health care provider.   Document Released: 10/21/2004 Document Revised: 02/01/2014 Document Reviewed: 06/21/2013 Elsevier Interactive Patient Education [employer].

## 2015-10-24 ENCOUNTER — Other Ambulatory Visit: Payer: Self-pay | Admitting: Physician Assistant

## 2015-10-24 DIAGNOSIS — R928 Other abnormal and inconclusive findings on diagnostic imaging of breast: Secondary | ICD-10-CM

## 2015-10-24 LAB — MICROALBUMIN / CREATININE URINE RATIO
Creatinine, Urine: 250 mg/dL (ref 20–320)
Microalb Creat Ratio: 8 mcg/mg creat (ref <30)
Microalb, Ur: 2 mg/dL

## 2015-10-24 LAB — URINALYSIS, ROUTINE W REFLEX MICROSCOPIC
BILIRUBIN URINE: NEGATIVE
GLUCOSE, UA: NEGATIVE
HGB URINE DIPSTICK: NEGATIVE
KETONES UR: NEGATIVE
LEUKOCYTES UA: NEGATIVE
Nitrite: NEGATIVE
PROTEIN: NEGATIVE
Specific Gravity, Urine: 1.023 (ref 1.001–1.035)
pH: 5.5 (ref 5.0–8.0)

## 2015-10-24 LAB — HEMOGLOBIN A1C
Hgb A1c MFr Bld: 5.8 % — ABNORMAL HIGH (ref <5.7)
MEAN PLASMA GLUCOSE: 120 mg/dL

## 2015-10-27 ENCOUNTER — Ambulatory Visit
Admission: RE | Admit: 2015-10-27 | Discharge: 2015-10-27 | Disposition: A | Discharge status: Home / Self Care | Payer: Medicare Other | Source: Ambulatory Visit | Attending: Physician Assistant | Admitting: Physician Assistant

## 2015-10-27 DIAGNOSIS — R928 Other abnormal and inconclusive findings on diagnostic imaging of breast: Secondary | ICD-10-CM | POA: Diagnosis not present

## 2015-10-27 DIAGNOSIS — N6489 Other specified disorders of breast: Secondary | ICD-10-CM | POA: Diagnosis not present

## 2016-02-06 ENCOUNTER — Ambulatory Visit: Payer: Self-pay | Admitting: Physician Assistant

## 2016-02-09 ENCOUNTER — Ambulatory Visit (INDEPENDENT_AMBULATORY_CARE_PROVIDER_SITE_OTHER): Payer: Medicare Other | Admitting: Physician Assistant

## 2016-02-09 ENCOUNTER — Encounter: Payer: Self-pay | Admitting: Physician Assistant

## 2016-02-09 VITALS — BP 116/80 | HR 93 | Temp 97.5°F | Resp 16 | Ht 64.75 in | Wt 146.8 lb

## 2016-02-09 DIAGNOSIS — K449 Diaphragmatic hernia without obstruction or gangrene: Secondary | ICD-10-CM

## 2016-02-09 DIAGNOSIS — Z0001 Encounter for general adult medical examination with abnormal findings: Secondary | ICD-10-CM | POA: Diagnosis not present

## 2016-02-09 DIAGNOSIS — E559 Vitamin D deficiency, unspecified: Secondary | ICD-10-CM

## 2016-02-09 DIAGNOSIS — M810 Age-related osteoporosis without current pathological fracture: Secondary | ICD-10-CM | POA: Diagnosis not present

## 2016-02-09 DIAGNOSIS — Z86010 Personal history of colonic polyps: Secondary | ICD-10-CM

## 2016-02-09 DIAGNOSIS — Z79899 Other long term (current) drug therapy: Secondary | ICD-10-CM

## 2016-02-09 DIAGNOSIS — R6889 Other general symptoms and signs: Secondary | ICD-10-CM

## 2016-02-09 DIAGNOSIS — E785 Hyperlipidemia, unspecified: Secondary | ICD-10-CM

## 2016-02-09 DIAGNOSIS — I10 Essential (primary) hypertension: Secondary | ICD-10-CM | POA: Diagnosis not present

## 2016-02-09 DIAGNOSIS — Z0000 Encounter for general adult medical examination without abnormal findings: Secondary | ICD-10-CM

## 2016-02-09 DIAGNOSIS — F419 Anxiety disorder, unspecified: Secondary | ICD-10-CM | POA: Diagnosis not present

## 2016-02-09 DIAGNOSIS — R7303 Prediabetes: Secondary | ICD-10-CM | POA: Diagnosis not present

## 2016-02-09 DIAGNOSIS — D509 Iron deficiency anemia, unspecified: Secondary | ICD-10-CM

## 2016-02-09 LAB — CBC WITH DIFFERENTIAL/PLATELET
Basophils Absolute: 58 cells/uL (ref 0–200)
Basophils Relative: 1 %
Eosinophils Absolute: 0 cells/uL — ABNORMAL LOW (ref 15–500)
Eosinophils Relative: 0 %
HEMATOCRIT: 35.7 % (ref 35.0–45.0)
Hemoglobin: 10.9 g/dL — ABNORMAL LOW (ref 11.7–15.5)
LYMPHS PCT: 34 %
Lymphs Abs: 1972 cells/uL (ref 850–3900)
MCH: 22.8 pg — ABNORMAL LOW (ref 27.0–33.0)
MCHC: 30.5 g/dL — AB (ref 32.0–36.0)
MCV: 74.7 fL — AB (ref 80.0–100.0)
MONO ABS: 580 {cells}/uL (ref 200–950)
MONOS PCT: 10 %
MPV: 10 fL (ref 7.5–12.5)
NEUTROS PCT: 55 %
Neutro Abs: 3190 cells/uL (ref 1500–7800)
PLATELETS: 388 10*3/uL (ref 140–400)
RBC: 4.78 MIL/uL (ref 3.80–5.10)
RDW: 15.9 % — AB (ref 11.0–15.0)
WBC: 5.8 10*3/uL (ref 3.8–10.8)

## 2016-02-09 LAB — BASIC METABOLIC PANEL WITH GFR
BUN: 16 mg/dL (ref 7–25)
CALCIUM: 9.5 mg/dL (ref 8.6–10.4)
CO2: 27 mmol/L (ref 20–31)
Chloride: 103 mmol/L (ref 98–110)
Creat: 0.69 mg/dL (ref 0.60–0.93)
GFR, EST NON AFRICAN AMERICAN: 84 mL/min (ref >=60)
GLUCOSE: 92 mg/dL (ref 65–99)
Potassium: 4.2 mmol/L (ref 3.5–5.3)
Sodium: 140 mmol/L (ref 135–146)

## 2016-02-09 LAB — TSH: TSH: 1.9 mIU/L

## 2016-02-09 LAB — IRON AND TIBC
%SAT: 10 % — AB (ref 11–50)
IRON: 47 ug/dL (ref 45–160)
TIBC: 471 ug/dL — ABNORMAL HIGH (ref 250–450)
UIBC: 424 ug/dL — AB (ref 125–400)

## 2016-02-09 LAB — HEPATIC FUNCTION PANEL
ALT: 12 U/L (ref 6–29)
AST: 15 U/L (ref 10–35)
Albumin: 4.2 g/dL (ref 3.6–5.1)
Alkaline Phosphatase: 80 U/L (ref 33–130)
BILIRUBIN DIRECT: 0 mg/dL (ref <=0.2)
BILIRUBIN TOTAL: 0.3 mg/dL (ref 0.2–1.2)
Indirect Bilirubin: 0.3 mg/dL (ref 0.2–1.2)
Total Protein: 6.9 g/dL (ref 6.1–8.1)

## 2016-02-09 LAB — FERRITIN: FERRITIN: 10 ng/mL — AB (ref 20–288)

## 2016-02-09 LAB — LIPID PANEL
CHOL/HDL RATIO: 3 ratio (ref <5.0)
CHOLESTEROL: 219 mg/dL — AB (ref <200)
HDL: 72 mg/dL (ref >50)
LDL Cholesterol: 113 mg/dL — ABNORMAL HIGH (ref <100)
TRIGLYCERIDES: 172 mg/dL — AB (ref <150)
VLDL: 34 mg/dL — ABNORMAL HIGH (ref <30)

## 2016-02-09 LAB — HEMOGLOBIN A1C
Hgb A1c MFr Bld: 5.4 % (ref <5.7)
Mean Plasma Glucose: 108 mg/dL

## 2016-02-09 NOTE — Patient Instructions (Addendum)
Please pick one of the over the counter allergy medications below and take it once daily for allergies.  Claritin or loratadine cheapest but likely the weakest  Zyrtec or certizine at night because it can make you sleepy The strongest is allegra or fexafinadine  Cheapest at [employer], [employer], [employer]  HOW TO TREAT VIRAL COUGH AND COLD SYMPTOMS:  -Symptoms usually last at least 1 week with the worst symptoms being around day 4.  - colds usually start with a sore throat and end with a cough, and the cough can take 2 weeks to get better.  -No antibiotics are needed for colds, flu, sore throats, cough, bronchitis UNLESS symptoms are longer than 7 days OR if you are getting better then get drastically worse.  -There are a lot of combination medications (Dayquil, Nyquil, Vicks 44, tyelnol cold and sinus, ETC). Please look at the ingredients on the back so that you are treating the correct symptoms and not doubling up on medications/ingredients.    Medicines you can use  Nasal congestion  - pseudoephedrine (Sudafed)- behind the counter, do not use if you have high blood pressure, medicine that have -D in them.  - phenylephrine (Sudafed PE) -Dextormethorphan + chlorpheniramine (Coridcidin HBP)- okay if you have high blood pressure -Oxymetazoline (Afrin) nasal spray- LIMIT to 3 days -Saline nasal spray -Neti pot (used distilled or bottled water)  Ear pain/congestion  -pseudoephedrine (sudafed) - Nasonex/flonase nasal spray  Fever  -Acetaminophen (Tyelnol) -Ibuprofen (Advil, motrin, aleve)  Sore Throat  -Acetaminophen (Tyelnol) -Ibuprofen (Advil, motrin, aleve) -Drink a lot of water -Gargle with salt water - Rest your voice (don't talk) -Throat sprays -Cough drops  Body Aches  -Acetaminophen (Tyelnol) -Ibuprofen (Advil, motrin, aleve)  Headache  -Acetaminophen (Tyelnol) -Ibuprofen (Advil, motrin, aleve) - Exedrin, Exedrin Migraine  Allergy symptoms (cough, sneeze, runny nose, itchy  eyes) -Claritin or loratadine cheapest but likely the weakest  -Zyrtec or certizine at night because it can make you sleepy -The strongest is allegra or fexafinadine  Cheapest at [employer], [employer], [employer]  Cough  -Dextromethorphan (Delsym)- medicine that has DM in it -Guafenesin (Mucinex/Robitussin) - cough drops - drink lots of water  Chest Congestion  -Guafenesin (Mucinex/Robitussin)  Red Itchy Eyes  - Naphcon-A  Upset Stomach  - Bland diet (nothing spicy, greasy, fried, and high acid foods like tomatoes, oranges, berries) -OKAY- cereal, bread, soup, crackers, rice -Eat smaller more frequent meals -reduce caffeine, no alcohol -Loperamide (Imodium-AD) if diarrhea -Prevacid for heart burn  General health when sick  -Hydration -wash your hands frequently -keep surfaces clean -change pillow cases and sheets often -Get fresh air but do not exercise strenuously -Vitamin D, double up on it - Vitamin C -Zinc        Fatigue Introduction Fatigue is feeling tired all of the time, a lack of energy, or a lack of motivation. Occasional or mild fatigue is often a normal response to activity or life in general. However, long-lasting (chronic) or extreme fatigue may indicate an underlying medical condition. Follow these instructions at home: Watch your fatigue for any changes. The following actions may help to lessen any discomfort you are feeling:  Talk to your health care provider about how much sleep you need each night. Try to get the required amount every night.  Take medicines only as directed by your health care provider.  Eat a healthy and nutritious diet. Ask your health care provider if you need help changing your diet.  Drink enough fluid to keep your urine clear  or pale yellow.  Practice ways of relaxing, such as yoga, meditation, massage therapy, or acupuncture.  Exercise regularly.  Change situations that cause you stress. Try to keep your work and personal  routine reasonable.  Do not abuse illegal drugs.  Limit alcohol intake to no more than 1 drink per day for nonpregnant women and 2 drinks per day for men. One drink equals 12 ounces of beer, 5 ounces of wine, or 1 ounces of hard liquor.  Take a multivitamin, if directed by your health care provider. Contact a health care provider if:  Your fatigue does not get better.  You have a fever.  You have unintentional weight loss or gain.  You have headaches.  You have difficulty:  Falling asleep.  Sleeping throughout the night.  You feel angry, guilty, anxious, or sad.  You are unable to have a bowel movement (constipation).  You skin is dry.  Your legs or another part of your body is swollen. Get help right away if:  You feel confused.  Your vision is blurry.  You feel faint or pass out.  You have a severe headache.  You have severe abdominal, pelvic, or back pain.  You have chest pain, shortness of breath, or an irregular or fast heartbeat.  You are unable to urinate or you urinate less than normal.  You develop abnormal bleeding, such as bleeding from the rectum, vagina, nose, lungs, or nipples.  You vomit blood.  You have thoughts about harming yourself or committing suicide.  You are worried that you might harm someone else. This information is not intended to replace advice given to you by your health care provider. Make sure you discuss any questions you have with your health care provider. Document Released: 11/08/2006 Document Revised: 06/19/2015 Document Reviewed: 05/15/2013  2017 Elsevier

## 2016-02-09 NOTE — Progress Notes (Signed)
Assessment and Plan:   Hypertension -Continue medication, monitor blood pressure at home. Continue DASH diet.  Reminder to go to the ER if any CP, SOB, nausea, dizziness, severe HA, changes vision/speech, left arm numbness and tingling and jaw pain.  Cholesterol -Continue diet and exercise. Check cholesterol.   Prediabetes  -Continue diet and exercise. Check A1C   Vitamin D Def - check level and continue medications.   Anemia - monitor, continue iron supp with Vitamin C and increase green leafy veggies- Check B12/iron  Depression - continue medications, stress management techniques discussed, increase water, good sleep hygiene discussed, increase exercise, and increase veggies.  - continue effexor TID - declines meds at this time, will send in zoloft if patient calls.   Iron deficiency anemia, unspecified iron deficiency anemia type -     Iron and TIBC -     Ferritin  Osteoporosis, unspecified osteoporosis type, unspecified pathological fracture presence  get dexa, continue Vit D and Ca, weight bearing exercises  Hx of adenomatous colonic polyps Up to date  Encounter for Medicare annual wellness exam  Chronic anxiety Continue effexor    Continue diet and meds as discussed. Further disposition pending results of labs. Over 30 minutes of exam, counseling, chart review, and critical decision making was performed  During the course of the visit the patient was educated and counseled about appropriate screening and preventive services including:    Pneumococcal vaccine   Influenza vaccine  Td vaccine  Screening electrocardiogram  Screening mammography  Bone densitometry screening  Colorectal cancer screening  Diabetes screening  Glaucoma screening  Nutrition counseling   Advanced directives: given information/requested   HPI 81 y.o. female  presents for 3 month follow up on hypertension, cholesterol, prediabetes, and vitamin D deficiency and wellness.    Her blood pressure has been controlled at home, today their BP is BP: 116/80  She does not workout due to knee pain. She denies chest pain, shortness of breath, dizziness.  She is on cholesterol medication and denies myalgias. Her cholesterol is not at goal. The cholesterol last visit was:   Lab Results  Component Value Date   CHOL 249 (H) 10/23/2015   HDL 62 10/23/2015   LDLCALC 147 (H) 10/23/2015   TRIG 198 (H) 10/23/2015   CHOLHDL 4.0 10/23/2015    She has been working on diet and exercise for prediabetes, and denies paresthesia of the feet, polydipsia, polyuria and visual disturbances. Last A1C in the office was:  Lab Results  Component Value Date   HGBA1C 5.8 (H) 10/23/2015   Patient is on Vitamin D supplement.   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 Julien Nordmann but has been released.  Has not followed up with GI. Denies black stool.  Lab Results  Component Value Date   IRON 58 10/23/2015   TIBC 398 10/23/2015   FERRITIN 17 (L) 10/23/2015   Lab Results  Component Value Date   WBC 5.6 10/23/2015   HGB 13.4 10/23/2015   HCT 39.6 10/23/2015   MCV 77.6 (L) 10/23/2015   PLT 367 10/23/2015   Husband diagnosed with parkinson's and he is declining very quickly, she has increased to 3 effexor and states it is helping. Her daughter Lemmie Evens married an "illegal immigrant" and ICE picked him up. This is causing stress.  BMI is Body mass index is  24.62 kg/m., she is working on diet and exercise. Wt Readings from Last 3 Encounters:  02/09/16 146 lb 12.8 oz (66.6 kg)  10/23/15 144 lb (65.3 kg)  08/19/15 149 lb 12.8 oz (67.9 kg)     Current Medications:  Current Outpatient Prescriptions on File Prior to Visit  Medication Sig Dispense Refill  . alendronate (FOSAMAX) 70 MG tablet Take 1 tablet (70 mg total) by mouth once a week. 4 tablet 3  . Fe  Fum-FePoly-FA-Vit C-Vit B3 (INTEGRA F) 125-1 MG CAPS Take 1 capsule by mouth daily. 30 capsule 3  . lansoprazole (PREVACID) 15 MG capsule Take by mouth.    Marland Kitchen LORazepam (ATIVAN) 1 MG tablet Take 1 mg by mouth every 8 (eight) hours.      . traZODone (DESYREL) 50 MG tablet     . venlafaxine XR (EFFEXOR-XR) 75 MG 24 hr capsule Take 3 capsules (225 mg total) by mouth daily. 90 capsule 3   Current Facility-Administered Medications on File Prior to Visit  Medication Dose Route Frequency Provider Last Rate Last Dose  . 0.9 %  sodium chloride infusion  500 mL Intravenous Continuous [place] Dragon, MD       Medical History:  Past Medical History:  Diagnosis Date  . Allergic rhinitis   . Anemia   . Anxiety   . Arthritis   . Depression   . GERD (gastroesophageal reflux disease)   . Hyperlipidemia   . Hypertension   . Migraine   . Prediabetes   . Sleep apnea   . Vitamin D deficiency    Names of Other Physician/Practitioners you currently use: 1. [facility]- here for primary care 2. Dr. Katy Fitch, eye doctor, last visit 3 months 3. Dr. Osa Craver, dentist, last visit 6 months 2017 Patient Care Team: Unk Pinto, MD as PCP - General (Internal Medicine) [place] Dragon, MD as Consulting Physician (Gastroenterology) Norma Fredrickson, MD as Consulting Physician (Psychiatry) Maia Breslow, MD as Consulting Physician (Orthopedic Surgery) Curt Bears, MD as Consulting Physician (Oncology)  Preventative care: Immunization History  Administered Date(s) Administered  . Pneumococcal Conjugate-13 08/27/2013  . Pneumococcal Polysaccharide-23 10/23/2015  . Pneumococcal-Unspecified 12/06/2001  . Tdap 08/05/2012  . Zoster 02/05/2010   Last colonoscopy: 2015 EGD 2015 Last mammogram: 10/2015 Last pap smear/pelvic exam: 2012  DEXA: 09/2015 + osteoporosis  Prior vaccinations: TD or Tdap: 2014         Influenza: declines Prevnar 13: 2015 Pneumococcal:  20017 Shingles/Zostavax: 2012  Allergies Allergies  Allergen Reactions  . Morphine And Related     SURGICAL HISTORY She  has a past surgical history that includes Cholecystectomy (2003); Knee arthroscopy; Bunionectomy; Wrist fracture surgery; and Cataract extraction (Bilateral). FAMILY HISTORY Her family history includes Breast cancer in her cousin; Colon cancer in her father; Colon polyps in her maternal aunt; Pancreatic cancer in her paternal aunt; Stroke in her mother; Ulcerative colitis in her brother. SOCIAL HISTORY She  reports that she has never smoked. She has never used smokeless tobacco. She reports that she does not drink alcohol or use drugs.  MEDICARE WELLNESS OBJECTIVES: Physical activity: Current Exercise Habits: The patient does not participate in regular exercise at present, Exercise limited by: orthopedic condition(s) Cardiac risk factors: Cardiac Risk Factors include: advanced age (>58men, >51 women);dyslipidemia;hypertension;sedentary lifestyle Depression/mood screen:   Depression screen [facility] 2/9 02/09/2016  Decreased Interest 0  Down, Depressed, Hopeless 0  PHQ - 2 Score 0  Altered sleeping -  Tired, decreased energy -  Change  in appetite -  Feeling bad or failure about yourself  -  Trouble concentrating -  Moving slowly or fidgety/restless -  Suicidal thoughts -  PHQ-9 Score -  Difficult doing work/chores -    ADLs:  In your present state of health, do you have any difficulty performing the following activities: 02/09/2016 02/18/2015  Hearing? N Y  Vision? N N  Difficulty concentrating or making decisions? N Y  Walking or climbing stairs? N N  Dressing or bathing? N N  Doing errands, shopping? N N  Preparing Food and eating ? - N  Using the Toilet? - N  In the past six months, have you accidently leaked urine? - N  Do you have problems with loss of bowel control? - N  Managing your Medications? - N  Managing your Finances? - N  Housekeeping or  managing your Housekeeping? - N  Some recent data might be hidden    Cognitive Testing  Alert? Yes  Normal Appearance?Yes  Oriented to person? Yes  Place? Yes   Time? Yes  Recall of three objects?  Yes  Can perform simple calculations? Yes  Displays appropriate judgment?Yes  Can read the correct time from a watch face?Yes  EOL planning: Does Patient Have a Medical Advance Directive?: Yes Type of Advance Directive: Healthcare Power of Attorney, Living will [place] in Chart?: No - copy requested   Review of Systems:  Review of Systems  Constitutional: Positive for malaise/fatigue. Negative for chills, diaphoresis, fever and weight loss.  HENT: Negative.   Eyes: Negative.   Respiratory: Negative.   Cardiovascular: Negative.   Gastrointestinal: Negative.   Genitourinary: Negative.   Musculoskeletal: Positive for joint pain (left knee).  Skin: Negative.   Neurological: Negative.  Negative for weakness.  Psychiatric/Behavioral: Negative.    General Appearance: Well nourished, in no apparent distress. Eyes: PERRLA, EOMs, conjunctiva no swelling or erythema Sinuses: No Frontal/maxillary tenderness ENT/Mouth: Ext aud canals clear, TMs without erythema, bulging. No erythema, swelling, or exudate on post pharynx.  Tonsils not swollen or erythematous. Hearing normal.  Neck: Supple, thyroid normal.  Respiratory: Respiratory effort normal, BS equal bilaterally without rales, rhonchi, wheezing or stridor.  Cardio: RRR with no MRGs. Brisk peripheral pulses without edema.  Abdomen: Soft, + BS,  Non tender, no guarding, rebound, hernias, masses. Lymphatics: Non tender without lymphadenopathy.  Musculoskeletal: Full ROM, 5/5 strength, Normal gait Skin: Warm, dry without rashes, lesions, ecchymosis.  Neuro: Cranial nerves intact. Normal muscle tone, no cerebellar symptoms. Psych: Awake and oriented X 3, normal affect, Insight and Judgment appropriate.     Medicare Attestation I have personally reviewed: The patient's medical and social history Their use of alcohol, tobacco or illicit drugs Their current medications and supplements The patient's functional ability including ADLs,fall risks, home safety risks, cognitive, and hearing and visual impairment Diet and physical activities Evidence for depression or mood disorders  The patient's weight, height, BMI, and visual acuity have been recorded in the chart.  I have made referrals, counseling, and provided education to the patient based on review of the above and I have provided the patient with a written personalized care plan for preventive services.     Vicie Mutters, PA-C                         02/09/16  [facility]

## 2016-02-10 ENCOUNTER — Other Ambulatory Visit: Payer: Self-pay | Admitting: Physician Assistant

## 2016-02-10 DIAGNOSIS — D508 Other iron deficiency anemias: Secondary | ICD-10-CM

## 2016-02-10 LAB — VITAMIN D 25 HYDROXY (VIT D DEFICIENCY, FRACTURES): Vit D, 25-Hydroxy: 30 ng/mL (ref 30–100)

## 2016-02-10 LAB — MAGNESIUM: Magnesium: 2 mg/dL (ref 1.5–2.5)

## 2016-02-10 MED ORDER — INTEGRA F 125-1 MG PO CAPS: 1.0000 | ORAL_CAPSULE | Freq: Every day | ORAL | 3 refills | Status: DC

## 2016-02-10 NOTE — Progress Notes (Signed)
Future Appointments Date Time Provider [place]  05/11/2016 11:30 AM Unk Pinto, MD [facility] None  10/28/2016 10:00 AM Vicie Mutters, PA-C [facility] None

## 2016-02-10 NOTE — Progress Notes (Signed)
Pt aware of lab results & voiced understanding of those results.

## 2016-04-14 DIAGNOSIS — D2271 Melanocytic nevi of right lower limb, including hip: Secondary | ICD-10-CM | POA: Diagnosis not present

## 2016-04-14 DIAGNOSIS — D225 Melanocytic nevi of trunk: Secondary | ICD-10-CM | POA: Diagnosis not present

## 2016-04-14 DIAGNOSIS — D2272 Melanocytic nevi of left lower limb, including hip: Secondary | ICD-10-CM | POA: Diagnosis not present

## 2016-04-14 DIAGNOSIS — D2262 Melanocytic nevi of left upper limb, including shoulder: Secondary | ICD-10-CM | POA: Diagnosis not present

## 2016-04-14 DIAGNOSIS — D2261 Melanocytic nevi of right upper limb, including shoulder: Secondary | ICD-10-CM | POA: Diagnosis not present

## 2016-05-11 ENCOUNTER — Ambulatory Visit (INDEPENDENT_AMBULATORY_CARE_PROVIDER_SITE_OTHER): Payer: Medicare Other | Admitting: Internal Medicine

## 2016-05-11 ENCOUNTER — Encounter: Payer: Self-pay | Admitting: Internal Medicine

## 2016-05-11 VITALS — BP 106/72 | HR 64 | Temp 97.2°F | Resp 16 | Ht 64.75 in | Wt 145.0 lb

## 2016-05-11 DIAGNOSIS — I10 Essential (primary) hypertension: Secondary | ICD-10-CM | POA: Diagnosis not present

## 2016-05-11 DIAGNOSIS — E782 Mixed hyperlipidemia: Secondary | ICD-10-CM | POA: Diagnosis not present

## 2016-05-11 DIAGNOSIS — E559 Vitamin D deficiency, unspecified: Secondary | ICD-10-CM | POA: Diagnosis not present

## 2016-05-11 DIAGNOSIS — Z79899 Other long term (current) drug therapy: Secondary | ICD-10-CM

## 2016-05-11 DIAGNOSIS — R7303 Prediabetes: Secondary | ICD-10-CM | POA: Diagnosis not present

## 2016-05-11 LAB — LIPID PANEL
Cholesterol: 259 mg/dL — ABNORMAL HIGH (ref <200)
HDL: 82 mg/dL (ref >50)
LDL Cholesterol: 144 mg/dL — ABNORMAL HIGH (ref <100)
Total CHOL/HDL Ratio: 3.2 Ratio (ref <5.0)
Triglycerides: 165 mg/dL — ABNORMAL HIGH (ref <150)
VLDL: 33 mg/dL — ABNORMAL HIGH (ref <30)

## 2016-05-11 LAB — HEPATIC FUNCTION PANEL
ALBUMIN: 4.3 g/dL (ref 3.6–5.1)
ALT: 10 U/L (ref 6–29)
AST: 15 U/L (ref 10–35)
Alkaline Phosphatase: 92 U/L (ref 33–130)
Bilirubin, Direct: 0.1 mg/dL (ref <=0.2)
Indirect Bilirubin: 0.3 mg/dL (ref 0.2–1.2)
Total Bilirubin: 0.4 mg/dL (ref 0.2–1.2)
Total Protein: 7.5 g/dL (ref 6.1–8.1)

## 2016-05-11 LAB — CBC WITH DIFFERENTIAL/PLATELET
BASOS ABS: 55 {cells}/uL (ref 0–200)
Basophils Relative: 1 %
Eosinophils Absolute: 0 cells/uL — ABNORMAL LOW (ref 15–500)
Eosinophils Relative: 0 %
HEMATOCRIT: 39.1 % (ref 35.0–45.0)
HEMOGLOBIN: 12.3 g/dL (ref 11.7–15.5)
Lymphocytes Relative: 30 %
Lymphs Abs: 1650 cells/uL (ref 850–3900)
MCH: 23.5 pg — AB (ref 27.0–33.0)
MCHC: 31.5 g/dL — AB (ref 32.0–36.0)
MCV: 74.8 fL — AB (ref 80.0–100.0)
MONO ABS: 605 {cells}/uL (ref 200–950)
MPV: 9.9 fL (ref 7.5–12.5)
Monocytes Relative: 11 %
NEUTROS ABS: 3190 {cells}/uL (ref 1500–7800)
NEUTROS PCT: 58 %
Platelets: 346 10*3/uL (ref 140–400)
RBC: 5.23 MIL/uL — ABNORMAL HIGH (ref 3.80–5.10)
RDW: 18.3 % — ABNORMAL HIGH (ref 11.0–15.0)
WBC: 5.5 10*3/uL (ref 3.8–10.8)

## 2016-05-11 LAB — BASIC METABOLIC PANEL WITH GFR
BUN: 16 mg/dL (ref 7–25)
CALCIUM: 9.6 mg/dL (ref 8.6–10.4)
CHLORIDE: 101 mmol/L (ref 98–110)
CO2: 23 mmol/L (ref 20–31)
CREATININE: 0.8 mg/dL (ref 0.60–0.93)
GFR, Est African American: 82 mL/min (ref >=60)
GFR, Est Non African American: 71 mL/min (ref >=60)
GLUCOSE: 103 mg/dL — AB (ref 65–99)
Potassium: 4.1 mmol/L (ref 3.5–5.3)
Sodium: 137 mmol/L (ref 135–146)

## 2016-05-11 LAB — TSH: TSH: 2.48 m[IU]/L

## 2016-05-11 NOTE — Patient Instructions (Signed)

## 2016-05-11 NOTE — Progress Notes (Signed)
This very nice 81 y.o. MWF presents for 3 month follow up with labile Hypertension, Hyperlipidemia, Pre-Diabetes and Vitamin D Deficiency.      Patient has prior hx/o elevated BP 140/102 in the past and has since been followed expectantly for labile HTN & BP has been controlled at home. Today's BP is at goal - 106/72. Patient has had no complaints of any cardiac type chest pain, palpitations, dyspnea/orthopnea/PND, dizziness, claudication, or dependent edema.     Hyperlipidemia is controlled with diet & meds. Patient denies myalgias or other med SE's. Last Lipids were not at goal: Lab Results  Component Value Date   CHOL 219 (H) 02/09/2016   HDL 72 02/09/2016   LDLCALC 113 (H) 02/09/2016   TRIG 172 (H) 02/09/2016   CHOLHDL 3.0 02/09/2016      Also, the patient has history of PreDiabetes (A1c 6.2% in 2013)  and has had no symptoms of reactive hypoglycemia, diabetic polys, paresthesias or visual blurring.  Last A1c was at goal:  Lab Results  Component Value Date   HGBA1C 5.4 02/09/2016      Further, the patient also has history of Vitamin D Deficiency ("33" in 2013)  and does not supplement Vitamin D as repeatedly advised. Last vitamin D was still very low  (goal 70-100): Lab Results  Component Value Date   VD25OH 30 02/09/2016   Current Outpatient Prescriptions on File Prior to Visit  Medication Sig  . Fe Fum-FePoly-FA-Vit C-Vit B3 (INTEGRA F) 125-1 MG CAPS Take 1 capsule by mouth daily.  Marland Kitchen LORazepam (ATIVAN) 1 MG tablet Take 1 mg by mouth every 8 (eight) hours.    . traZODone (DESYREL) 50 MG tablet   . alendronate (FOSAMAX) 70 MG tablet Take 1 tablet (70 mg total) by mouth once a week. (Patient not taking due to c/o Heartburn: Reported on 05/11/2016)   Allergies  Allergen Reactions  . Morphine And Related    PMHx:   Past Medical History:  Diagnosis Date  . Allergic rhinitis   . Anemia   . Anxiety   . Arthritis   . Depression   . GERD (gastroesophageal reflux disease)     . Hyperlipidemia   . Hypertension   . Migraine   . Prediabetes   . Sleep apnea   . Vitamin D deficiency    Immunization History  Administered Date(s) Administered  . Pneumococcal Conjugate-13 08/27/2013  . Pneumococcal Polysaccharide-23 10/23/2015  . Pneumococcal-Unspecified 12/06/2001  . Tdap 08/05/2012  . Zoster 02/05/2010   Past Surgical History:  Procedure Laterality Date  . BUNIONECTOMY    . CATARACT EXTRACTION Bilateral   . CHOLECYSTECTOMY  2003  . KNEE ARTHROSCOPY    . WRIST FRACTURE SURGERY     FHx:    Reviewed / unchanged  SHx:    Reviewed / unchanged  Systems Review:  Constitutional: Denies fever, chills, wt changes, headaches, insomnia, fatigue, night sweats, change in appetite. Eyes: Denies redness, blurred vision, diplopia, discharge, itchy, watery eyes.  ENT: Denies discharge, congestion, post nasal drip, epistaxis, sore throat, earache, hearing loss, dental pain, tinnitus, vertigo, sinus pain, snoring.  CV: Denies chest pain, palpitations, irregular heartbeat, syncope, dyspnea, diaphoresis, orthopnea, PND, claudication or edema. Respiratory: denies cough, dyspnea, DOE, pleurisy, hoarseness, laryngitis, wheezing.  Gastrointestinal: Denies dysphagia, odynophagia, heartburn, reflux, water brash, abdominal pain or cramps, nausea, vomiting, bloating, diarrhea, constipation, hematemesis, melena, hematochezia  or hemorrhoids. Genitourinary: Denies dysuria, frequency, urgency, nocturia, hesitancy, discharge, hematuria or flank pain. Musculoskeletal: Denies arthralgias, myalgias,  stiffness, jt. swelling, pain, limping or strain/sprain.  Skin: Denies pruritus, rash, hives, warts, acne, eczema or change in skin lesion(s). Neuro: No weakness, tremor, incoordination, spasms, paresthesia or pain. Psychiatric: Denies confusion, memory loss or sensory loss. Endo: Denies change in weight, skin or hair change.  Heme/Lymph: No excessive bleeding, bruising or enlarged lymph  nodes.  Physical Exam  BP 106/72   Pulse 64   Temp 97.2 F (36.2 C)   Resp 16   Ht 5' 4.75" (1.645 m)   Wt 145 lb (65.8 kg)   BMI 24.32 kg/m   Appears well nourished, well groomed  and in no distress.  Eyes: PERRLA, EOMs, conjunctiva no swelling or erythema. Sinuses: No frontal/maxillary tenderness ENT/Mouth: EAC's clear, TM's nl w/o erythema, bulging. Nares clear w/o erythema, swelling, exudates. Oropharynx clear without erythema or exudates. Oral hygiene is good. Tongue normal, non obstructing. Hearing intact.  Neck: Supple. Thyroid nl. Car 2+/2+ without bruits, nodes or JVD. Chest: Respirations nl with BS clear & equal w/o rales, rhonchi, wheezing or stridor.  Cor: Heart sounds normal w/ regular rate and rhythm without sig. murmurs, gallops, clicks or rubs. Peripheral pulses normal and equal  without edema.  Abdomen: Soft & bowel sounds normal. Non-tender w/o guarding, rebound, hernias, masses or organomegaly.  Lymphatics: Unremarkable.  Musculoskeletal: Full ROM all peripheral extremities, joint stability, 5/5 strength and normal gait.  Skin: Warm, dry without exposed rashes, lesions or ecchymosis apparent.  Neuro: Cranial nerves intact, reflexes equal bilaterally. Sensory-motor testing grossly intact. Tendon reflexes grossly intact.  Pysch: Alert & oriented x 3.  Insight and judgement nl & appropriate. No ideations.  Assessment and Plan:  1. Essential hypertension  - Continue medication, monitor blood pressure at home.  - Continue DASH diet. Reminder to go to the ER if any CP,  SOB, nausea, dizziness, severe HA, changes vision/speech,  left arm numbness and tingling and jaw pain. - CBC with Differential/Platelet - BASIC METABOLIC PANEL WITH GFR - Magnesium - TSH  2. Mixed hyperlipidemia  - Continue diet/meds, exercise,& lifestyle modifications.  - Continue monitor periodic cholesterol/liver & renal functions   - Hepatic function panel - Lipid panel - TSH  3.  Prediabetes  - Continue diet, exercise, lifestyle modifications.  - Monitor appropriate labs.  - Hemoglobin A1c - Insulin, random  4. Vitamin D deficiency  - Continue supplementation.  - VITAMIN D 25 Hydroxy   5. Medication management  - CBC with Differential/Platelet - BASIC METABOLIC PANEL WITH GFR - Hepatic function panel - Magnesium - Lipid panel - TSH - Hemoglobin A1c - Insulin, random - VITAMIN D 25 Hydroxy     Discussed  regular exercise, BP monitoring, weight control to achieve/maintain BMI less than 25 and discussed med and SE's. Recommended labs to assess and monitor clinical status with further disposition pending results of labs. Over 30 minutes of exam, counseling, chart review was performed.

## 2016-05-12 LAB — INSULIN, RANDOM: INSULIN: 5.9 u[IU]/mL (ref 2.0–19.6)

## 2016-05-12 LAB — VITAMIN D 25 HYDROXY (VIT D DEFICIENCY, FRACTURES): VIT D 25 HYDROXY: 29 ng/mL — AB (ref 30–100)

## 2016-05-12 LAB — MAGNESIUM: MAGNESIUM: 2 mg/dL (ref 1.5–2.5)

## 2016-05-12 LAB — HEMOGLOBIN A1C
Hgb A1c MFr Bld: 5.5 % (ref <5.7)
MEAN PLASMA GLUCOSE: 111 mg/dL

## 2016-08-12 NOTE — Progress Notes (Signed)
Assessment and Plan:   Essential hypertension - continue medications, DASH diet, exercise and monitor at home. Call if greater than 130/80.  -     CBC with Differential/Platelet -     BASIC METABOLIC PANEL WITH GFR -     Hepatic function panel -     TSH  Hyperlipidemia, unspecified hyperlipidemia type -continue medications, check lipids, decrease fatty foods, increase activity.  -     Lipid panel  Medication management -     Magnesium  Iron deficiency anemia, unspecified iron deficiency anemia type -     CBC with Differential/Platelet  Chronic anxiety  Pain in both knees, unspecified chronicity Tylenol, exercises given -     Ambulatory referral to [facility] and meds as discussed. Further disposition pending results of labs. Over 30 minutes of exam, counseling, chart review, and critical decision making was performed Future Appointments Date Time Provider Esmeralda  10/28/2016 10:00 AM Vicie Mutters, PA-C [facility] None     HPI 81 y.o. female  presents for 3 month follow up on hypertension, cholesterol, prediabetes, and vitamin D deficiency.  She was recently put on 10,000 IU for vitamin D but has had diarrhea since that time, non black stool, blood in stool, fever or chills, no sick contacts.   Her blood pressure has been controlled at home, today their BP is BP: 130/80  She does not workout. She denies chest pain, shortness of breath, dizziness.  She is on cholesterol medication and denies myalgias. Her cholesterol is not at goal. The cholesterol last visit was:   Lab Results  Component Value Date   CHOL 259 (H) 05/11/2016   HDL 82 05/11/2016   LDLCALC 144 (H) 05/11/2016   TRIG 165 (H) 05/11/2016   CHOLHDL 3.2 05/11/2016    She has been working on diet and exercise for prediabetes, and denies paresthesia of the feet, polydipsia, polyuria and visual disturbances. Last A1C in the office was:  Lab Results  Component Value Date   HGBA1C 5.5  05/11/2016   Patient is on Vitamin D supplement, she is on 10,000.  Lab Results  Component Value Date   VD25OH 29 (L) 05/11/2016     She has history of GERD/ulcer, has history of iron def, following with Dr. Julien Nordmann but has been released.  Has not followed up with GI. Denies black stool.  Lab Results  Component Value Date   IRON 47 02/09/2016   TIBC 471 (H) 02/09/2016   FERRITIN 10 (L) 02/09/2016   Lab Results  Component Value Date   WBC 5.5 05/11/2016   HGB 12.3 05/11/2016   HCT 39.1 05/11/2016   MCV 74.8 (L) 05/11/2016   PLT 346 05/11/2016   BMI is Body mass index is 25.11 kg/m., she is working on diet and exercise. Wt Readings from Last 3 Encounters:  08/16/16 148 lb 9.6 oz (67.4 kg)  05/11/16 145 lb (65.8 kg)  02/09/16 146 lb 12.8 oz (66.6 kg)     Current Medications:  Current Outpatient Prescriptions on File Prior to Visit  Medication Sig Dispense Refill  . alendronate (FOSAMAX) 70 MG tablet Take 1 tablet (70 mg total) by mouth once a week. 4 tablet 3  . Cholecalciferol (VITAMIN D PO) Take 2,000 Units by mouth daily.    . Eszopiclone 3 MG TABS     . Fe Fum-FePoly-FA-Vit C-Vit B3 (INTEGRA F) 125-1 MG CAPS Take 1 capsule by mouth daily. 30 capsule 3  . lansoprazole (PREVACID) 15 MG  capsule Take by mouth.    Marland Kitchen LORazepam (ATIVAN) 1 MG tablet Take 1 mg by mouth every 8 (eight) hours.      Marland Kitchen OVER THE COUNTER MEDICATION Takes Tums 2 tablets daily    . traZODone (DESYREL) 50 MG tablet     . venlafaxine XR (EFFEXOR-XR) 150 MG 24 hr capsule      No current facility-administered medications on file prior to visit.    Medical History:  Past Medical History:  Diagnosis Date  . Allergic rhinitis   . Anemia   . Anxiety   . Arthritis   . Depression   . GERD (gastroesophageal reflux disease)   . Hyperlipidemia   . Hypertension   . Migraine   . Prediabetes   . Sleep apnea   . Vitamin D deficiency    Allergies:  Allergies  Allergen Reactions  . Morphine And Related      Review of Systems:  Review of Systems  Constitutional: Negative for chills, diaphoresis, fever, malaise/fatigue and weight loss.  HENT: Negative.   Eyes: Negative.   Respiratory: Negative.   Cardiovascular: Negative.   Gastrointestinal: Negative.   Genitourinary: Negative.   Musculoskeletal: Positive for back pain and joint pain (bilateral knees). Negative for falls, myalgias and neck pain.  Skin: Negative.   Neurological: Negative.  Negative for weakness.  Psychiatric/Behavioral: Negative.     Family history- Review and unchanged Social history- Review and unchanged Physical Exam: BP 130/80   Pulse 91   Temp 98.1 F (36.7 C)   Resp 18   Ht 5' 4.5" (1.638 m)   Wt 148 lb 9.6 oz (67.4 kg)   SpO2 97%   BMI 25.11 kg/m  Wt Readings from Last 3 Encounters:  08/16/16 148 lb 9.6 oz (67.4 kg)  05/11/16 145 lb (65.8 kg)  02/09/16 146 lb 12.8 oz (66.6 kg)   General Appearance: Well nourished, in no apparent distress. Eyes: PERRLA, EOMs, conjunctiva no swelling or erythema Sinuses: No Frontal/maxillary tenderness ENT/Mouth: Ext aud canals clear, TMs without erythema, bulging. No erythema, swelling, or exudate on post pharynx.  Tonsils not swollen or erythematous. Hearing normal.  Neck: Supple, thyroid normal.  Respiratory: Respiratory effort normal, BS equal bilaterally without rales, rhonchi, wheezing or stridor.  Cardio: RRR with no MRGs. Brisk peripheral pulses without edema.  Abdomen: Soft, + BS,  Non tender, no guarding, rebound, hernias, masses. Lymphatics: Non tender without lymphadenopathy.  Musculoskeletal: Full ROM, 5/5 strength, Normal gait, + crepitus bilateral knees, negative straight leg raise, + paraspinus tenderness.  Skin: Warm, dry without rashes, lesions, ecchymosis.  Neuro: Cranial nerves intact. Normal muscle tone, no cerebellar symptoms. Psych: Awake and oriented X 3, normal affect, Insight and Judgment appropriate.    Vicie Mutters, PA-C 11:13  AM [facility]

## 2016-08-16 ENCOUNTER — Other Ambulatory Visit: Payer: Self-pay | Admitting: Physician Assistant

## 2016-08-16 ENCOUNTER — Ambulatory Visit (INDEPENDENT_AMBULATORY_CARE_PROVIDER_SITE_OTHER): Payer: Medicare Other | Admitting: Physician Assistant

## 2016-08-16 ENCOUNTER — Encounter: Payer: Self-pay | Admitting: Physician Assistant

## 2016-08-16 VITALS — BP 130/80 | HR 91 | Temp 98.1°F | Resp 18 | Ht 64.5 in | Wt 148.6 lb

## 2016-08-16 DIAGNOSIS — E785 Hyperlipidemia, unspecified: Secondary | ICD-10-CM

## 2016-08-16 DIAGNOSIS — M25562 Pain in left knee: Secondary | ICD-10-CM | POA: Diagnosis not present

## 2016-08-16 DIAGNOSIS — D509 Iron deficiency anemia, unspecified: Secondary | ICD-10-CM

## 2016-08-16 DIAGNOSIS — Z79899 Other long term (current) drug therapy: Secondary | ICD-10-CM

## 2016-08-16 DIAGNOSIS — M25561 Pain in right knee: Secondary | ICD-10-CM | POA: Diagnosis not present

## 2016-08-16 DIAGNOSIS — I10 Essential (primary) hypertension: Secondary | ICD-10-CM | POA: Diagnosis not present

## 2016-08-16 DIAGNOSIS — F419 Anxiety disorder, unspecified: Secondary | ICD-10-CM | POA: Diagnosis not present

## 2016-08-16 LAB — CBC WITH DIFFERENTIAL/PLATELET
BASOS ABS: 49 {cells}/uL (ref 0–200)
Basophils Relative: 1 %
EOS ABS: 0 {cells}/uL — AB (ref 15–500)
Eosinophils Relative: 0 %
HCT: 35.6 % (ref 35.0–45.0)
Hemoglobin: 11.5 g/dL — ABNORMAL LOW (ref 11.7–15.5)
LYMPHS PCT: 25 %
Lymphs Abs: 1225 cells/uL (ref 850–3900)
MCH: 24.9 pg — AB (ref 27.0–33.0)
MCHC: 32.3 g/dL (ref 32.0–36.0)
MCV: 77.1 fL — AB (ref 80.0–100.0)
MONOS PCT: 10 %
MPV: 10 fL (ref 7.5–12.5)
Monocytes Absolute: 490 cells/uL (ref 200–950)
Neutro Abs: 3136 cells/uL (ref 1500–7800)
Neutrophils Relative %: 64 %
PLATELETS: 344 10*3/uL (ref 140–400)
RBC: 4.62 MIL/uL (ref 3.80–5.10)
RDW: 15.1 % — AB (ref 11.0–15.0)
WBC: 4.9 10*3/uL (ref 3.8–10.8)

## 2016-08-16 LAB — BASIC METABOLIC PANEL WITH GFR
BUN: 11 mg/dL (ref 7–25)
CHLORIDE: 108 mmol/L (ref 98–110)
CO2: 23 mmol/L (ref 20–31)
CREATININE: 0.69 mg/dL (ref 0.60–0.93)
Calcium: 9.2 mg/dL (ref 8.6–10.4)
GFR, Est African American: >89 mL/min (ref >=60)
GFR, Est Non African American: 84 mL/min (ref >=60)
Glucose, Bld: 67 mg/dL (ref 65–99)
Potassium: 3.7 mmol/L (ref 3.5–5.3)
Sodium: 141 mmol/L (ref 135–146)

## 2016-08-16 LAB — HEPATIC FUNCTION PANEL
ALBUMIN: 3.9 g/dL (ref 3.6–5.1)
ALT: 19 U/L (ref 6–29)
AST: 23 U/L (ref 10–35)
Alkaline Phosphatase: 95 U/L (ref 33–130)
Bilirubin, Direct: 0.1 mg/dL (ref <=0.2)
Indirect Bilirubin: 0.2 mg/dL (ref 0.2–1.2)
TOTAL PROTEIN: 6.5 g/dL (ref 6.1–8.1)
Total Bilirubin: 0.3 mg/dL (ref 0.2–1.2)

## 2016-08-16 LAB — LIPID PANEL
Cholesterol: 217 mg/dL — ABNORMAL HIGH (ref <200)
HDL: 75 mg/dL (ref >50)
LDL Cholesterol: 118 mg/dL — ABNORMAL HIGH (ref <100)
Total CHOL/HDL Ratio: 2.9 Ratio (ref <5.0)
Triglycerides: 121 mg/dL (ref <150)
VLDL: 24 mg/dL (ref <30)

## 2016-08-16 LAB — TSH: TSH: 2.14 m[IU]/L

## 2016-08-16 NOTE — Patient Instructions (Addendum)
Tumeric with black pepper extract is a great natural antiinflammatory that helps with arthritis and aches and pain. Can get from [employer] or any health food store. Need to take at least 861m twice a day with food.    Back Injury Prevention Back injuries can be very painful. They can also be difficult to heal. After having one back injury, you are more likely to injure your back again. It is important to learn how to avoid injuring or re-injuring your back. The following tips can help you to prevent a back injury. What should I know about physical fitness?  Exercise for 30 minutes per day on most days of the week or as told by your doctor. Make sure to: ? Do aerobic exercises, such as walking, jogging, biking, or swimming. ? Do exercises that increase balance and strength, such as tai chi and yoga. ? Do stretching exercises. This helps with flexibility. ? Try to develop strong belly (abdominal) muscles. Your belly muscles help to support your back.  Stay at a healthy weight. This helps to decrease your risk of a back injury. What should I know about my diet?  Talk with your doctor about your overall diet. Take supplements and vitamins only as told by your doctor.  Talk with your doctor about how much calcium and vitamin D you need each day. These nutrients help to prevent weakening of the bones (osteoporosis).  Include good sources of calcium in your diet, such as: ? Dairy products. ? Green leafy vegetables. ? Products that have had calcium added to them (fortified).  Include good sources of vitamin D in your diet, such as: ? Milk. ? Foods that have had vitamin D added to them. What should I know about my posture?  Sit up straight and stand up straight. Avoid leaning forward when you sit or hunching over when you stand.  Choose chairs that have good low-back (lumbar) support.  If you work at a desk, sit close to it so you do not need to lean over. Keep your chin tucked in. Keep your  neck drawn back. Keep your elbows bent so your arms look like the letter "L" (right angle).  Sit high and close to the steering wheel when you drive. Add a low-back support to your car seat, if needed.  Avoid sitting or standing in one position for very long. Take breaks to get up, stretch, and walk around at least one time every hour. Take breaks every hour if you are driving for long periods of time.  Sleep on your side with your knees slightly bent, or sleep on your back with a pillow under your knees. Do not lie on the front of your body to sleep. What should I know about lifting, twisting, and reaching? Lifting and Heavy Lifting   Avoid heavy lifting, especially lifting over and over again. If you must do heavy lifting: ? Stretch before lifting. ? Work slowly. ? Rest between lifts. ? Use a tool such as a cart or a dolly to move objects if one is available. ? Make several small trips instead of carrying one heavy load. ? Ask for help when you need it, especially when moving big objects.  Follow these steps when lifting: ? Stand with your feet shoulder-width apart. ? Get as close to the object as you can. Do not pick up a heavy object that is far from your body. ? Use handles or lifting straps if they are available. ? Bend at your knees.  Squat down, but keep your heels off the floor. ? Keep your shoulders back. Keep your chin tucked in. Keep your back straight. ? Lift the object slowly while you tighten the muscles in your legs, belly, and butt. Keep the object as close to the center of your body as possible.  Follow these steps when putting down a heavy load: ? Stand with your feet shoulder-width apart. ? Lower the object slowly while you tighten the muscles in your legs, belly, and butt. Keep the object as close to the center of your body as possible. ? Keep your shoulders back. Keep your chin tucked in. Keep your back straight. ? Bend at your knees. Squat down, but keep your  heels off the floor. ? Use handles or lifting straps if they are available. Twisting and Reaching  Avoid lifting heavy objects above your waist.  Do not twist at your waist while you are lifting or carrying a load. If you need to turn, move your feet.  Do not bend over without bending at your knees.  Avoid reaching over your head, across a table, or for an object on a high surface. What are some other tips?  Avoid wet floors and icy ground. Keep sidewalks clear of ice to prevent falls.  Do not sleep on a mattress that is too soft or too hard.  Keep items that you use often within easy reach.  Put heavier objects on shelves at waist level, and put lighter objects on lower or higher shelves.  Find ways to lower your stress, such as: ? Exercise. ? Massage. ? Relaxation techniques.  Talk with your doctor if you feel anxious or depressed. These conditions can make back pain worse.  Wear flat heel shoes with cushioned soles.  Avoid making quick (sudden) movements.  Use both shoulder straps when carrying a backpack.  Do not use any tobacco products, including cigarettes, chewing tobacco, or electronic cigarettes. If you need help quitting, ask your doctor. This information is not intended to replace advice given to you by your health care provider. Make sure you discuss any questions you have with your health care provider. Document Released: 06/30/2007 Document Revised: 06/19/2015 Document Reviewed: 01/15/2014 Elsevier Interactive Patient Education  [employer].

## 2016-08-17 LAB — IRON AND TIBC
%SAT: 18 % (ref 11–50)
IRON: 74 ug/dL (ref 45–160)
TIBC: 421 ug/dL (ref 250–450)
UIBC: 347 ug/dL

## 2016-08-17 LAB — MAGNESIUM: MAGNESIUM: 2 mg/dL (ref 1.5–2.5)

## 2016-08-17 NOTE — Progress Notes (Signed)
LVM for pt to return office call for LAB results. Message sent to lab to add on IRON

## 2016-08-18 NOTE — Progress Notes (Signed)
LVM for pt to return office call for LAB results.

## 2016-08-23 NOTE — Progress Notes (Signed)
Pt aware of lab results & voiced understanding of those results.

## 2016-09-17 ENCOUNTER — Other Ambulatory Visit: Payer: Self-pay | Admitting: Physician Assistant

## 2016-09-17 ENCOUNTER — Other Ambulatory Visit: Payer: Self-pay | Admitting: Internal Medicine

## 2016-09-17 DIAGNOSIS — Z1231 Encounter for screening mammogram for malignant neoplasm of breast: Secondary | ICD-10-CM

## 2016-10-16 DIAGNOSIS — M25561 Pain in right knee: Secondary | ICD-10-CM | POA: Diagnosis not present

## 2016-10-16 DIAGNOSIS — M1712 Unilateral primary osteoarthritis, left knee: Secondary | ICD-10-CM | POA: Diagnosis not present

## 2016-10-16 DIAGNOSIS — M25562 Pain in left knee: Secondary | ICD-10-CM | POA: Diagnosis not present

## 2016-10-16 DIAGNOSIS — M1711 Unilateral primary osteoarthritis, right knee: Secondary | ICD-10-CM | POA: Diagnosis not present

## 2016-10-22 ENCOUNTER — Ambulatory Visit
Admission: RE | Admit: 2016-10-22 | Discharge: 2016-10-22 | Disposition: A | Discharge status: Home / Self Care | Payer: Medicare Other | Source: Ambulatory Visit | Attending: Internal Medicine | Admitting: Internal Medicine

## 2016-10-22 DIAGNOSIS — Z1231 Encounter for screening mammogram for malignant neoplasm of breast: Secondary | ICD-10-CM

## 2016-10-25 ENCOUNTER — Other Ambulatory Visit: Payer: Self-pay | Admitting: Internal Medicine

## 2016-10-25 ENCOUNTER — Ambulatory Visit: Payer: Medicare Other

## 2016-10-25 DIAGNOSIS — R928 Other abnormal and inconclusive findings on diagnostic imaging of breast: Secondary | ICD-10-CM

## 2016-10-28 ENCOUNTER — Encounter: Payer: Self-pay | Admitting: Physician Assistant

## 2016-10-29 ENCOUNTER — Ambulatory Visit
Admission: RE | Admit: 2016-10-29 | Discharge: 2016-10-29 | Disposition: A | Discharge status: Home / Self Care | Payer: Medicare Other | Source: Ambulatory Visit | Attending: Internal Medicine | Admitting: Internal Medicine

## 2016-10-29 DIAGNOSIS — R928 Other abnormal and inconclusive findings on diagnostic imaging of breast: Secondary | ICD-10-CM

## 2016-10-29 DIAGNOSIS — N6323 Unspecified lump in the left breast, lower outer quadrant: Secondary | ICD-10-CM | POA: Diagnosis not present

## 2016-12-06 ENCOUNTER — Ambulatory Visit (INDEPENDENT_AMBULATORY_CARE_PROVIDER_SITE_OTHER): Payer: Medicare Other | Admitting: Physician Assistant

## 2016-12-06 ENCOUNTER — Encounter: Payer: Self-pay | Admitting: Physician Assistant

## 2016-12-06 VITALS — BP 124/86 | HR 86 | Temp 97.5°F | Resp 14 | Ht 65.0 in | Wt 145.2 lb

## 2016-12-06 DIAGNOSIS — E785 Hyperlipidemia, unspecified: Secondary | ICD-10-CM

## 2016-12-06 DIAGNOSIS — E559 Vitamin D deficiency, unspecified: Secondary | ICD-10-CM

## 2016-12-06 DIAGNOSIS — Z86010 Personal history of colonic polyps: Secondary | ICD-10-CM

## 2016-12-06 DIAGNOSIS — I10 Essential (primary) hypertension: Secondary | ICD-10-CM | POA: Diagnosis not present

## 2016-12-06 DIAGNOSIS — Z6824 Body mass index (BMI) 24.0-24.9, adult: Secondary | ICD-10-CM

## 2016-12-06 DIAGNOSIS — Z0000 Encounter for general adult medical examination without abnormal findings: Secondary | ICD-10-CM

## 2016-12-06 DIAGNOSIS — Z136 Encounter for screening for cardiovascular disorders: Secondary | ICD-10-CM | POA: Diagnosis not present

## 2016-12-06 DIAGNOSIS — Z79899 Other long term (current) drug therapy: Secondary | ICD-10-CM

## 2016-12-06 DIAGNOSIS — F419 Anxiety disorder, unspecified: Secondary | ICD-10-CM

## 2016-12-06 DIAGNOSIS — M810 Age-related osteoporosis without current pathological fracture: Secondary | ICD-10-CM

## 2016-12-06 DIAGNOSIS — Z0001 Encounter for general adult medical examination with abnormal findings: Secondary | ICD-10-CM

## 2016-12-06 DIAGNOSIS — Z860101 Personal history of adenomatous and serrated colon polyps: Secondary | ICD-10-CM

## 2016-12-06 DIAGNOSIS — D509 Iron deficiency anemia, unspecified: Secondary | ICD-10-CM

## 2016-12-06 DIAGNOSIS — R7303 Prediabetes: Secondary | ICD-10-CM

## 2016-12-06 DIAGNOSIS — K449 Diaphragmatic hernia without obstruction or gangrene: Secondary | ICD-10-CM

## 2016-12-06 MED ORDER — VENLAFAXINE HCL ER 75 MG PO CP24: 225.0000 mg | ORAL_CAPSULE | Freq: Every day | ORAL | 1 refills | Status: DC

## 2016-12-06 NOTE — Patient Instructions (Addendum)
For the winter months we will increase your effexor to 3 pills a day 225mg  a day, when things get better/spring, can go back down to 150mg  a day   - Try the Flonase or Nasonex. Remember to spray each nostril twice towards the outer part of your eye.  Do not sniff but instead pinch your nose and tilt your head back to help the medicine get into your sinuses.  The best time to do this is at bedtime.Stop if you get blurred vision or nose bleeds.    -Please pick one of the over the counter allergy medications below and take it once daily for allergies.  It will also help with fluid behind ear drums. Claritin or loratadine cheapest but likely the weakest  Zyrtec or certizine at night because it can make you sleepy The strongest is allegra or fexafinadine  Cheapest at [employer], [employer], [employer]   if worsening HA, changes vision/speech, imbalance, weakness go to the ER    Seasonal Affective Disorder Seasonal affective disorder (SAD) is a form of depression. It is when you feel sad, down, or blue at specific times of the year. The most common time of year for this is late fall and winter, when the days are shorter and most people spend less time outdoors. This is why SAD is also known as the "winter blues." SAD occurs less commonly in the spring or summer. SAD can vary in severity and can interfere with work, school, relationships, and normal daily activities. What increases the risk? This condition is more common in:  [place] women.  People who have a history of clinical depression or bipolar disorder.  People who live far [place] or far [place] of the equator.  What are the signs or symptoms? Symptoms of this condition include:  Depressed mood, such as: ? Feeling sad, down, blue, or teary. ? Having crying spells.  Irritability.  Trouble sleeping or sleeping more than usual.  Loss of interest in activities that you usually enjoy.  Feelings of guilt or worthlessness.  Restlessness or loss  of energy.  Difficulty concentrating, remembering, or making decisions.  Significant change in appetite or weight.  Recurrent wishes for death, recurrent thoughts of self-harm, or an attempt at suicide.  How is this diagnosed? Diagnosis of this condition is usually made through an assessment by your health care provider. You will be asked about your moods, thoughts, and behaviors. You will also be asked about your medical history, any major life changes, medicines, and substance use. A physical exam and lab work may be done to make sure there is no other cause for your depression. You may be referred to a mental health specialist for further evaluation. How is this treated? Treatment for this condition may include:  Light therapy. Light therapy involves sitting in front of a light source for 15-30 minutes every day. It is thought to work by increasing the duration of daylight that is sensed by the brain.  Antidepressant medicine.  Cognitive behavioral therapy (CBT).CBT is a form of talk therapy that helps to identify and change negative thoughts that are associated with SAD.  Follow these instructions at home:  Take over-the-counter and prescription medicines only as told by your healthcare provider. This is important.  Check with your health care provider before you start taking any new prescriptions, over-the-counter medicines, herbs, or supplements.  Keep all follow-up visits as told by your health care provider.This is important.  Maintain a healthy lifestyle. ? Eat healthy foods. ? Get  plenty of sleep. ? Exercise regularly. ? Do not drink alcohol. ? Do not use recreational drugs.  Make your home and work environment as sunny or bright as possible. Open blinds and spend as much time as possible outside. Contact a health care provider if:  Your medicines do not seem to be helping.  Your symptoms do not improve or they get worse.  You have trouble taking care of  yourself.  You experience side effects of medicines, such as changes in sleep patterns, dizziness, drowsiness, weight gain, restlessness, movement changes, or tremors. Get help right away if:  You have serious thoughts about hurting yourself or others.  You experience serious side effects of medicine, such as: ? Swelling of your face, lips, tongue, or throat. ? Fever, confusion, muscle spasms, or seizures. This information is not intended to replace advice given to you by your health care provider. Make sure you discuss any questions you have with your health care provider. Document Released: 10/06/2000 Document Revised: 06/19/2015 Document Reviewed: 01/15/2014 Elsevier Interactive Patient Education  [employer].

## 2016-12-06 NOTE — Progress Notes (Signed)
CPE AND 3 MONTH FOLLOW  Assessment:   Essential hypertension - continue medications, DASH diet, exercise and monitor at home. Call if greater than 130/80.  - CBC with Differential - BASIC METABOLIC PANEL WITH GFR - Hepatic function panel - TSH - EKG 12-Lead   Prediabetes Discussed disease progression and risks Discussed diet/exercise, weight management and risk modification A1C  Chronic anxiety - continue medications, suggest getting counseling, names given, no suicidal/homicidal ideations. Increase effexor for winter -     venlafaxine XR (EFFEXOR XR) 75 MG 24 hr capsule; Take 3 capsules (225 mg total) daily by mouth.  Hyperlipidemia -continue medications, check lipids, decrease fatty foods, increase activity.  - Lipid panel - Urinalysis, Routine w reflex microscopic - Microalbumin / creatinine urine ratio  Vitamin D deficiency - Vit D  25 hydroxy (rtn osteoporosis monitoring)  GERD Continue PPI/H2 blocker, diet discussed  Anemia-  Check iron, CBC  Hx of adenomatous colonic polyps  Hiatal hernia  Encounter for general adult medical examination with abnormal findings  BMI 24.0-24.9, adult     Future Appointments  Date Time Provider Arthur  12/19/2017  3:00 PM Vicie Mutters, PA-C [facility] None    Subjective:   Stephanie Roach is a 81 y.o. female who presents for complete physical and follow up for chol, preDM, and HTN.    Her blood pressure has been controlled at home, today their BP is BP: 124/86 She does not workout.  She denies chest pain, shortness of breath, dizziness.  She is on cholesterol medication and denies myalgias. Her cholesterol is at goal. The cholesterol last visit was:   Lab Results  Component Value Date   CHOL 217 (H) 08/16/2016   HDL 75 08/16/2016   LDLCALC 118 (H) 08/16/2016   TRIG 121 08/16/2016   CHOLHDL 2.9 08/16/2016    She has been working on diet and exercise for prediabetes, and denies polydipsia, polyuria and  visual disturbances. Last A1C in the office was:  Lab Results  Component Value Date   HGBA1C 5.5 05/11/2016   Patient is on Vitamin D supplement, 2000 IU.   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 She was on integra and following with Dr. Julien Nordmann but she has been released from his practice. She has not been on the integra.  Husband, Juanda Crumble diagnosed with parkinson's and he is declining very quickly. She states she worries a lot about him, no stomach issues with it, states the worrying makes her tired. She snores if she lies on her back. She is on effexor 150mg  a day which helps.  Saw Dr. Berenice Primas for bilateral knee pain, got injections that helped.  BMI is Body mass index is 24.16 kg/m., she is working on diet and exercise. Wt Readings from Last 3 Encounters:  12/06/16 145 lb 3.2 oz (65.9 kg)  08/16/16 148 lb 9.6 oz (67.4 kg)  05/11/16 145 lb (65.8 kg)     Names of Other Physician/Practitioners you currently use: 1. [facility]- here for primary care 2. Dr. Katy Fitch, eye doctor, last visit 3 months 3. Dr. Osa Craver, dentist, last visit 6 months Patient Care Team: Unk Pinto, MD as PCP - General (Internal Medicine) Lafayette Dragon, MD (Inactive) as Consulting Physician (Gastroenterology) Norma Fredrickson, MD as Consulting Physician (Psychiatry) Maia Breslow, MD  as Consulting Physician (Orthopedic Surgery) Curt Bears, MD as Consulting Physician (Oncology)   Medication Review Current Outpatient Medications on File Prior to Visit  Medication Sig Dispense Refill  . Cholecalciferol (VITAMIN D PO) Take 2,000 Units by mouth daily.    . Fe Fum-FePoly-FA-Vit C-Vit B3 (INTEGRA F) 125-1 MG CAPS Take 1 capsule by mouth daily. 30 capsule 3  . lansoprazole (PREVACID) 15 MG capsule Take by  mouth.    Marland Kitchen LORazepam (ATIVAN) 1 MG tablet Take 1 mg by mouth every 8 (eight) hours.      Marland Kitchen OVER THE COUNTER MEDICATION Takes Tums 2 tablets daily    . traZODone (DESYREL) 50 MG tablet     . venlafaxine XR (EFFEXOR-XR) 150 MG 24 hr capsule      No current facility-administered medications on file prior to visit.     Current Problems (verified) Patient Active Problem List   Diagnosis Date Noted  . Medication management 02/18/2015  . Hiatal hernia 02/18/2015  . Encounter for Medicare annual wellness exam 02/18/2015  . Osteoporosis 09/17/2014  . Prediabetes   . Hyperlipidemia   . Hypertension   . Vitamin D deficiency   . Hx of adenomatous colonic polyps 08/11/2010  . Iron deficiency anemia 08/11/2010  . Chronic anxiety 08/11/2010    Screening Tests Immunization History  Administered Date(s) Administered  . Pneumococcal Conjugate-13 08/27/2013  . Pneumococcal Polysaccharide-23 10/23/2015  . Pneumococcal-Unspecified 12/06/2001  . Tdap 08/05/2012  . Zoster 02/05/2010    Preventative care: Last colonoscopy: 2015 EGD 2015 Last mammogram: 10/2016 Last pap smear/pelvic exam: 2012  DEXA: 09/2015 + osteoporosis off the fosamax- could not tolerate due to her stomach.   Prior vaccinations: TD or Tdap: 2014  Influenza: declines Prevnar 13: 2015 Pneumococcal: 2017 Shingles/Zostavax: 2012  Allergies Allergies  Allergen Reactions  . Morphine And Related     SURGICAL HISTORY She  has a past surgical history that includes Cholecystectomy (2003); Knee arthroscopy; Bunionectomy; Wrist fracture surgery; and Cataract extraction (Bilateral). FAMILY HISTORY Her family history includes Breast cancer in her cousin; Colon cancer in her father; Colon polyps in her maternal aunt; Pancreatic cancer in her paternal aunt; Stroke in her mother; Ulcerative colitis in her brother. SOCIAL HISTORY She  reports that  has never smoked. she has never used smokeless tobacco. She reports that she  does not drink alcohol or use drugs.  Review of Systems  Constitutional: Positive for malaise/fatigue. Negative for chills, diaphoresis, fever and weight loss.  HENT: Negative.   Eyes: Negative.   Respiratory: Negative.   Cardiovascular: Negative.   Gastrointestinal: Negative.   Genitourinary: Negative.   Musculoskeletal: Negative.   Skin: Negative.   Neurological: Negative.  Negative for weakness.  Psychiatric/Behavioral: Negative.      Objective:     Blood pressure 124/86, pulse 86, temperature (!) 97.5 F (36.4 C), resp. rate 14, height 5\' 5"  (1.651 m), weight 145 lb 3.2 oz (65.9 kg), SpO2 96 %. Body mass index is 24.16 kg/m.  General appearance: alert, no distress, WD/WN,  female HEENT: normocephalic, sclerae anicteric, TMs pearly, nares patent, no discharge or erythema, pharynx normal Oral cavity: MMM, no lesions Neck: supple, no lymphadenopathy, no thyromegaly, no masses Heart: RRR, normal S1, S2, no murmurs Lungs: CTA bilaterally, no wheezes, rhonchi, or rales Abdomen: +bs, soft, non tender, non distended, no masses, no hepatomegaly, no splenomegaly Musculoskeletal: nontender, no swelling, no obvious deformity Extremities: no edema, no cyanosis, no clubbing Pulses: 2+ symmetric, upper and lower extremities, normal cap refill Neurological: alert,  oriented x 3, CN2-12 intact, strength normal upper extremities and lower extremities, sensation normal throughout, DTRs 2+ throughout, no cerebellar signs, gait normal Psychiatric: normal affect, behavior normal, pleasant  Breast:   breasts appear normal, no suspicious masses, no skin or nipple changes or axillary nodes. Gyn: defer  Rectal: defer  EKG WNL- no ST changes  Vicie Mutters, PA-C   12/06/2016

## 2016-12-07 ENCOUNTER — Other Ambulatory Visit: Payer: Self-pay | Admitting: Physician Assistant

## 2016-12-07 DIAGNOSIS — D508 Other iron deficiency anemias: Secondary | ICD-10-CM

## 2016-12-07 LAB — URINALYSIS, ROUTINE W REFLEX MICROSCOPIC
BILIRUBIN URINE: NEGATIVE
Bacteria, UA: NONE SEEN /HPF
Glucose, UA: NEGATIVE
Hgb urine dipstick: NEGATIVE
Hyaline Cast: NONE SEEN /LPF
Ketones, ur: NEGATIVE
Nitrite: NEGATIVE
Protein, ur: NEGATIVE
RBC / HPF: NONE SEEN /HPF (ref 0–2)
SPECIFIC GRAVITY, URINE: 1.024 (ref 1.001–1.03)
pH: <=5 (ref 5.0–8.0)

## 2016-12-07 LAB — CBC WITH DIFFERENTIAL/PLATELET
BASOS ABS: 39 {cells}/uL (ref 0–200)
Basophils Relative: 0.7 %
EOS ABS: 11 {cells}/uL — AB (ref 15–500)
Eosinophils Relative: 0.2 %
HEMATOCRIT: 29.7 % — AB (ref 35.0–45.0)
HEMOGLOBIN: 9.3 g/dL — AB (ref 11.7–15.5)
Lymphs Abs: 1708 cells/uL (ref 850–3900)
MCH: 22.4 pg — AB (ref 27.0–33.0)
MCHC: 31.3 g/dL — AB (ref 32.0–36.0)
MCV: 71.4 fL — ABNORMAL LOW (ref 80.0–100.0)
MPV: 10.7 fL (ref 7.5–12.5)
Monocytes Relative: 10.1 %
NEUTROS ABS: 3276 {cells}/uL (ref 1500–7800)
Neutrophils Relative %: 58.5 %
Platelets: 492 10*3/uL — ABNORMAL HIGH (ref 140–400)
RBC: 4.16 10*6/uL (ref 3.80–5.10)
RDW: 14.7 % (ref 11.0–15.0)
Total Lymphocyte: 30.5 %
WBC mixed population: 566 cells/uL (ref 200–950)
WBC: 5.6 10*3/uL (ref 3.8–10.8)

## 2016-12-07 LAB — LIPID PANEL
CHOLESTEROL: 228 mg/dL — AB (ref <200)
HDL: 71 mg/dL (ref >50)
LDL Cholesterol (Calc): 129 mg/dL (calc) — ABNORMAL HIGH
Non-HDL Cholesterol (Calc): 157 mg/dL (calc) — ABNORMAL HIGH (ref <130)
TRIGLYCERIDES: 161 mg/dL — AB (ref <150)
Total CHOL/HDL Ratio: 3.2 (calc) (ref <5.0)

## 2016-12-07 LAB — FERRITIN: Ferritin: 6 ng/mL — ABNORMAL LOW (ref 20–288)

## 2016-12-07 LAB — IRON, TOTAL/TOTAL IRON BINDING CAP
%SAT: 6 % (calc) — ABNORMAL LOW (ref 11–50)
IRON: 30 ug/dL — AB (ref 45–160)
TIBC: 529 ug/dL — AB (ref 250–450)

## 2016-12-07 LAB — BASIC METABOLIC PANEL WITH GFR
BUN: 15 mg/dL (ref 7–25)
CO2: 27 mmol/L (ref 20–32)
CREATININE: 0.67 mg/dL (ref 0.60–0.93)
Calcium: 9.4 mg/dL (ref 8.6–10.4)
Chloride: 104 mmol/L (ref 98–110)
GFR, EST NON AFRICAN AMERICAN: 84 mL/min/{1.73_m2} (ref >=60)
GFR, Est African American: 98 mL/min/{1.73_m2} (ref >=60)
Glucose, Bld: 92 mg/dL (ref 65–99)
Potassium: 4.3 mmol/L (ref 3.5–5.3)
SODIUM: 140 mmol/L (ref 135–146)

## 2016-12-07 LAB — MAGNESIUM: MAGNESIUM: 2.4 mg/dL (ref 1.5–2.5)

## 2016-12-07 LAB — HEPATIC FUNCTION PANEL
AG Ratio: 1.5 (calc) (ref 1.0–2.5)
ALT: 11 U/L (ref 6–29)
AST: 13 U/L (ref 10–35)
Albumin: 4.3 g/dL (ref 3.6–5.1)
Alkaline phosphatase (APISO): 94 U/L (ref 33–130)
BILIRUBIN DIRECT: 0.1 mg/dL (ref 0.0–0.2)
GLOBULIN: 2.8 g/dL (ref 1.9–3.7)
Indirect Bilirubin: 0.2 mg/dL (calc) (ref 0.2–1.2)
Total Bilirubin: 0.3 mg/dL (ref 0.2–1.2)
Total Protein: 7.1 g/dL (ref 6.1–8.1)

## 2016-12-07 LAB — HEMOGLOBIN A1C
HEMOGLOBIN A1C: 5.9 %{Hb} — AB (ref <5.7)
Mean Plasma Glucose: 123 (calc)
eAG (mmol/L): 6.8 (calc)

## 2016-12-07 LAB — TSH: TSH: 2.3 m[IU]/L (ref 0.40–4.50)

## 2016-12-07 LAB — MICROALBUMIN / CREATININE URINE RATIO
CREATININE, URINE: 152 mg/dL (ref 20–275)
MICROALB UR: 1.3 mg/dL
Microalb Creat Ratio: 9 mcg/mg creat (ref <30)

## 2016-12-07 LAB — VITAMIN D 25 HYDROXY (VIT D DEFICIENCY, FRACTURES): VIT D 25 HYDROXY: 45 ng/mL (ref 30–100)

## 2016-12-07 MED ORDER — INTEGRA F 125-1 MG PO CAPS
1.0000 | ORAL_CAPSULE | Freq: Every day | ORAL | 3 refills | Status: DC
Start: 2016-12-07 — End: 2017-04-18

## 2016-12-09 NOTE — Progress Notes (Signed)
Pt aware of lab results & voiced understanding of those results.

## 2016-12-27 ENCOUNTER — Ambulatory Visit (INDEPENDENT_AMBULATORY_CARE_PROVIDER_SITE_OTHER): Payer: Medicare Other

## 2016-12-27 DIAGNOSIS — Z79899 Other long term (current) drug therapy: Secondary | ICD-10-CM

## 2016-12-27 DIAGNOSIS — D508 Other iron deficiency anemias: Secondary | ICD-10-CM

## 2016-12-27 NOTE — Addendum Note (Signed)
Addended by: Dolores Hoose on: 12/27/2016 11:52 AM   Modules accepted: Orders

## 2016-12-27 NOTE — Progress Notes (Signed)
Patient presents to the office for lab work only. No changes at all. Medication still the same.

## 2016-12-28 LAB — CBC WITH DIFFERENTIAL/PLATELET
BASOS ABS: 22 {cells}/uL (ref 0–200)
Basophils Relative: 0.5 %
EOS ABS: 0 {cells}/uL — AB (ref 15–500)
Eosinophils Relative: 0 %
HCT: 32.2 % — ABNORMAL LOW (ref 35.0–45.0)
Hemoglobin: 9.8 g/dL — ABNORMAL LOW (ref 11.7–15.5)
Lymphs Abs: 1232 cells/uL (ref 850–3900)
MCH: 22.5 pg — AB (ref 27.0–33.0)
MCHC: 30.4 g/dL — AB (ref 32.0–36.0)
MCV: 73.9 fL — AB (ref 80.0–100.0)
MONOS PCT: 8.5 %
MPV: 10.5 fL (ref 7.5–12.5)
NEUTROS PCT: 63 %
Neutro Abs: 2772 cells/uL (ref 1500–7800)
Platelets: 332 10*3/uL (ref 140–400)
RBC: 4.36 10*6/uL (ref 3.80–5.10)
RDW: 19 % — AB (ref 11.0–15.0)
TOTAL LYMPHOCYTE: 28 %
WBC: 4.4 10*3/uL (ref 3.8–10.8)
WBCMIX: 374 {cells}/uL (ref 200–950)

## 2016-12-28 LAB — RETICULOCYTES
ABS Retic: 78480 cells/uL (ref 20000–8000)
RETIC CT PCT: 1.8 %

## 2016-12-28 LAB — IRON, TOTAL/TOTAL IRON BINDING CAP
%SAT: 9 % (calc) — ABNORMAL LOW (ref 11–50)
IRON: 40 ug/dL — AB (ref 45–160)
TIBC: 445 mcg/dL (calc) (ref 250–450)

## 2016-12-28 LAB — FOLATE RBC: RBC Folate: 1288 ng/mL RBC (ref >280)

## 2016-12-28 LAB — FERRITIN: FERRITIN: 37 ng/mL (ref 20–288)

## 2016-12-28 NOTE — Progress Notes (Signed)
Pt aware of lab results & voiced understanding of those results. Hemo card has been mailed, pt is aware of this.

## 2017-04-18 ENCOUNTER — Other Ambulatory Visit: Payer: Self-pay

## 2017-04-18 DIAGNOSIS — D508 Other iron deficiency anemias: Secondary | ICD-10-CM

## 2017-04-18 MED ORDER — INTEGRA F 125-1 MG PO CAPS: 1.0000 | ORAL_CAPSULE | Freq: Every day | ORAL | 1 refills | Status: DC

## 2017-06-09 ENCOUNTER — Ambulatory Visit (INDEPENDENT_AMBULATORY_CARE_PROVIDER_SITE_OTHER): Payer: Medicare Other | Admitting: Internal Medicine

## 2017-06-09 VITALS — BP 124/82 | HR 76 | Temp 97.2°F | Resp 16 | Ht 64.75 in | Wt 147.6 lb

## 2017-06-09 DIAGNOSIS — R7309 Other abnormal glucose: Secondary | ICD-10-CM | POA: Diagnosis not present

## 2017-06-09 DIAGNOSIS — K219 Gastro-esophageal reflux disease without esophagitis: Secondary | ICD-10-CM | POA: Diagnosis not present

## 2017-06-09 DIAGNOSIS — E559 Vitamin D deficiency, unspecified: Secondary | ICD-10-CM

## 2017-06-09 DIAGNOSIS — R7303 Prediabetes: Secondary | ICD-10-CM

## 2017-06-09 DIAGNOSIS — Z79899 Other long term (current) drug therapy: Secondary | ICD-10-CM

## 2017-06-09 DIAGNOSIS — R0989 Other specified symptoms and signs involving the circulatory and respiratory systems: Secondary | ICD-10-CM

## 2017-06-09 DIAGNOSIS — E782 Mixed hyperlipidemia: Secondary | ICD-10-CM

## 2017-06-09 NOTE — Patient Instructions (Signed)

## 2017-06-09 NOTE — Progress Notes (Signed)
This very nice 82 y.o. MWF presents for 3 month follow up with labile HTN, HLD, Pre-Diabetes and Vitamin D Deficiency.      Patient is followed expectantly for hx/o labile HTN & BP has been controlled at home. Today's BP is at goal -  124/82. Patient has had no complaints of any cardiac type chest pain, palpitations, dyspnea / orthopnea / PND, dizziness, claudication, or dependent edema.     Hyperlipidemia is not  controlled with diet & she is reticent to taking meds for cholesterol. Last Lipids were not at goal: Lab Results  Component Value Date   CHOL 228 (H) 12/06/2016   HDL 71 12/06/2016   LDLCALC 129 (H) 12/06/2016   TRIG 161 (H) 12/06/2016   CHOLHDL 3.2 12/06/2016      Also, the patient has history of PreDiabetes (A1c 6.2%/2013)  and has had no symptoms of reactive hypoglycemia, diabetic polys, paresthesias or visual blurring.  Last A1c was not at goal: Lab Results  Component Value Date   HGBA1C 5.9 (H) 12/06/2016      Further, the patient also has history of Vitamin D Deficiency ("33"/2013)  and supplements vitamin D sporadically. Last vitamin D was not at goal (70-100):  Lab Results  Component Value Date   VD25OH 45 12/06/2016   Current Outpatient Medications on File Prior to Visit  Medication Sig  . Cholecalciferol (VITAMIN D PO) Take 2,000 Units by mouth daily.  . Fe Fum-FePoly-FA-Vit C-Vit B3 (INTEGRA F) 125-1 MG CAPS Take 1 capsule by mouth daily.  . lansoprazole (PREVACID) 15 MG capsule Take by mouth.  Marland Kitchen LORazepam (ATIVAN) 1 MG tablet Take 1 mg by mouth every 8 (eight) hours.    Marland Kitchen OVER THE COUNTER MEDICATION Takes Tums 2 tablets daily  . traZODone (DESYREL) 50 MG tablet 50 mg at bedtime as needed.   . venlafaxine XR (EFFEXOR XR) 75 MG 24 hr capsule Take 3 capsules (225 mg total) daily by mouth.   No current facility-administered medications on file prior to visit.    Allergies  Allergen Reactions  . Morphine And Related    PMHx:   Past Medical History:    Diagnosis Date  . Allergic rhinitis   . Anemia   . Anxiety   . Arthritis   . Depression   . GERD (gastroesophageal reflux disease)   . Hyperlipidemia   . Hypertension   . Migraine   . Prediabetes   . Sleep apnea   . Vitamin D deficiency    Immunization History  Administered Date(s) Administered  . Pneumococcal Conjugate-13 08/27/2013  . Pneumococcal Polysaccharide-23 10/23/2015  . Pneumococcal-Unspecified 12/06/2001  . Tdap 08/05/2012  . Zoster 02/05/2010   Past Surgical History:  Procedure Laterality Date  . BUNIONECTOMY    . CATARACT EXTRACTION Bilateral   . CHOLECYSTECTOMY  2003  . KNEE ARTHROSCOPY    . WRIST FRACTURE SURGERY     FHx:    Reviewed / unchanged  SHx:    Reviewed / unchanged  Systems Review:  Constitutional: Denies fever, chills, wt changes, headaches, insomnia, fatigue, night sweats, change in appetite. Eyes: Denies redness, blurred vision, diplopia, discharge, itchy, watery eyes.  ENT: Denies discharge, congestion, post nasal drip, epistaxis, sore throat, earache, hearing loss, dental pain, tinnitus, vertigo, sinus pain, snoring.  CV: Denies chest pain, palpitations, irregular heartbeat, syncope, dyspnea, diaphoresis, orthopnea, PND, claudication or edema. Respiratory: denies cough, dyspnea, DOE, pleurisy, hoarseness, laryngitis, wheezing.  Gastrointestinal: Denies dysphagia, odynophagia, heartburn, reflux, water brash,  abdominal pain or cramps, nausea, vomiting, bloating, diarrhea, constipation, hematemesis, melena, hematochezia  or hemorrhoids. Genitourinary: Denies dysuria, frequency, urgency, nocturia, hesitancy, discharge, hematuria or flank pain. Musculoskeletal: Denies arthralgias, myalgias, stiffness, jt. swelling, pain, limping or strain/sprain.  Skin: Denies pruritus, rash, hives, warts, acne, eczema or change in skin lesion(s). Neuro: No weakness, tremor, incoordination, spasms, paresthesia or pain. Psychiatric: Denies confusion, memory loss  or sensory loss. Endo: Denies change in weight, skin or hair change.  Heme/Lymph: No excessive bleeding, bruising or enlarged lymph nodes.  Physical Exam  BP 124/82   Pulse 76   Temp (!) 97.2 F (36.2 C)   Resp 16   Ht 5' 4.75" (1.645 m)   Wt 147 lb 9.6 oz (67 kg)   BMI 24.75 kg/m   Appears  well nourished, well groomed  and in no distress.  Eyes: PERRLA, EOMs, conjunctiva no swelling or erythema. Sinuses: No frontal/maxillary tenderness ENT/Mouth: EAC's clear, TM's nl w/o erythema, bulging. Nares clear w/o erythema, swelling, exudates. Oropharynx clear without erythema or exudates. Oral hygiene is good. Tongue normal, non obstructing. Hearing intact.  Neck: Supple. Thyroid not palpable. Car 2+/2+ without bruits, nodes or JVD. Chest: Respirations nl with BS clear & equal w/o rales, rhonchi, wheezing or stridor.  Cor: Heart sounds normal w/ regular rate and rhythm without sig. murmurs, gallops, clicks or rubs. Peripheral pulses normal and equal  without edema.  Abdomen: Soft & bowel sounds normal. Non-tender w/o guarding, rebound, hernias, masses or organomegaly.  Lymphatics: Unremarkable.  Musculoskeletal: Full ROM all peripheral extremities, joint stability, 5/5 strength and normal gait.  Skin: Warm, dry without exposed rashes, lesions or ecchymosis apparent.  Neuro: Cranial nerves intact, reflexes equal bilaterally. Sensory-motor testing grossly intact. Tendon reflexes grossly intact.  Pysch: Alert & oriented x 3.  Insight and judgement nl & appropriate. No ideations.  Assessment and Plan:  1. Labile hypertension  - Continue medication, monitor blood pressure at home.  - Continue DASH diet.  Reminder to go to the ER if any CP,  SOB, nausea, dizziness, severe HA, changes vision/speech.  - CBC with Differential/Platelet - COMPLETE METABOLIC PANEL WITH GFR - Magnesium - TSH  2. Hyperlipidemia, mixed  - Continue diet/meds, exercise,& lifestyle modifications.  - Continue  monitor periodic cholesterol/liver & renal functions   - Lipid panel  3. Abnormal glucose  - Continue diet, exercise, lifestyle modifications.  - Monitor appropriate labs.  - Hemoglobin A1c - Insulin, random  4. Vitamin D deficiency  - Continue supplementation.  - VITAMIN D 25 Hydroxyl  5. Prediabetes  - Hemoglobin A1c - Insulin, random  6. Gastroesophageal reflux disease  - CBC with Differential/Platelet  7. Medication management  - CBC with Differential/Platelet - COMPLETE METABOLIC PANEL WITH GFR - Magnesium - Lipid panel - TSH - Hemoglobin A1c - Insulin, random - VITAMIN D 25 Hydroxyl           Discussed  regular exercise, BP monitoring, weight control to achieve/maintain BMI less than 25 and discussed med and SE's. Recommended labs to assess and monitor clinical status with further disposition pending results of labs. Over 30 minutes of exam, counseling, chart review was performed.

## 2017-06-10 LAB — LIPID PANEL
Cholesterol: 241 mg/dL — ABNORMAL HIGH (ref <200)
HDL: 72 mg/dL (ref >50)
LDL Cholesterol (Calc): 138 mg/dL (calc) — ABNORMAL HIGH
Non-HDL Cholesterol (Calc): 169 mg/dL (calc) — ABNORMAL HIGH (ref <130)
Total CHOL/HDL Ratio: 3.3 (calc) (ref <5.0)
Triglycerides: 178 mg/dL — ABNORMAL HIGH (ref <150)

## 2017-06-10 LAB — CBC WITH DIFFERENTIAL/PLATELET
Basophils Absolute: 51 cells/uL (ref 0–200)
Basophils Relative: 0.9 %
Eosinophils Absolute: 0 cells/uL — ABNORMAL LOW (ref 15–500)
Eosinophils Relative: 0 %
HCT: 37.3 % (ref 35.0–45.0)
Hemoglobin: 11.8 g/dL (ref 11.7–15.5)
Lymphs Abs: 1340 cells/uL (ref 850–3900)
MCH: 24.1 pg — ABNORMAL LOW (ref 27.0–33.0)
MCHC: 31.6 g/dL — ABNORMAL LOW (ref 32.0–36.0)
MCV: 76.1 fL — ABNORMAL LOW (ref 80.0–100.0)
MPV: 10.6 fL (ref 7.5–12.5)
Monocytes Relative: 9.5 %
Neutro Abs: 3768 cells/uL (ref 1500–7800)
Neutrophils Relative %: 66.1 %
Platelets: 393 10*3/uL (ref 140–400)
RBC: 4.9 10*6/uL (ref 3.80–5.10)
RDW: 14.7 % (ref 11.0–15.0)
Total Lymphocyte: 23.5 %
WBC mixed population: 542 cells/uL (ref 200–950)
WBC: 5.7 10*3/uL (ref 3.8–10.8)

## 2017-06-10 LAB — HEMOGLOBIN A1C
EAG (MMOL/L): 6.6 (calc)
Hgb A1c MFr Bld: 5.8 % of total Hgb — ABNORMAL HIGH (ref <5.7)
MEAN PLASMA GLUCOSE: 120 (calc)

## 2017-06-10 LAB — COMPLETE METABOLIC PANEL WITH GFR
AG RATIO: 1.6 (calc) (ref 1.0–2.5)
ALKALINE PHOSPHATASE (APISO): 90 U/L (ref 33–130)
ALT: 13 U/L (ref 6–29)
AST: 14 U/L (ref 10–35)
Albumin: 4.4 g/dL (ref 3.6–5.1)
BILIRUBIN TOTAL: 0.3 mg/dL (ref 0.2–1.2)
BUN / CREAT RATIO: 22 (calc) (ref 6–22)
BUN: 13 mg/dL (ref 7–25)
CHLORIDE: 104 mmol/L (ref 98–110)
CO2: 28 mmol/L (ref 20–32)
Calcium: 9.3 mg/dL (ref 8.6–10.4)
Creat: 0.58 mg/dL — ABNORMAL LOW (ref 0.60–0.93)
GFR, Est African American: 102 mL/min/{1.73_m2} (ref >=60)
GFR, Est Non African American: 88 mL/min/{1.73_m2} (ref >=60)
GLUCOSE: 118 mg/dL — AB (ref 65–99)
Globulin: 2.7 g/dL (calc) (ref 1.9–3.7)
POTASSIUM: 4.1 mmol/L (ref 3.5–5.3)
Sodium: 140 mmol/L (ref 135–146)
TOTAL PROTEIN: 7.1 g/dL (ref 6.1–8.1)

## 2017-06-10 LAB — TSH: TSH: 2.36 mIU/L (ref 0.40–4.50)

## 2017-06-10 LAB — MAGNESIUM: Magnesium: 2.2 mg/dL (ref 1.5–2.5)

## 2017-06-10 LAB — INSULIN, RANDOM: INSULIN: 19.8 u[IU]/mL — AB (ref 2.0–19.6)

## 2017-06-10 LAB — VITAMIN D 25 HYDROXY (VIT D DEFICIENCY, FRACTURES): Vit D, 25-Hydroxy: 38 ng/mL (ref 30–100)

## 2017-06-11 ENCOUNTER — Encounter: Payer: Self-pay | Admitting: Internal Medicine

## 2017-09-08 NOTE — Progress Notes (Addendum)
Medicare wellness AND 3 MONTH FOLLOW  Assessment:    Labile hypertension - continue medications, DASH diet, exercise and monitor at home. Call if greater than 130/80.  -     CBC with Differential/Platelet -     COMPLETE METABOLIC PANEL WITH GFR -     TSH  Iron deficiency anemia, unspecified iron deficiency anemia type CHECK LABS, MAY NEED TO GET BACK ON INTEGRA/GET EGD AGAIN -     Ferritin -     Iron,Total/Total Iron Binding Cap  Hyperlipidemia, mixed check lipids decrease fatty foods increase activity.  -     Lipid panel  Medication management  Vitamin D deficiency  Encounter for Medicare annual wellness exam 1 YEAR  Chronic anxiety -continue medications, stress management techniques discussed, increase water, good sleep hygiene discussed, increase exercise, and increase veggies.   Abnormal glucose Discussed disease progression and risks Discussed diet/exercise, weight management and risk modification -     Hemoglobin A1c  Hx of adenomatous colonic polyps  Osteoporosis, unspecified osteoporosis type, unspecified pathological fracture presence continue Vit D and Ca, weight bearing exercises  Hiatal hernia  BMI 24.0-24.9, adult  CERUMEN IMPACTION WITH HEARING LOSS - stop using Qtips, irrigation used in the office without complications, use OTC drops/oil at home to prevent reoccurence     Future Appointments  Date Time Provider [place]  12/19/2017  3:00 PM Vicie Mutters, PA-C [id number] None    Plan:   During the course of the visit the patient was educated and counseled about appropriate screening and preventive services including:    Pneumococcal vaccine   Prevnar 13  Influenza vaccine  Td vaccine  Screening electrocardiogram  Bone densitometry screening  Colorectal cancer screening  Diabetes screening  Glaucoma screening  Nutrition counseling   Advanced directives: requested    Subjective:   Stephanie Roach is a 82  y.o. female who presents for medicare and follow up for chol, preDM, and HTN.    Her blood pressure has been controlled at home, today their BP is BP: 117/73 She does not workout.  She denies chest pain,dizziness.   She has been having GERD 2-3 x a day, she has SOB or feel that she is gasping for air while sitting once a week, no exertional SOB, no CP. 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 She has been off integra, was following with Dr. Julien Nordmann. Husband, Juanda Crumble diagnosed with parkinson's and he is declining very quickly. She states she worries a lot about him. She snores if she lies on her back. She is on effexor 150mg  a day which helps.   She is on cholesterol medication and denies myalgias. Her cholesterol is at goal. The cholesterol last visit was:   Lab Results  Component Value Date   CHOL 241 (H) 06/09/2017   HDL 72 06/09/2017   LDLCALC 138 (H) 06/09/2017   TRIG 178 (H) 06/09/2017   CHOLHDL 3.3 06/09/2017    She has been working on diet and exercise for prediabetes, and denies polydipsia, polyuria and visual disturbances. Last A1C in the office was:  Lab Results  Component Value Date   HGBA1C 5.8 (H) 06/09/2017   Patient is on Vitamin D supplement, 2000 IU.   Lab Results  Component Value Date   VD25OH 38 06/09/2017     Saw Dr. Berenice Primas for bilateral knee pain, got injections that helped.  BMI is Body mass index is 25.15 kg/m., she is working on diet and exercise. Wt Readings from Last 3 Encounters:  09/09/17 150 lb (68 kg)  06/09/17 147 lb 9.6 oz (67 kg)  12/06/16 145 lb 3.2 oz (65.9 kg)     Names of Other Physician/Practitioners you currently use: 1. [facility]- here for primary care 2. Dr. Katy Fitch, eye doctor, last visit 3 months 3. Dr. Osa Craver, dentist, last visit 6 months Patient Care Team: Unk Pinto,  MD as PCP - General (Internal Medicine) Lafayette Dragon, MD (Inactive) as Consulting Physician (Gastroenterology) Norma Fredrickson, MD as Consulting Physician (Psychiatry) Curt Bears, MD as Consulting Physician (Oncology) Berenice Primas, MD as Referring Physician (Orthopedic Surgery)   Medication Review Current Outpatient Medications on File Prior to Visit  Medication Sig Dispense Refill  . Cholecalciferol (VITAMIN D PO) Take 2,000 Units by mouth daily.    . Fe Fum-FePoly-FA-Vit C-Vit B3 (INTEGRA F) 125-1 MG CAPS Take 1 capsule by mouth daily. 90 capsule 1  . lansoprazole (PREVACID) 15 MG capsule Take by mouth.    Marland Kitchen LORazepam (ATIVAN) 1 MG tablet Take 1 mg by mouth every 8 (eight) hours.      Marland Kitchen OVER THE COUNTER MEDICATION Takes Tums 2 tablets daily    . traZODone (DESYREL) 50 MG tablet 50 mg at bedtime as needed.     . venlafaxine XR (EFFEXOR XR) 75 MG 24 hr capsule Take 3 capsules (225 mg total) daily by mouth. 270 capsule 1   No current facility-administered medications on file prior to visit.     Current Problems (verified) Patient Active Problem List   Diagnosis Date Noted  . Medication management 02/18/2015  . Hiatal hernia 02/18/2015  . Osteoporosis 09/17/2014  . Abnormal glucose   . Hyperlipidemia, mixed   . Labile hypertension   . Vitamin D deficiency   . Hx of adenomatous colonic polyps 08/11/2010  . Iron deficiency anemia 08/11/2010  . Chronic anxiety 08/11/2010    Screening Tests Immunization History  Administered Date(s) Administered  . Pneumococcal Conjugate-13 08/27/2013  . Pneumococcal Polysaccharide-23 10/23/2015  . Pneumococcal-Unspecified 12/06/2001  . Tdap 08/05/2012  . Zoster 02/05/2010    Preventative care: Last colonoscopy: 2015 EGD 2015 Last mammogram: 10/2016 Last pap smear/pelvic exam: 2012  DEXA: 09/2015 + osteoporosis off the fosamax- could not tolerate due to her stomach.   Prior vaccinations: TD or Tdap: 2014  Influenza:  declines Prevnar 13: 2015 Pneumococcal: 2017 Shingles/Zostavax: 2012  Allergies Allergies  Allergen Reactions  . Morphine And Related     SURGICAL HISTORY She  has a past surgical history that includes Cholecystectomy (2003); Knee arthroscopy; Bunionectomy; Wrist fracture surgery; and Cataract extraction (Bilateral). FAMILY HISTORY Her family history includes Breast cancer in her cousin; Colon cancer in her father; Colon polyps in her maternal aunt; Pancreatic cancer in her paternal aunt; Stroke in her mother; Ulcerative colitis in her brother. SOCIAL HISTORY She  reports that she has never smoked. She has never used smokeless tobacco. She reports that she does not drink alcohol or use drugs.  MEDICARE WELLNESS OBJECTIVES: Physical activity: Current Exercise Habits: The patient does not participate in regular exercise at present Cardiac risk factors: Cardiac Risk Factors include: advanced age (>71men, >7 women);dyslipidemia;hypertension Depression/mood screen:   Depression screen [facility] 2/9 09/09/2017  Decreased Interest 0  Down, Depressed, Hopeless 0  PHQ - 2 Score 0  Altered sleeping -  Tired, decreased energy -  Change in appetite -  Feeling  bad or failure about yourself  -  Trouble concentrating -  Moving slowly or fidgety/restless -  Suicidal thoughts -  PHQ-9 Score -  Difficult doing work/chores -    ADLs:  In your present state of health, do you have any difficulty performing the following activities: 09/09/2017 06/11/2017  Hearing? N N  Vision? N N  Difficulty concentrating or making decisions? N N  Walking or climbing stairs? N N  Dressing or bathing? N N  Doing errands, shopping? N N  Some recent data might be hidden     Cognitive Testing  Alert? Yes  Normal Appearance?Yes  Oriented to person? Yes  Place? Yes   Time? Yes  Recall of three objects?  Yes  Can perform simple calculations? Yes  Displays appropriate judgment?Yes  Can read the correct time from a  watch face?Yes  EOL planning: Does Patient Have a Medical Advance Directive?: Yes Type of Advance Directive: Healthcare Power of Attorney, Living will [place] in Chart?: No - copy requested    Review of Systems  Constitutional: Positive for malaise/fatigue. Negative for chills, diaphoresis, fever and weight loss.  HENT: Negative.   Eyes: Negative.   Respiratory: Negative.   Cardiovascular: Negative.   Gastrointestinal: Negative.   Genitourinary: Negative.   Musculoskeletal: Negative.   Skin: Negative.   Neurological: Negative.  Negative for weakness.  Psychiatric/Behavioral: Negative.      Objective:     Blood pressure 117/73, pulse 86, temperature 98.3 F (36.8 C), resp. rate 16, height 5' 4.75" (1.645 m), weight 150 lb (68 kg), SpO2 98 %. Body mass index is 25.15 kg/m.  General appearance: alert, no distress, WD/WN,  female HEENT: normocephalic, sclerae anicteric, TMs pearly, nares patent, no discharge or erythema, pharynx normal Oral cavity: MMM, no lesions Neck: supple, no lymphadenopathy, no thyromegaly, no masses Heart: RRR, normal S1, S2, no murmurs Lungs: CTA bilaterally, no wheezes, rhonchi, or rales Abdomen: +bs, soft, non tender, non distended, no masses, no hepatomegaly, no splenomegaly Musculoskeletal: nontender, no swelling, no obvious deformity Extremities: no edema, no cyanosis, no clubbing Pulses: 2+ symmetric, upper and lower extremities, normal cap refill Neurological: alert, oriented x 3, CN2-12 intact, strength normal upper extremities and lower extremities, sensation normal throughout, DTRs 2+ throughout, no cerebellar signs, gait normal Psychiatric: normal affect, behavior normal, pleasant  Breast:   breasts appear normal, no suspicious masses, no skin or nipple changes or axillary nodes. Gyn: defer  Rectal: defer   Medicare Attestation I have personally reviewed: The patient's medical and social history Their use  of alcohol, tobacco or illicit drugs Their current medications and supplements The patient's functional ability including ADLs,fall risks, home safety risks, cognitive, and hearing and visual impairment Diet and physical activities Evidence for depression or mood disorders  The patient's weight, height, BMI, and visual acuity have been recorded in the chart.  I have made referrals, counseling, and provided education to the patient based on review of the above and I have provided the patient with a written personalized care plan for preventive services.     Vicie Mutters, PA-C   09/09/2017

## 2017-09-08 NOTE — Patient Instructions (Addendum)
If you have been in the ER, hospital, or long term care facility, we want to see you within one week of discharge so please CONTACT OUR OFFICE at [phone].  We will try to contact you if we know about your admission/visit but we would love for you to contact us first.  We know your history and want to make sure all of your questions are answered and needs are taken care of after admission.   We also want our patients to know that we do NOT approve of [facility] soliciting our patients to do home visits and we do NOT approve of [facility] screenings. Please contact us before doing either of these "services".   Try 1/2 of trazodone for sleep at night   START ON PREVACID DAILY  Suggest following back up with GI doctor IF YOU ARE NOT GETTING BETTER Phone: [phone]; [place]

## 2017-09-09 ENCOUNTER — Encounter: Payer: Self-pay | Admitting: Physician Assistant

## 2017-09-09 ENCOUNTER — Ambulatory Visit (INDEPENDENT_AMBULATORY_CARE_PROVIDER_SITE_OTHER): Payer: Medicare Other | Admitting: Physician Assistant

## 2017-09-09 VITALS — BP 117/73 | HR 86 | Temp 98.3°F | Resp 16 | Ht 64.75 in | Wt 150.0 lb

## 2017-09-09 DIAGNOSIS — H9192 Unspecified hearing loss, left ear: Secondary | ICD-10-CM | POA: Diagnosis not present

## 2017-09-09 DIAGNOSIS — Z6824 Body mass index (BMI) 24.0-24.9, adult: Secondary | ICD-10-CM

## 2017-09-09 DIAGNOSIS — R7309 Other abnormal glucose: Secondary | ICD-10-CM | POA: Diagnosis not present

## 2017-09-09 DIAGNOSIS — E782 Mixed hyperlipidemia: Secondary | ICD-10-CM | POA: Diagnosis not present

## 2017-09-09 DIAGNOSIS — H6122 Impacted cerumen, left ear: Secondary | ICD-10-CM

## 2017-09-09 DIAGNOSIS — Z79899 Other long term (current) drug therapy: Secondary | ICD-10-CM

## 2017-09-09 DIAGNOSIS — R0989 Other specified symptoms and signs involving the circulatory and respiratory systems: Secondary | ICD-10-CM | POA: Diagnosis not present

## 2017-09-09 DIAGNOSIS — R6889 Other general symptoms and signs: Secondary | ICD-10-CM | POA: Diagnosis not present

## 2017-09-09 DIAGNOSIS — Z86010 Personal history of colonic polyps: Secondary | ICD-10-CM

## 2017-09-09 DIAGNOSIS — M810 Age-related osteoporosis without current pathological fracture: Secondary | ICD-10-CM

## 2017-09-09 DIAGNOSIS — Z0000 Encounter for general adult medical examination without abnormal findings: Secondary | ICD-10-CM

## 2017-09-09 DIAGNOSIS — E559 Vitamin D deficiency, unspecified: Secondary | ICD-10-CM

## 2017-09-09 DIAGNOSIS — D509 Iron deficiency anemia, unspecified: Secondary | ICD-10-CM | POA: Diagnosis not present

## 2017-09-09 DIAGNOSIS — Z0001 Encounter for general adult medical examination with abnormal findings: Secondary | ICD-10-CM | POA: Diagnosis not present

## 2017-09-09 DIAGNOSIS — K449 Diaphragmatic hernia without obstruction or gangrene: Secondary | ICD-10-CM

## 2017-09-09 DIAGNOSIS — F419 Anxiety disorder, unspecified: Secondary | ICD-10-CM

## 2017-09-09 NOTE — Addendum Note (Signed)
Addended by: Vicie Mutters R on: 09/09/2017 12:24 PM   Modules accepted: Level of Service

## 2017-09-10 LAB — LIPID PANEL
CHOLESTEROL: 232 mg/dL — AB (ref <200)
HDL: 65 mg/dL (ref >50)
LDL Cholesterol (Calc): 143 mg/dL (calc) — ABNORMAL HIGH
Non-HDL Cholesterol (Calc): 167 mg/dL (calc) — ABNORMAL HIGH (ref <130)
Total CHOL/HDL Ratio: 3.6 (calc) (ref <5.0)
Triglycerides: 122 mg/dL (ref <150)

## 2017-09-10 LAB — TSH: TSH: 2.77 m[IU]/L (ref 0.40–4.50)

## 2017-09-10 LAB — CBC WITH DIFFERENTIAL/PLATELET
BASOS PCT: 0.6 %
Basophils Absolute: 32 cells/uL (ref 0–200)
EOS ABS: 38 {cells}/uL (ref 15–500)
Eosinophils Relative: 0.7 %
HCT: 28.2 % — ABNORMAL LOW (ref 35.0–45.0)
Hemoglobin: 8.4 g/dL — ABNORMAL LOW (ref 11.7–15.5)
Lymphs Abs: 1361 cells/uL (ref 850–3900)
MCH: 20.9 pg — AB (ref 27.0–33.0)
MCHC: 29.8 g/dL — AB (ref 32.0–36.0)
MCV: 70.3 fL — ABNORMAL LOW (ref 80.0–100.0)
MONOS PCT: 9 %
MPV: 10.5 fL (ref 7.5–12.5)
Neutro Abs: 3483 cells/uL (ref 1500–7800)
Neutrophils Relative %: 64.5 %
PLATELETS: 408 10*3/uL — AB (ref 140–400)
RBC: 4.01 10*6/uL (ref 3.80–5.10)
RDW: 14.7 % (ref 11.0–15.0)
TOTAL LYMPHOCYTE: 25.2 %
WBC mixed population: 486 cells/uL (ref 200–950)
WBC: 5.4 10*3/uL (ref 3.8–10.8)

## 2017-09-10 LAB — COMPLETE METABOLIC PANEL WITH GFR
AG RATIO: 1.5 (calc) (ref 1.0–2.5)
ALKALINE PHOSPHATASE (APISO): 108 U/L (ref 33–130)
ALT: 13 U/L (ref 6–29)
AST: 16 U/L (ref 10–35)
Albumin: 4.2 g/dL (ref 3.6–5.1)
BILIRUBIN TOTAL: 0.3 mg/dL (ref 0.2–1.2)
BUN: 15 mg/dL (ref 7–25)
CHLORIDE: 104 mmol/L (ref 98–110)
CO2: 22 mmol/L (ref 20–32)
Calcium: 8.8 mg/dL (ref 8.6–10.4)
Creat: 0.64 mg/dL (ref 0.60–0.93)
GFR, EST AFRICAN AMERICAN: 98 mL/min/{1.73_m2} (ref >=60)
GFR, Est Non African American: 85 mL/min/{1.73_m2} (ref >=60)
GLUCOSE: 100 mg/dL — AB (ref 65–99)
Globulin: 2.8 g/dL (calc) (ref 1.9–3.7)
POTASSIUM: 4.1 mmol/L (ref 3.5–5.3)
Sodium: 137 mmol/L (ref 135–146)
TOTAL PROTEIN: 7 g/dL (ref 6.1–8.1)

## 2017-09-10 LAB — IRON, TOTAL/TOTAL IRON BINDING CAP
%SAT: 3 % — AB (ref 16–45)
IRON: 16 ug/dL — AB (ref 45–160)
TIBC: 534 mcg/dL (calc) — ABNORMAL HIGH (ref 250–450)

## 2017-09-10 LAB — HEMOGLOBIN A1C
HEMOGLOBIN A1C: 6 %{Hb} — AB (ref <5.7)
MEAN PLASMA GLUCOSE: 126 (calc)
eAG (mmol/L): 7 (calc)

## 2017-09-10 LAB — FERRITIN: FERRITIN: 5 ng/mL — AB (ref 16–288)

## 2017-09-10 NOTE — Addendum Note (Signed)
Addended by: Vicie Mutters R on: 09/10/2017 03:05 PM   Modules accepted: Orders

## 2017-09-13 ENCOUNTER — Other Ambulatory Visit: Payer: Medicare Other

## 2017-09-13 ENCOUNTER — Telehealth: Payer: Self-pay | Admitting: *Deleted

## 2017-09-13 DIAGNOSIS — D509 Iron deficiency anemia, unspecified: Secondary | ICD-10-CM | POA: Diagnosis not present

## 2017-09-13 LAB — CBC WITH DIFFERENTIAL/PLATELET
Basophils Absolute: 28 cells/uL (ref 0–200)
Basophils Relative: 0.6 %
Eosinophils Absolute: 38 cells/uL (ref 15–500)
Eosinophils Relative: 0.8 %
HEMATOCRIT: 26.5 % — AB (ref 35.0–45.0)
HEMOGLOBIN: 8 g/dL — AB (ref 11.7–15.5)
LYMPHS ABS: 1142 {cells}/uL (ref 850–3900)
MCH: 21.1 pg — ABNORMAL LOW (ref 27.0–33.0)
MCHC: 30.2 g/dL — AB (ref 32.0–36.0)
MCV: 69.7 fL — ABNORMAL LOW (ref 80.0–100.0)
MPV: 10.3 fL (ref 7.5–12.5)
Monocytes Relative: 8.2 %
NEUTROS PCT: 66.1 %
Neutro Abs: 3107 cells/uL (ref 1500–7800)
Platelets: 351 10*3/uL (ref 140–400)
RBC: 3.8 10*6/uL (ref 3.80–5.10)
RDW: 15 % (ref 11.0–15.0)
Total Lymphocyte: 24.3 %
WBC: 4.7 10*3/uL (ref 3.8–10.8)
WBCMIX: 385 {cells}/uL (ref 200–950)

## 2017-09-13 NOTE — Telephone Encounter (Signed)
Patient called asking for a call about labs done today.

## 2017-09-14 ENCOUNTER — Other Ambulatory Visit: Payer: Self-pay | Admitting: Physician Assistant

## 2017-09-14 DIAGNOSIS — D649 Anemia, unspecified: Secondary | ICD-10-CM

## 2017-09-14 DIAGNOSIS — D509 Iron deficiency anemia, unspecified: Secondary | ICD-10-CM

## 2017-09-15 ENCOUNTER — Other Ambulatory Visit: Payer: Medicare Other

## 2017-09-15 DIAGNOSIS — R0989 Other specified symptoms and signs involving the circulatory and respiratory systems: Secondary | ICD-10-CM | POA: Diagnosis not present

## 2017-09-15 DIAGNOSIS — D649 Anemia, unspecified: Secondary | ICD-10-CM | POA: Diagnosis not present

## 2017-09-15 DIAGNOSIS — D509 Iron deficiency anemia, unspecified: Secondary | ICD-10-CM

## 2017-09-15 DIAGNOSIS — E538 Deficiency of other specified B group vitamins: Secondary | ICD-10-CM | POA: Diagnosis not present

## 2017-09-15 DIAGNOSIS — R5383 Other fatigue: Secondary | ICD-10-CM | POA: Diagnosis not present

## 2017-09-15 LAB — CBC WITH DIFFERENTIAL/PLATELET
BASOS ABS: 28 {cells}/uL (ref 0–200)
Basophils Relative: 0.6 %
EOS PCT: 1.5 %
Eosinophils Absolute: 71 cells/uL (ref 15–500)
HEMATOCRIT: 27 % — AB (ref 35.0–45.0)
HEMOGLOBIN: 8.1 g/dL — AB (ref 11.7–15.5)
LYMPHS ABS: 1095 {cells}/uL (ref 850–3900)
MCH: 21 pg — ABNORMAL LOW (ref 27.0–33.0)
MCHC: 30 g/dL — AB (ref 32.0–36.0)
MCV: 69.9 fL — ABNORMAL LOW (ref 80.0–100.0)
MPV: 10.7 fL (ref 7.5–12.5)
Monocytes Relative: 8.9 %
NEUTROS ABS: 3088 {cells}/uL (ref 1500–7800)
Neutrophils Relative %: 65.7 %
Platelets: 350 10*3/uL (ref 140–400)
RBC: 3.86 10*6/uL (ref 3.80–5.10)
RDW: 16.4 % — ABNORMAL HIGH (ref 11.0–15.0)
Total Lymphocyte: 23.3 %
WBC mixed population: 418 cells/uL (ref 200–950)
WBC: 4.7 10*3/uL (ref 3.8–10.8)

## 2017-09-15 LAB — IRON, TOTAL/TOTAL IRON BINDING CAP
%SAT: 25 % (ref 16–45)
Iron: 123 ug/dL (ref 45–160)
TIBC: 485 mcg/dL (calc) — ABNORMAL HIGH (ref 250–450)

## 2017-09-15 LAB — VITAMIN B12: Vitamin B-12: 297 pg/mL (ref 200–1100)

## 2017-09-15 LAB — CBC MORPHOLOGY

## 2017-09-15 LAB — RETICULOCYTES
ABS Retic: 123520 cells/uL — ABNORMAL HIGH (ref 20000–8000)
Retic Ct Pct: 3.2 %

## 2017-09-15 LAB — FOLATE RBC: RBC FOLATE: 1240 ng/mL (ref >280)

## 2017-09-15 LAB — FERRITIN: Ferritin: 29 ng/mL (ref 16–288)

## 2017-09-15 NOTE — Addendum Note (Signed)
Addended by: Vicie Mutters R on: 09/15/2017 04:20 PM   Modules accepted: Orders

## 2017-09-19 ENCOUNTER — Telehealth: Payer: Self-pay | Admitting: Internal Medicine

## 2017-09-19 NOTE — Telephone Encounter (Signed)
Scheduled appt per 8/21 sch message - pt aware of appt date and time   

## 2017-09-20 ENCOUNTER — Other Ambulatory Visit: Payer: Self-pay | Admitting: Medical Oncology

## 2017-09-20 DIAGNOSIS — D509 Iron deficiency anemia, unspecified: Secondary | ICD-10-CM

## 2017-09-21 ENCOUNTER — Telehealth: Payer: Self-pay

## 2017-09-21 ENCOUNTER — Inpatient Hospital Stay: Payer: Medicare Other | Attending: Internal Medicine | Admitting: Internal Medicine

## 2017-09-21 ENCOUNTER — Encounter: Payer: Self-pay | Admitting: Internal Medicine

## 2017-09-21 ENCOUNTER — Inpatient Hospital Stay: Payer: Medicare Other

## 2017-09-21 VITALS — BP 143/85 | HR 88 | Temp 98.0°F | Resp 18 | Ht 64.0 in | Wt 149.1 lb

## 2017-09-21 DIAGNOSIS — D509 Iron deficiency anemia, unspecified: Secondary | ICD-10-CM

## 2017-09-21 LAB — CMP (CANCER CENTER ONLY)
ALBUMIN: 3.7 g/dL (ref 3.5–5.0)
ALK PHOS: 104 U/L (ref 38–126)
ALT: 9 U/L (ref 0–44)
AST: 13 U/L — AB (ref 15–41)
Anion gap: 7 (ref 5–15)
BUN: 10 mg/dL (ref 8–23)
CALCIUM: 9.4 mg/dL (ref 8.9–10.3)
CO2: 26 mmol/L (ref 22–32)
CREATININE: 0.78 mg/dL (ref 0.44–1.00)
Chloride: 109 mmol/L (ref 98–111)
GFR, Est AFR Am: >60 mL/min (ref >60)
GFR, Estimated: >60 mL/min (ref >60)
GLUCOSE: 104 mg/dL — AB (ref 70–99)
Potassium: 3.9 mmol/L (ref 3.5–5.1)
SODIUM: 142 mmol/L (ref 135–145)
Total Bilirubin: 0.2 mg/dL — ABNORMAL LOW (ref 0.3–1.2)
Total Protein: 7 g/dL (ref 6.5–8.1)

## 2017-09-21 LAB — CBC WITH DIFFERENTIAL (CANCER CENTER ONLY)
Basophils Absolute: 0 10*3/uL (ref 0.0–0.1)
Basophils Relative: 1 %
EOS PCT: 0 %
Eosinophils Absolute: 0 10*3/uL (ref 0.0–0.5)
HEMATOCRIT: 30.1 % — AB (ref 34.8–46.6)
Hemoglobin: 9.2 g/dL — ABNORMAL LOW (ref 11.6–15.9)
LYMPHS PCT: 25 %
Lymphs Abs: 1.1 10*3/uL (ref 0.9–3.3)
MCH: 22.1 pg — AB (ref 25.1–34.0)
MCHC: 30.5 g/dL — AB (ref 31.5–36.0)
MCV: 72.5 fL — AB (ref 79.5–101.0)
MONO ABS: 0.4 10*3/uL (ref 0.1–0.9)
Monocytes Relative: 9 %
NEUTROS ABS: 2.9 10*3/uL (ref 1.5–6.5)
Neutrophils Relative %: 65 %
Platelet Count: 310 10*3/uL (ref 145–400)
RBC: 4.15 MIL/uL (ref 3.70–5.45)
RDW: 21.8 % — AB (ref 11.2–14.5)
WBC Count: 4.5 10*3/uL (ref 3.9–10.3)

## 2017-09-21 NOTE — Telephone Encounter (Signed)
Printed  avs and calender of upcoming appointment. Per 8/28 los 

## 2017-09-21 NOTE — Progress Notes (Signed)
[place] [phone]   [phone]) [phone]  OFFICE PROGRESS NOTE  Unk Pinto, MD [address] [place] [address]  DIAGNOSIS: Iron deficiency anemia  PRIOR THERAPY: None  CURRENT THERAPY: Integra +1 capsule by mouth daily.  INTERVAL HISTORY: Stephanie Roach 82 y.o. female returns to the clinic today for follow-up visit accompanied by her husband.  The patient was last seen in 2016.  She was started on treatment with Integra plus for the iron deficiency anemia.  She was doing fine but she decided to stop her treatment with the iron supplements on her own because of upset stomach.  She was seen recently by her primary care physician and found to have significant iron deficiency anemia.  The patient was started again on Integra +2 weeks ago.  She is feeling a little bit better.  She is here today for reevaluation and recommendation regarding her condition.  She has no nausea, vomiting, diarrhea or constipation.  She denied having any chest pain, shortness breath, cough or hemoptysis.  She has no recent weight loss or night sweats.  MEDICAL HISTORY: Past Medical History:  Diagnosis Date  . Allergic rhinitis   . Anemia   . Anxiety   . Arthritis   . Depression   . GERD (gastroesophageal reflux disease)   . Hyperlipidemia   . Hypertension   . Migraine   . Prediabetes   . Sleep apnea   . Vitamin D deficiency     ALLERGIES:  is allergic to morphine and related.  MEDICATIONS:  Current Outpatient Medications  Medication Sig Dispense Refill  . Cholecalciferol (VITAMIN D PO) Take 2,000 Units by mouth daily.    . Fe Fum-FePoly-FA-Vit C-Vit B3 (INTEGRA F) 125-1 MG CAPS Take 1 capsule by mouth daily. 90 capsule 1  . lansoprazole (PREVACID) 15 MG capsule Take by mouth.    Marland Kitchen LORazepam (ATIVAN) 1 MG tablet Take 1 mg by mouth every 8 (eight) hours.      Marland Kitchen OVER THE COUNTER MEDICATION Takes Tums 2 tablets daily    . traZODone  (DESYREL) 50 MG tablet 50 mg at bedtime as needed.     . venlafaxine XR (EFFEXOR XR) 75 MG 24 hr capsule Take 3 capsules (225 mg total) daily by mouth. 270 capsule 1   No current facility-administered medications for this visit.     SURGICAL HISTORY:  Past Surgical History:  Procedure Laterality Date  . BUNIONECTOMY    . CATARACT EXTRACTION Bilateral   . CHOLECYSTECTOMY  2003  . KNEE ARTHROSCOPY    . WRIST FRACTURE SURGERY      REVIEW OF SYSTEMS:  A comprehensive review of systems was negative except for: Constitutional: positive for fatigue   PHYSICAL EXAMINATION: General appearance: alert, cooperative and no distress Head: Normocephalic, without obvious abnormality, atraumatic Neck: no adenopathy, no JVD, supple, symmetrical, trachea midline and thyroid not enlarged, symmetric, no tenderness/mass/nodules Lymph nodes: Cervical, supraclavicular, and axillary nodes normal. Resp: clear to auscultation bilaterally Back: symmetric, no curvature. ROM normal. No CVA tenderness. Cardio: regular rate and rhythm, S1, S2 normal, no murmur, click, rub or gallop GI: soft, non-tender; bowel sounds normal; no masses,  no organomegaly Extremities: extremities normal, atraumatic, no cyanosis or edema  ECOG PERFORMANCE STATUS: 1 - Symptomatic but completely ambulatory  Blood pressure (!) 143/85, pulse 88, temperature 98 F (36.7 C), temperature source Oral, resp. rate 18, height 5\' 4"  (1.626 m), weight 149 lb 1.6 oz (67.6 kg), SpO2  100 %.  LABORATORY DATA: Lab Results  Component Value Date   WBC 4.5 09/21/2017   HGB 9.2 (L) 09/21/2017   HCT 30.1 (L) 09/21/2017   MCV 72.5 (L) 09/21/2017   PLT 310 09/21/2017      Chemistry      Component Value Date/Time   NA 137 09/09/2017 1203   NA 141 09/25/2013 1103   K 4.1 09/09/2017 1203   K 3.7 09/25/2013 1103   CL 104 09/09/2017 1203   CO2 22 09/09/2017 1203   CO2 22 09/25/2013 1103   BUN 15 09/09/2017 1203   BUN 14.8 09/25/2013 1103    CREATININE 0.64 09/09/2017 1203   CREATININE 0.7 09/25/2013 1103      Component Value Date/Time   CALCIUM 8.8 09/09/2017 1203   CALCIUM 9.1 09/25/2013 1103   ALKPHOS 95 08/16/2016 1125   ALKPHOS 79 09/25/2013 1103   AST 16 09/09/2017 1203   AST 14 09/25/2013 1103   ALT 13 09/09/2017 1203   ALT 11 09/25/2013 1103   BILITOT 0.3 09/09/2017 1203   BILITOT <0.20 09/25/2013 1103       RADIOGRAPHIC STUDIES:  ASSESSMENT AND PLAN: This is a very pleasant 82 years old white female with iron deficiency anemia. She was treated in the past with Integra plus with improvement of her anemia but the patient stopped her treatment after a while.  She was found recently to have iron deficiency anemia again. She was started on Integra plus by her primary care physician for the last 2 weeks and her CBC today showed improvement of her hemoglobin and hematocrit. I recommended for the patient to continue her current treatment with Integra plus.  I will see her back for follow-up visit in 2 months for reevaluation with repeat CBC, iron study and ferritin. I may consider The patient for treatment with Feraheme infusion if she has intolerance to the oral iron tablets or persistent anemia despite her treatment. She was advised to call immediately if she has any concerning symptoms in the interval. The patient voices understanding of current disease status and treatment options and is in agreement with the current care plan.  All questions were answered. The patient knows to call the clinic with any problems, questions or concerns. We can certainly see the patient much sooner if necessary.  Disclaimer: This note was dictated with voice recognition software. Similar sounding words can inadvertently be transcribed and may not be corrected upon review.

## 2017-09-22 ENCOUNTER — Other Ambulatory Visit: Payer: Self-pay

## 2017-09-28 ENCOUNTER — Other Ambulatory Visit: Payer: Self-pay | Admitting: Internal Medicine

## 2017-09-28 DIAGNOSIS — Z1231 Encounter for screening mammogram for malignant neoplasm of breast: Secondary | ICD-10-CM

## 2017-10-04 ENCOUNTER — Ambulatory Visit: Payer: Medicare Other | Admitting: Physician Assistant

## 2017-10-04 ENCOUNTER — Encounter: Payer: Self-pay | Admitting: Physician Assistant

## 2017-10-04 ENCOUNTER — Encounter

## 2017-10-04 VITALS — BP 108/60 | HR 60 | Ht 64.0 in | Wt 147.0 lb

## 2017-10-04 DIAGNOSIS — D509 Iron deficiency anemia, unspecified: Secondary | ICD-10-CM | POA: Diagnosis not present

## 2017-10-04 DIAGNOSIS — R12 Heartburn: Secondary | ICD-10-CM | POA: Diagnosis not present

## 2017-10-04 DIAGNOSIS — R1013 Epigastric pain: Secondary | ICD-10-CM | POA: Diagnosis not present

## 2017-10-04 MED ORDER — LANSOPRAZOLE 30 MG PO CPDR: 30.0000 mg | DELAYED_RELEASE_CAPSULE | Freq: Every day | ORAL | 5 refills | Status: DC

## 2017-10-04 NOTE — Progress Notes (Signed)
Chief Complaint: Iron deficiency anemia  HPI:    [name] is a 82 year old Caucasian female who previously followed with Dr. Olevia Perches for her iron deficiency anemia and was referred to me by Unk Pinto, MD for a complaint of iron deficiency anemia.      10/15/2013 office visit with Nicoletta Ba, PA to discuss iron deficiency anemia.  At that time it was discussed that patient also follows with Dr. Earlie Server for iron deficiency anemia and he felt this was likely nutritional.  Family history of colon cancer in her father, brother and 2 aunts also describes some intermittent burning in her epigastrium.  She had a personal history of tubular adenomatous polyps in 2002, her exam in 2010 was normal.  She was restarted on Omeprazole 40 mg daily and scheduled for an EGD and colonoscopy.    11/21/2013 EGD revealed presbyesophagus and a moderate sized hiatal hernia with Lysbeth Galas erosions.  Colonoscopy on that day revealed mild diverticulosis in the sigmoid colon.  It was suspected her chronic GI blood loss was from her upper GI tract and likely Cameron erosions.    09/21/2017 office visit with Dr. Earlie Server.  Patient is maintained on Integra daily.  Apparently was doing well but decided to stop her treatment with iron supplements on her own because of her upset stomach.  She was then seen by her primary care provider and found to have significant iron deficiency anemia.  She was started back on Integra about 2 weeks at time of her office visit and was feeling better.    09/21/2017 CBC showed hemoglobin of 9.2 this had improved from 8.0 on 09/13/2017.    Today, patient explains that her hemoglobin had gone up towards 11 and so she thought she did not you need to use her iron supplementation anymore, "because they are strong and kind of hard on my stomach", so she stopped this for about 3 months and at recheck her hemoglobin was around 8.  She went back to see Dr. Earlie Server who restarted her on her iron and she has  started seeing an increase in her hemoglobin as above.  Patient does tell me that she is having a generalized burning of her abdomen all over and heartburn.  She has not been using her Prevacid and instead is using Tums as needed.  Denies seeing any bright red blood or melena.    Patient does tell me that she is nervous in regards to any repeat procedures due to her age. Apparently had a friend who had perforation.    Denies fever, chills, weight loss, change in bowel habits, anorexia, nausea, vomiting or symptoms that awaken her from sleep.  Past Medical History:  Diagnosis Date  . Allergic rhinitis   . Anemia   . Anxiety   . Arthritis   . Depression   . GERD (gastroesophageal reflux disease)   . Hyperlipidemia   . Hypertension   . Migraine   . Prediabetes   . Sleep apnea   . Vitamin D deficiency     Past Surgical History:  Procedure Laterality Date  . BUNIONECTOMY    . CATARACT EXTRACTION Bilateral   . CHOLECYSTECTOMY  2003  . KNEE ARTHROSCOPY    . WRIST FRACTURE SURGERY      Current Outpatient Medications  Medication Sig Dispense Refill  . Cholecalciferol (VITAMIN D PO) Take 2,000 Units by mouth daily.    . Fe Fum-FePoly-FA-Vit C-Vit B3 (INTEGRA F) 125-1 MG CAPS Take 1 capsule by mouth daily.  90 capsule 1  . lansoprazole (PREVACID) 15 MG capsule Take by mouth.    Marland Kitchen LORazepam (ATIVAN) 1 MG tablet Take 1 mg by mouth every 8 (eight) hours.      Marland Kitchen OVER THE COUNTER MEDICATION Takes Tums 2 tablets daily    . traZODone (DESYREL) 50 MG tablet 50 mg at bedtime as needed.     . venlafaxine XR (EFFEXOR XR) 75 MG 24 hr capsule Take 3 capsules (225 mg total) daily by mouth. 270 capsule 1   No current facility-administered medications for this visit.     Allergies as of 10/04/2017 - Review Complete 10/04/2017  Allergen Reaction Noted  . Morphine and related  08/06/2010    Family History  Problem Relation Age of Onset  . Colon cancer Father   . Stroke Mother   . Breast cancer  Cousin   . Pancreatic cancer Paternal Aunt   . Ulcerative colitis Brother   . Colon polyps Maternal Aunt     Social History   Socioeconomic History  . Marital status: Married    Spouse name: Not on file  . Number of children: 3  . Years of education: Not on file  . Highest education level: Not on file  Occupational History  . Occupation: Retired  Scientific laboratory technician  . Financial resource strain: Not on file  . Food insecurity:    Worry: Not on file    Inability: Not on file  . Transportation needs:    Medical: Not on file    Non-medical: Not on file  Tobacco Use  . Smoking status: Never Smoker  . Smokeless tobacco: Never Used  Substance and Sexual Activity  . Alcohol use: No  . Drug use: No  . Sexual activity: Not on file  Lifestyle  . Physical activity:    Days per week: Not on file    Minutes per session: Not on file  . Stress: Not on file  Relationships  . Social connections:    Talks on phone: Not on file    Gets together: Not on file    Attends religious service: Not on file    Active member of club or organization: Not on file    Attends meetings of clubs or organizations: Not on file    Relationship status: Not on file  . Intimate partner violence:    Fear of current or ex partner: Not on file    Emotionally abused: Not on file    Physically abused: Not on file    Forced sexual activity: Not on file  Other Topics Concern  . Not on file  Social History Narrative   Jehovah's Witness- NO BLOOD PRODUCTS    Review of Systems:    Constitutional: No weight loss, fever or chills Skin: No rash  Cardiovascular: No chest pain Respiratory: No SOB  Gastrointestinal: See HPI and otherwise negative Genitourinary: No dysuria Neurological: No headache, dizziness or syncope Musculoskeletal: No new muscle or joint pain Hematologic: No bleeding or bruising Psychiatric: No history of depression or anxiety   Physical Exam:  Vital signs: BP 108/60   Pulse 60   Ht 5\' 4"   (1.626 m)   Wt 147 lb (66.7 kg)   BMI 25.23 kg/m   Constitutional:   Pleasant Elderly Caucasian female appears to be in NAD, Well developed, Well nourished, alert and cooperative Respiratory: Respirations even and unlabored. Lungs clear to auscultation bilaterally.   No wheezes, crackles, or rhonchi.  Cardiovascular: Normal S1, S2. No MRG. Regular  rate and rhythm. No peripheral edema, cyanosis or pallor.  Gastrointestinal:  Soft, nondistended, mild generalized ttp, some worse in epigastrum, No rebound or guarding. Normal bowel sounds. No appreciable masses or hepatomegaly. Rectal:  Not performed.  Psychiatric: Demonstrates good judgement and reason without abnormal affect or behaviors.  RELEVANT LABS AND IMAGING: CBC    Component Value Date/Time   WBC 4.5 09/21/2017 0922   WBC 4.7 09/15/2017 1001   RBC 4.15 09/21/2017 0922   HGB 9.2 (L) 09/21/2017 0922   HGB 12.3 02/04/2014 1111   HCT 30.1 (L) 09/21/2017 0922   HCT 38.7 02/04/2014 1111   PLT 310 09/21/2017 0922   PLT 288 02/04/2014 1111   MCV 72.5 (L) 09/21/2017 0922   MCV 83.9 02/04/2014 1111   MCH 22.1 (L) 09/21/2017 0922   MCHC 30.5 (L) 09/21/2017 0922   RDW 21.8 (H) 09/21/2017 0922   RDW 14.4 02/04/2014 1111   LYMPHSABS 1.1 09/21/2017 0922   LYMPHSABS 1.2 02/04/2014 1111   MONOABS 0.4 09/21/2017 0922   MONOABS 0.6 02/04/2014 1111   EOSABS 0.0 09/21/2017 0922   EOSABS 0.0 02/04/2014 1111   BASOSABS 0.0 09/21/2017 0922   BASOSABS 0.0 02/04/2014 1111    CMP     Component Value Date/Time   NA 142 09/21/2017 0922   NA 141 09/25/2013 1103   K 3.9 09/21/2017 0922   K 3.7 09/25/2013 1103   CL 109 09/21/2017 0922   CO2 26 09/21/2017 0922   CO2 22 09/25/2013 1103   GLUCOSE 104 (H) 09/21/2017 0922   GLUCOSE 105 09/25/2013 1103   BUN 10 09/21/2017 0922   BUN 14.8 09/25/2013 1103   CREATININE 0.78 09/21/2017 0922   CREATININE 0.64 09/09/2017 1203   CREATININE 0.7 09/25/2013 1103   CALCIUM 9.4 09/21/2017 0922   CALCIUM  9.1 09/25/2013 1103   PROT 7.0 09/21/2017 0922   PROT 7.3 09/25/2013 1103   ALBUMIN 3.7 09/21/2017 0922   ALBUMIN 3.7 09/25/2013 1103   AST 13 (L) 09/21/2017 0922   AST 14 09/25/2013 1103   ALT 9 09/21/2017 0922   ALT 11 09/25/2013 1103   ALKPHOS 104 09/21/2017 0922   ALKPHOS 79 09/25/2013 1103   BILITOT 0.2 (L) 09/21/2017 0922   BILITOT <0.20 09/25/2013 1103   GFRNONAA >60 09/21/2017 0922   GFRNONAA 85 09/09/2017 1203   GFRAA >60 09/21/2017 0922   GFRAA 98 09/09/2017 1203    Assessment: 1.  Acute on chronic iron deficiency anemia: History of iron deficiency anemia, last EGD and colonoscopy in 2015, EGD showed hiatal hernia with Lysbeth Galas erosions thought to be the source, has been following with Dr. Earlie Server for years and doing well until she stopped her iron supplementation on her own for 3 months and had a drop in hemoglobin from 11 down to 8, has been restarted now over the past month and has already seen an increase in hemoglobin, does describe some generalized abdominal burning but has a known history of heartburn and gastritis and has not been on medicine, this has improved over the past couple of weeks per patient, no weight loss or change in bowel habits; most likely acute on chronic iron deficiency anemia as due to the fact the patient stopped her iron supplementation, she does have chronic GI bleeding from known Cameron erosions 2. Epigastric pain 3. Heartburn  Plan: 1.  Did discuss possibility of repeating EGD and colonoscopy with the patient today.  She is hesitant to do this as she is older and had  a friend who had a perforation during a colonoscopy. 2.  Explained to patient that we will recheck her hemoglobin in 2 weeks.  If it continues to improve and her abdominal pain improves after starting a PPI then she can follow with Dr. Earlie Server for her iron deficiency anemia in the future.  If she continues with any pain or her hemoglobin drops would recommend further evaluation with  EGD and colonoscopy.  Patient verbalized understanding. 3.  Patient to continue her iron supplementation. 4.  Ordered repeat CBC for 2 weeks from today. 5.  Prescribed Lansoprazole 30 mg daily, 30 to 60 minutes before eating breakfast #30 with 3 refills 6.  Patient to follow in clinic per recommendations after labs above.  She was assigned to Dr. Loletha Carrow today.  Ellouise Newer, PA-C [facility] 10/04/2017, 10:31 AM  Cc: Unk Pinto, MD

## 2017-10-04 NOTE — Progress Notes (Signed)
Thank you for sending this case to me. I have reviewed the entire note, and the outlined plan seems appropriate.  It sounds like what she needs most is IV iron through hematology clinic.  Wilfrid Lund, MD

## 2017-10-04 NOTE — Patient Instructions (Signed)
We have sent the following medications to your pharmacy for you to pick up at your convenience: Lansoprazole 30 mg daily   Have labwork done in 2 weeks ( week of 10/17/17)  Please call the office with an update of your symptoms in the next two weeks and ask for Koren Shiver, RN, BSN.

## 2017-10-06 ENCOUNTER — Other Ambulatory Visit: Payer: Self-pay | Admitting: Physician Assistant

## 2017-10-19 ENCOUNTER — Other Ambulatory Visit (INDEPENDENT_AMBULATORY_CARE_PROVIDER_SITE_OTHER): Payer: Medicare Other

## 2017-10-19 DIAGNOSIS — R1013 Epigastric pain: Secondary | ICD-10-CM | POA: Diagnosis not present

## 2017-10-19 DIAGNOSIS — D509 Iron deficiency anemia, unspecified: Secondary | ICD-10-CM | POA: Diagnosis not present

## 2017-10-19 DIAGNOSIS — R12 Heartburn: Secondary | ICD-10-CM | POA: Diagnosis not present

## 2017-10-19 LAB — CBC WITH DIFFERENTIAL/PLATELET
BASOS ABS: 0 10*3/uL (ref 0.0–0.1)
BASOS PCT: 0.3 % (ref 0.0–3.0)
Eosinophils Absolute: 0 10*3/uL (ref 0.0–0.7)
Eosinophils Relative: 0.1 % (ref 0.0–5.0)
HCT: 37.6 % (ref 36.0–46.0)
HEMOGLOBIN: 12 g/dL (ref 12.0–15.0)
LYMPHS PCT: 26 % (ref 12.0–46.0)
Lymphs Abs: 1.4 10*3/uL (ref 0.7–4.0)
MCHC: 31.9 g/dL (ref 30.0–36.0)
MCV: 74.5 fl — AB (ref 78.0–100.0)
Monocytes Absolute: 0.4 10*3/uL (ref 0.1–1.0)
Monocytes Relative: 8.5 % (ref 3.0–12.0)
NEUTROS ABS: 3.4 10*3/uL (ref 1.4–7.7)
Neutrophils Relative %: 65.1 % (ref 43.0–77.0)
PLATELETS: 320 10*3/uL (ref 150.0–400.0)
RBC: 5.05 Mil/uL (ref 3.87–5.11)
RDW: 21.6 % — ABNORMAL HIGH (ref 11.5–15.5)
WBC: 5.2 10*3/uL (ref 4.0–10.5)

## 2017-10-27 ENCOUNTER — Ambulatory Visit
Admission: RE | Admit: 2017-10-27 | Discharge: 2017-10-27 | Disposition: A | Discharge status: Home / Self Care | Payer: Medicare Other | Source: Ambulatory Visit | Attending: Internal Medicine | Admitting: Internal Medicine

## 2017-10-27 DIAGNOSIS — Z1231 Encounter for screening mammogram for malignant neoplasm of breast: Secondary | ICD-10-CM

## 2017-10-28 ENCOUNTER — Other Ambulatory Visit: Payer: Self-pay | Admitting: Internal Medicine

## 2017-10-28 DIAGNOSIS — R928 Other abnormal and inconclusive findings on diagnostic imaging of breast: Secondary | ICD-10-CM

## 2017-10-31 ENCOUNTER — Ambulatory Visit
Admission: RE | Admit: 2017-10-31 | Discharge: 2017-10-31 | Disposition: A | Discharge status: Home / Self Care | Payer: Medicare Other | Source: Ambulatory Visit | Attending: Internal Medicine | Admitting: Internal Medicine

## 2017-10-31 DIAGNOSIS — R922 Inconclusive mammogram: Secondary | ICD-10-CM | POA: Diagnosis not present

## 2017-10-31 DIAGNOSIS — N6011 Diffuse cystic mastopathy of right breast: Secondary | ICD-10-CM | POA: Diagnosis not present

## 2017-10-31 DIAGNOSIS — R928 Other abnormal and inconclusive findings on diagnostic imaging of breast: Secondary | ICD-10-CM

## 2017-12-18 NOTE — Progress Notes (Signed)
Complete physical  Assessment:   Stephanie Roach was seen today for annual exam, other and sinus concerns.  Diagnoses and all orders for this visit:  Labile hypertension -     CBC with Differential/Platelet -     COMPLETE METABOLIC PANEL WITH GFR -     Magnesium -     Microalbumin / creatinine urine ratio -     Urinalysis w microscopic + reflex cultur  Iron deficiency anemia, unspecified iron deficiency anemia type -     CBC with Differential/Platelet -     Lipid panel -     Vitamin B12 -     Reticulocytes -     Folate RBC  Hyperlipidemia, mixed check lipids decrease fatty foods increase activity.   Chronic anxiety -continue medications, stress management techniques discussed, increase water, good sleep hygiene discussed, increase exercise, and increase veggies.   Vitamin D deficiency Continue supplimentation -     VITAMIN D 25 Hydroxy  Hx of adenomatous colonic polyps Up to date with colonoscopy Heme cards  Hiatal hernia Doing well at this time  Osteoporosis, unspecified osteoporosis type, unspecified pathological fracture presence Taking OTC Calcium and Vit D Due for DEXA scan, last 2017  Gastroesophageal reflux disease, esophagitis presence not specified Doing well on current regiment -     Magnesium -     omeprazole (PRILOSEC) 20 MG capsule; Take 1 capsule (20 mg total) by mouth daily.  Maxillary sinusitis, unspecified chronicity -     azithromycin (ZITHROMAX) 250 MG tablet; Take 2 tablets (500 mg) on  Day 1,  followed by 1 tablet (250 mg) once daily on Days 2 through 5. -     cetirizine (ZYRTEC) tablet 10 mg  Right wrist pain Continue to monitor Will do referral to [facility], Dr Gaynelle Arabian related to previous injury and repair, hx osteoporosis.  Sore throat -     cetirizine (ZYRTEC) tablet 10 mg OTC cepocal lesengers War/cold liquids  Abnormal result on screening urine test -     REFLEXIVE URINE CULTURE  Abnormal glucose Discussed dietary  and exercise modifications -     Hemoglobin A1c -     Insulin, random  BMI 24.0-24.9, adult Doing well Continue dietary management and exercise  Medication management -     CBC with Differential/Platelet -     COMPLETE METABOLIC PANEL WITH GFR -     Magnesium -     Lipid panel -     TSH -     Hemoglobin A1c -     Insulin, random -     VITAMIN D 25 Hydroxy (Vit-D Deficiency, Fractures) -     Microalbumin / creatinine urine ratio -     Urinalysis w microscopic + reflex cultur -     Vitamin B12 -     Reticulocytes -     Folate RBC -     REFLEXIVE URINE CULTURE     Future Appointments  Date Time Provider [place]  12/19/2017  3:00 PM Vicie Mutters, PA-C [facility] None  12/20/2017  1:00 PM [facility] 4 [facility] None  12/20/2017  1:30 PM Curt Bears, MD [facility] None  09/14/2018 11:15 AM Vicie Mutters, PA-C [facility] None  12/27/2018  3:00 PM Vicie Mutters, PA-C [facility] None    Plan:   During the course of the visit the patient was educated and counseled about appropriate screening and preventive services including:    Pneumococcal vaccine   Prevnar 13  Influenza vaccine  Td vaccine  Screening electrocardiogram  Bone densitometry screening  Colorectal cancer screening  Diabetes screening  Glaucoma screening  Nutrition counseling   Advanced directives: requested    Subjective:   Stephanie Roach is a 82 y.o. female who presents for medicare and follow up for chol, preDM, and HTN.   Reports that three weeks ago she fell while painting shutters up on a step ladder.  She reports that she moved the ladder and the ground was uneven and she fell into the brick wall.  She reports it was severely swollen.  She did take a couple advil which helped some.  There is still some swelling and her hand continues to cause pain which is throbbing.  She has seen Dr Daryll Brod in the past for hand specialist, related to fracture and plate  removal.     Two weeks ago she reports she tripped on her shoe string and she jarred her shoulder onto the left shoulder.  She tried a little bit of heat for the shoulder.  She reports that it is mildly sore and it popping with rotation.  Denies any open areas.  Reports that she has some sinus pressure and allergy type symptoms.  Reports that she had a sore throat this morning but it goes away after being up a few hours.  Reports that she has not taken anything for this as it started yesterday.   Her blood pressure has been controlled at home, today their BP is   She does not workout.  She denies chest pain,dizziness.   She has GERD and well controlled at this time.  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 She has been off integra, was following with Dr. Julien Nordmann.   She is not on cholesterol medication and denies myalgias. Her cholesterol is not at goal. The cholesterol last visit was:   Lab Results  Component Value Date   CHOL 232 (H) 09/09/2017   HDL 65 09/09/2017   LDLCALC 143 (H) 09/09/2017   TRIG 122 09/09/2017   CHOLHDL 3.6 09/09/2017    She has been working on diet and exercise for prediabetes, and denies polydipsia, polyuria and visual disturbances. Last A1C in the office was:  Lab Results  Component Value Date   HGBA1C 6.0 (H) 09/09/2017   Patient is on Vitamin D supplement, 2000 IU.   Lab Results  Component Value Date   VD25OH 38 06/09/2017     Saw Dr. Berenice Primas for bilateral knee pain, got injections that helped.  BMI is There is no height or weight on file to calculate BMI., she is working on diet and exercise. Wt Readings from Last 3 Encounters:  10/04/17 147 lb (66.7 kg)  09/21/17 149 lb 1.6 oz (67.6 kg)  09/09/17 150 lb (68 kg)     Names of Other Physician/Practitioners you currently use: 1. [facility]- here for  primary care 2. Dr. Katy Fitch, eye doctor, last visit 3 months 3. Dr. Osa Craver, dentist, last visit 6 months Patient Care Team: Unk Pinto, MD as PCP - General (Internal Medicine) Lafayette Dragon, MD (Inactive) as Consulting Physician (Gastroenterology) Norma Fredrickson, MD as Consulting Physician (Psychiatry) Curt Bears, MD as Consulting Physician (Oncology) Berenice Primas, MD as Referring Physician (Orthopedic Surgery)   Medication Review Current Outpatient Medications on File Prior to Visit  Medication Sig Dispense Refill  . Cholecalciferol (VITAMIN D  PO) Take 2,000 Units by mouth daily.    . Fe Fum-FePoly-FA-Vit C-Vit B3 (INTEGRA F) 125-1 MG CAPS Take 1 capsule by mouth daily. 90 capsule 1  . LORazepam (ATIVAN) 1 MG tablet Take 1 mg by mouth every 8 (eight) hours.      Marland Kitchen omeprazole (PRILOSEC) 20 MG capsule TAKE ONE CAPSULE EVERY DAY AT NOON 30 capsule 5  . OVER THE COUNTER MEDICATION Takes Tums 2 tablets daily    . traZODone (DESYREL) 50 MG tablet 50 mg at bedtime as needed.     . venlafaxine XR (EFFEXOR XR) 75 MG 24 hr capsule Take 3 capsules (225 mg total) daily by mouth. 270 capsule 1   No current facility-administered medications on file prior to visit.     Current Problems (verified) Patient Active Problem List   Diagnosis Date Noted  . Medication management 02/18/2015  . Hiatal hernia 02/18/2015  . Osteoporosis 09/17/2014  . Abnormal glucose   . Hyperlipidemia, mixed   . Labile hypertension   . Vitamin D deficiency   . Hx of adenomatous colonic polyps 08/11/2010  . Iron deficiency anemia 08/11/2010  . Chronic anxiety 08/11/2010    Screening Tests Immunization History  Administered Date(s) Administered  . Pneumococcal Conjugate-13 08/27/2013  . Pneumococcal Polysaccharide-23 10/23/2015  . Pneumococcal-Unspecified 12/06/2001  . Tdap 08/05/2012  . Zoster 02/05/2010    Preventative care: Last colonoscopy: 2015 EGD 2015 Last mammogram:  10/2017 Last pap smear/pelvic exam: 2012  DEXA: 09/2015 + osteoporosis off the fosamax- could not tolerate due to her stomach.   Prior vaccinations: TD or Tdap: 2014  Influenza: declines Prevnar 13: 2015 Pneumococcal: 2017 Shingles/Zostavax: 2012  Allergies Allergies  Allergen Reactions  . Morphine And Related     SURGICAL HISTORY She  has a past surgical history that includes Cholecystectomy (2003); Knee arthroscopy; Bunionectomy; Wrist fracture surgery; and Cataract extraction (Bilateral). FAMILY HISTORY Her family history includes Breast cancer in her cousin; Colon cancer in her father; Colon polyps in her maternal aunt; Pancreatic cancer in her paternal aunt; Stroke in her mother; Ulcerative colitis in her brother. SOCIAL HISTORY She  reports that she has never smoked. She has never used smokeless tobacco. She reports that she does not drink alcohol or use drugs.     Review of Systems  Constitutional: Negative for chills, diaphoresis, fever, malaise/fatigue and weight loss.  HENT: Positive for congestion, sinus pain and sore throat. Negative for ear discharge, ear pain, hearing loss, nosebleeds and tinnitus.   Eyes: Negative.  Negative for blurred vision, double vision, photophobia, pain, discharge and redness.  Respiratory: Negative.  Negative for cough, hemoptysis, sputum production, shortness of breath, wheezing and stridor.   Cardiovascular: Negative.  Negative for chest pain, palpitations, orthopnea, claudication, leg swelling and PND.  Gastrointestinal: Positive for heartburn. Negative for abdominal pain, blood in stool, constipation, diarrhea, melena, nausea and vomiting.       If she does not take medication regularly.  Genitourinary: Negative.  Negative for dysuria, flank pain, frequency, hematuria and urgency.  Musculoskeletal: Positive for falls and joint pain. Negative for back pain, myalgias and neck pain.       Right wrist pain and left shoulder pain related to  falls.  Separate occurences.  Skin: Negative.  Negative for itching and rash.  Neurological: Negative.  Negative for dizziness, tingling, tremors, sensory change, speech change, focal weakness, seizures, loss of consciousness, weakness and headaches.  Endo/Heme/Allergies: Negative for environmental allergies and polydipsia. Does not bruise/bleed easily.  Psychiatric/Behavioral: Negative.  Negative for depression, hallucinations, memory loss, substance abuse and suicidal ideas. The patient is not nervous/anxious and does not have insomnia.      Objective:     There were no vitals taken for this visit. There is no height or weight on file to calculate BMI.  General appearance: alert, no distress, WD/WN,  female HEENT: normocephalic, sclerae anicteric, TMs pearly, nares patent, no discharge or erythema, pharynx normal Oral cavity: MMM, no lesions Neck: supple, no lymphadenopathy, no thyromegaly, no masses Heart: RRR, normal S1, S2, no murmurs Lungs: CTA bilaterally, no wheezes, rhonchi, or rales Abdomen: +bs, soft, non tender, non distended, no masses, no hepatomegaly, no splenomegaly Musculoskeletal: nontender, no swelling, no obvious deformity.  Right wrist surgical scar, ventral. Tenderness generalized to carpal area with resolving contusion.4/5 strength BUE.  Right Shoulder, full ROM, no crepitus noted. Extremities: no edema, no cyanosis, no clubbing Pulses: 2+ symmetric, upper and lower extremities, normal cap refill Neurological: alert, oriented x 3, CN2-12 intact, strength normal upper extremities and lower extremities, sensation normal throughout, DTRs 2+ throughout, no cerebellar signs, gait normal Psychiatric: normal affect, behavior normal, pleasant  Breast:   breasts appear normal, no suspicious masses, no skin or nipple changes or axillary nodes. Gyn: defer  Rectal: defer      Garnet Sierras, NP   12/18/2017

## 2017-12-19 ENCOUNTER — Ambulatory Visit (INDEPENDENT_AMBULATORY_CARE_PROVIDER_SITE_OTHER): Payer: Medicare Other | Admitting: Adult Health Nurse Practitioner

## 2017-12-19 ENCOUNTER — Encounter: Payer: Self-pay | Admitting: Adult Health Nurse Practitioner

## 2017-12-19 DIAGNOSIS — M810 Age-related osteoporosis without current pathological fracture: Secondary | ICD-10-CM

## 2017-12-19 DIAGNOSIS — Z860101 Personal history of adenomatous and serrated colon polyps: Secondary | ICD-10-CM

## 2017-12-19 DIAGNOSIS — R8290 Unspecified abnormal findings in urine: Secondary | ICD-10-CM

## 2017-12-19 DIAGNOSIS — Z79899 Other long term (current) drug therapy: Secondary | ICD-10-CM | POA: Diagnosis not present

## 2017-12-19 DIAGNOSIS — R0989 Other specified symptoms and signs involving the circulatory and respiratory systems: Secondary | ICD-10-CM | POA: Diagnosis not present

## 2017-12-19 DIAGNOSIS — E559 Vitamin D deficiency, unspecified: Secondary | ICD-10-CM | POA: Diagnosis not present

## 2017-12-19 DIAGNOSIS — M25531 Pain in right wrist: Secondary | ICD-10-CM

## 2017-12-19 DIAGNOSIS — F419 Anxiety disorder, unspecified: Secondary | ICD-10-CM

## 2017-12-19 DIAGNOSIS — Z0000 Encounter for general adult medical examination without abnormal findings: Secondary | ICD-10-CM | POA: Diagnosis not present

## 2017-12-19 DIAGNOSIS — Z6824 Body mass index (BMI) 24.0-24.9, adult: Secondary | ICD-10-CM

## 2017-12-19 DIAGNOSIS — J029 Acute pharyngitis, unspecified: Secondary | ICD-10-CM

## 2017-12-19 DIAGNOSIS — E782 Mixed hyperlipidemia: Secondary | ICD-10-CM

## 2017-12-19 DIAGNOSIS — D509 Iron deficiency anemia, unspecified: Secondary | ICD-10-CM

## 2017-12-19 DIAGNOSIS — K219 Gastro-esophageal reflux disease without esophagitis: Secondary | ICD-10-CM

## 2017-12-19 DIAGNOSIS — K449 Diaphragmatic hernia without obstruction or gangrene: Secondary | ICD-10-CM

## 2017-12-19 DIAGNOSIS — R7309 Other abnormal glucose: Secondary | ICD-10-CM

## 2017-12-19 DIAGNOSIS — Z86010 Personal history of colonic polyps: Secondary | ICD-10-CM

## 2017-12-19 DIAGNOSIS — J320 Chronic maxillary sinusitis: Secondary | ICD-10-CM

## 2017-12-19 MED ORDER — AZITHROMYCIN 250 MG PO TABS: ORAL_TABLET | ORAL | 1 refills | Status: AC

## 2017-12-19 MED ORDER — CETIRIZINE HCL 10 MG PO TABS: 10.0000 mg | ORAL_TABLET | Freq: Every day | ORAL | Status: DC

## 2017-12-19 MED ORDER — OMEPRAZOLE 20 MG PO CPDR: 20.0000 mg | DELAYED_RELEASE_CAPSULE | Freq: Every day | ORAL | 3 refills | Status: DC

## 2017-12-19 NOTE — Patient Instructions (Addendum)
Sinus congestion:  Try using a Neti Pot twice a day.  Use warm distilled or bottled water.  DO NOT USE TAP WATER!  This will help to rinse and sooth your sinuses.  Take Zyrtec at night.  This will help to dry up fluid and reduce itching eyes and drainage into your throat and cough.  Sore throat: Gargle salt water, spit out Drink warm or cold liquids to sooth throat Honey lemon can help decrease cough  We will send in an antibiotic, if you are not better by the weekend you may pick this up.  We have sent in a prescription for omeprazole (Prilosec) for your acid reflux.  We will call you in 1-2 days with lab results     Sinus Rinse What is a sinus rinse? A sinus rinse is a home treatment. It rinses your sinuses with a mixture of salt and water (saline solution). Sinuses are air-filled spaces in your skull behind the bones of your face and forehead. They open into your nasal cavity. To do a sinus rinse, you will need:  Saline solution.  Neti pot or spray bottle. This releases the saline solution into your nose and through your sinuses. You can buy neti pots and spray bottles at: ? [employer]. ? A health food store. ? Online.  When should I do a sinus rinse? A sinus rinse can help to clear your nasal cavity. It can clear:  Mucus.  Dirt.  Dust.  Pollen.  You may do a sinus rinse when you have:  A cold.  A virus.  Allergies.  A sinus infection.  A stuffy nose.  If you are considering a sinus rinse:  Ask your child's doctor before doing a sinus rinse on your child.  Do not do a sinus rinse if you have had: ? Ear or nasal surgery. ? An ear infection. ? Blocked ears.  How do I do a sinus rinse?  Wash your hands.  Disinfect your device using the directions that came with the device.  Dry your device.  Use the solution that comes with your device or one that is sold separately in stores. Follow the mixing directions on the package.  Fill your  device with the amount of saline solution as stated in the device instructions.  Stand over a sink and tilt your head sideways over the sink.  Place the spout of the device in your upper nostril (the one closer to the ceiling).  Gently pour or squeeze the saline solution into the nasal cavity. The liquid should drain to the lower nostril if you are not too congested.  Gently blow your nose. Blowing too hard may cause ear pain.  Repeat in the other nostril.  Clean and rinse your device with clean water.  Air-dry your device. Are there risks of a sinus rinse? Sinus rinse is normally very safe and helpful. However, there are a few risks, which include:  A burning feeling in the sinuses. This may happen if you do not make the saline solution as instructed. Make sure to follow all directions when making the saline solution.  Infection from unclean water. This is rare, but possible.  Nasal irritation.  This information is not intended to replace advice given to you by your health care provider. Make sure you discuss any questions you have with your health care provider. Document Released: 08/08/2013 Document Revised: 12/09/2015 Document Reviewed: 05/29/2013 Elsevier Interactive Patient Education  2017 [employer].    We  Do NOT Approve of  [facility], [employer]  Our Patients  To Do Home Visits & We Do NOT Approve of [employer] > > > > > > > > > > > > > > > > > > > > > > > > > > > > > > > > > > > > > > >  Preventive Care for Adults  A healthy lifestyle and preventive care can promote health and wellness. Preventive health guidelines for women include the following key practices.  A routine yearly physical is a good way to check with your health care provider about your health and preventive screening. It is a chance to share any concerns and updates on your health and to receive a thorough exam.  Visit your dentist for a routine exam and preventive  care every 6 months. Brush your teeth twice a day and floss once a day. Good oral hygiene prevents tooth decay and gum disease.  The frequency of eye exams is based on your age, health, family medical history, use of contact lenses, and other factors. Follow your health care provider's recommendations for frequency of eye exams.  Eat a healthy diet. Foods like vegetables, fruits, whole grains, low-fat dairy products, and lean protein foods contain the nutrients you need without too many calories. Decrease your intake of foods high in solid fats, added sugars, and salt. Eat the right amount of calories for you. Get information about a proper diet from your health care provider, if necessary.  Regular physical exercise is one of the most important things you can do for your health. Most adults should get at least 150 minutes of moderate-intensity exercise (any activity that increases your heart rate and causes you to sweat) each week. In addition, most adults need muscle-strengthening exercises on 2 or more days a week.  Maintain a healthy weight. The body mass index (BMI) is a screening tool to identify possible weight problems. It provides an estimate of body fat based on height and weight. Your health care provider can find your BMI and can help you achieve or maintain a healthy weight. For adults 20 years and older:  A BMI below 18.5 is considered underweight.  A BMI of 18.5 to 24.9 is normal.  A BMI of 25 to 29.9 is considered overweight.  A BMI of 30 and above is considered obese.  Maintain normal blood lipids and cholesterol levels by exercising and minimizing your intake of saturated fat. Eat a balanced diet with plenty of fruit and vegetables. If your lipid or cholesterol levels are high, you are over 50, or you are at high risk for heart disease, you may need your cholesterol levels checked more frequently. Ongoing high lipid and cholesterol levels should be treated with medicines if diet  and exercise are not working.  If you smoke, find out from your health care provider how to quit. If you do not use tobacco, do not start.  Lung cancer screening is recommended for adults aged 7-80 years who are at high risk for developing lung cancer because of a history of smoking. A yearly low-dose CT scan of the lungs is recommended for people who have at least a 30-pack-year history of smoking and are a current smoker or have quit within the past 15 years. A pack year of smoking is smoking an average of 1 pack of cigarettes a day for 1 year (for example: 1 pack a day for 30 years or 2 packs a day  for 15 years). Yearly screening should continue until the smoker has stopped smoking for at least 15 years. Yearly screening should be stopped for people who develop a health problem that would prevent them from having lung cancer treatment.  Avoid use of street drugs. Do not share needles with anyone. Ask for help if you need support or instructions about stopping the use of drugs.  High blood pressure causes heart disease and increases the risk of stroke.  Ongoing high blood pressure should be treated with medicines if weight loss and exercise do not work.  If you are 59-63 years old, ask your health care provider if you should take aspirin to prevent strokes.  Diabetes screening involves taking a blood sample to check your fasting blood sugar level. This should be done once every 3 years, after age 61, if you are within normal weight and without risk factors for diabetes. Testing should be considered at a younger age or be carried out more frequently if you are overweight and have at least 1 risk factor for diabetes.  Breast cancer screening is essential preventive care for women. You should practice "breast self-awareness." This means understanding the normal appearance and feel of your breasts and may include breast self-examination. Any changes detected, no matter how small, should be reported to a  health care provider. Women in their 61s and 30s should have a clinical breast exam (CBE) by a health care provider as part of a regular health exam every 1 to 3 years. After age 39, women should have a CBE every year. Starting at age 43, women should consider having a mammogram (breast X-ray test) every year. Women who have a family history of breast cancer should talk to their health care provider about genetic screening. Women at a high risk of breast cancer should talk to their health care providers about having an MRI and a mammogram every year.  Breast cancer gene (BRCA)-related cancer risk assessment is recommended for women who have family members with BRCA-related cancers. BRCA-related cancers include breast, ovarian, tubal, and peritoneal cancers. Having family members with these cancers may be associated with an increased risk for harmful changes (mutations) in the breast cancer genes BRCA1 and BRCA2. Results of the assessment will determine the need for genetic counseling and BRCA1 and BRCA2 testing.  Routine pelvic exams to screen for cancer are no longer recommended for nonpregnant women who are considered low risk for cancer of the pelvic organs (ovaries, uterus, and vagina) and who do not have symptoms. Ask your health care provider if a screening pelvic exam is right for you.  If you have had past treatment for cervical cancer or a condition that could lead to cancer, you need Pap tests and screening for cancer for at least 20 years after your treatment. If Pap tests have been discontinued, your risk factors (such as having a new sexual partner) need to be reassessed to determine if screening should be resumed. Some women have medical problems that increase the chance of getting cervical cancer. In these cases, your health care provider may recommend more frequent screening and Pap tests.    Colorectal cancer can be detected and often prevented. Most routine colorectal cancer screening  begins at the age of 50 years and continues through age 57 years. However, your health care provider may recommend screening at an earlier age if you have risk factors for colon cancer. On a yearly basis, your health care provider may provide home test kits to check  for hidden blood in the stool. Use of a small camera at the end of a tube, to directly examine the colon (sigmoidoscopy or colonoscopy), can detect the earliest forms of colorectal cancer. Talk to your health care provider about this at age 47, when routine screening begins.  Direct exam of the colon should be repeated every 5-10 years through age 50 years, unless early forms of pre-cancerous polyps or small growths are found.  Osteoporosis is a disease in which the bones lose minerals and strength with aging. This can result in serious bone fractures or breaks. The risk of osteoporosis can be identified using a bone density scan. Women ages 26 years and over and women at risk for fractures or osteoporosis should discuss screening with their health care providers. Ask your health care provider whether you should take a calcium supplement or vitamin D to reduce the rate of osteoporosis.  Menopause can be associated with physical symptoms and risks. Hormone replacement therapy is available to decrease symptoms and risks. You should talk to your health care provider about whether hormone replacement therapy is right for you.  Use sunscreen. Apply sunscreen liberally and repeatedly throughout the day. You should seek shade when your shadow is shorter than you. Protect yourself by wearing long sleeves, pants, a wide-brimmed hat, and sunglasses year round, whenever you are outdoors.  Once a month, do a whole body skin exam, using a mirror to look at the skin on your back. Tell your health care provider of new moles, moles that have irregular borders, moles that are larger than a pencil eraser, or moles that have changed in shape or color.  Stay  current with required vaccines (immunizations).  Influenza vaccine. All adults should be immunized every year.  Tetanus, diphtheria, and acellular pertussis (Td, Tdap) vaccine. Pregnant women should receive 1 dose of Tdap vaccine during each pregnancy. The dose should be obtained regardless of the length of time since the last dose. Immunization is preferred during the 27th-36th week of gestation. An adult who has not previously received Tdap or who does not know her vaccine status should receive 1 dose of Tdap. This initial dose should be followed by tetanus and diphtheria toxoids (Td) booster doses every 10 years. Adults with an unknown or incomplete history of completing a 3-dose immunization series with Td-containing vaccines should begin or complete a primary immunization series including a Tdap dose. Adults should receive a Td booster every 10 years.    Zoster vaccine. One dose is recommended for adults aged 66 years or older unless certain conditions are present.    Pneumococcal 13-valent conjugate (PCV13) vaccine. When indicated, a person who is uncertain of her immunization history and has no record of immunization should receive the PCV13 vaccine. An adult aged 71 years or older who has certain medical conditions and has not been previously immunized should receive 1 dose of PCV13 vaccine. This PCV13 should be followed with a dose of pneumococcal polysaccharide (PPSV23) vaccine. The PPSV23 vaccine dose should be obtained at least 1 or more year(s) after the dose of PCV13 vaccine. An adult aged 20 years or older who has certain medical conditions and previously received 1 or more doses of PPSV23 vaccine should receive 1 dose of PCV13. The PCV13 vaccine dose should be obtained 1 or more years after the last PPSV23 vaccine dose.    Pneumococcal polysaccharide (PPSV23) vaccine. When PCV13 is also indicated, PCV13 should be obtained first. All adults aged 51 years and older should be  immunized.  An adult younger than age 64 years who has certain medical conditions should be immunized. Any person who resides in a nursing home or long-term care facility should be immunized. An adult smoker should be immunized. People with an immunocompromised condition and certain other conditions should receive both PCV13 and PPSV23 vaccines. People with human immunodeficiency virus (HIV) infection should be immunized as soon as possible after diagnosis. Immunization during chemotherapy or radiation therapy should be avoided. Routine use of PPSV23 vaccine is not recommended for American Indians, [place] Natives, or people younger than 65 years unless there are medical conditions that require PPSV23 vaccine. When indicated, people who have unknown immunization and have no record of immunization should receive PPSV23 vaccine. One-time revaccination 5 years after the first dose of PPSV23 is recommended for people aged 19-64 years who have chronic kidney failure, nephrotic syndrome, asplenia, or immunocompromised conditions. People who received 1-2 doses of PPSV23 before age 57 years should receive another dose of PPSV23 vaccine at age 34 years or later if at least 5 years have passed since the previous dose. Doses of PPSV23 are not needed for people immunized with PPSV23 at or after age 104 years.   Preventive Services / Frequency  Ages 80 years and over  Blood pressure check.  Lipid and cholesterol check.  Lung cancer screening. / Every year if you are aged 62-80 years and have a 30-pack-year history of smoking and currently smoke or have quit within the past 15 years. Yearly screening is stopped once you have quit smoking for at least 15 years or develop a health problem that would prevent you from having lung cancer treatment.  Clinical breast exam.** / Every year after age 90 years.   BRCA-related cancer risk assessment.** / For women who have family members with a BRCA-related cancer (breast, ovarian, tubal,  or peritoneal cancers).  Mammogram.** / Every year beginning at age 29 years and continuing for as long as you are in good health. Consult with your health care provider.  Pap test.** / Every 3 years starting at age 23 years through age 50 or 29 years with 3 consecutive normal Pap tests. Testing can be stopped between 65 and 70 years with 3 consecutive normal Pap tests and no abnormal Pap or HPV tests in the past 10 years.  Fecal occult blood test (FOBT) of stool. / Every year beginning at age 84 years and continuing until age 77 years. You may not need to do this test if you get a colonoscopy every 10 years.  Flexible sigmoidoscopy or colonoscopy.** / Every 5 years for a flexible sigmoidoscopy or every 10 years for a colonoscopy beginning at age 71 years and continuing until age 89 years.  Hepatitis C blood test.** / For all people born from [birth year] through 1963 and any individual with known risks for hepatitis C.  Osteoporosis screening.** / A one-time screening for women ages 33 years and over and women at risk for fractures or osteoporosis.  Skin self-exam. / Monthly.  Influenza vaccine. / Every year.  Tetanus, diphtheria, and acellular pertussis (Tdap/Td) vaccine.** / 1 dose of Td every 10 years.  Zoster vaccine.** / 1 dose for adults aged 1 years or older.  Pneumococcal 13-valent conjugate (PCV13) vaccine.** / Consult your health care provider.  Pneumococcal polysaccharide (PPSV23) vaccine.** / 1 dose for all adults aged 52 years and older. Screening for abdominal aortic aneurysm (AAA)  by ultrasound is recommended for people who have history of high blood pressure or  who are current or former smokers. ++++++++++++++++++++ Recommend Adult Low Dose Aspirin or  coated  Aspirin 81 mg daily  To reduce risk of Colon Cancer 20 %,  Skin Cancer 26 % ,  Melanoma 46%  and  Pancreatic cancer 60% ++++++++++++++++++++ Vitamin D goal  is between 70-100.  Please make sure that you are  taking your Vitamin D as directed.  It is very important as a natural anti-inflammatory  helping hair, skin, and nails, as well as reducing stroke and heart attack risk.  It helps your bones and helps with mood. It also decreases numerous cancer risks so please take it as directed.  Low Vit D is associated with a 200-300% higher risk for CANCER  and 200-300% higher risk for HEART   ATTACK  &  STROKE.   .....................................Marland Kitchen It is also associated with higher death rate at younger ages,  autoimmune diseases like Rheumatoid arthritis, Lupus, Multiple Sclerosis.    Also many other serious conditions, like depression, Alzheimer's Dementia, infertility, muscle aches, fatigue, fibromyalgia - just to name a few. ++++++++++++++++++ Recommend the book "The END of DIETING" by Dr Excell Seltzer  & the book "The END of DIABETES " by Dr Excell Seltzer At [email or web address] - get book & Audio CD's    Being diabetic has a  300% increased risk for heart attack, stroke, cancer, and alzheimer- type vascular dementia. It is very important that you work harder with diet by avoiding all foods that are white. Avoid white rice (brown & wild rice is OK), white potatoes (sweetpotatoes in moderation is OK), White bread or wheat bread or anything made out of white flour like bagels, donuts, rolls, buns, biscuits, cakes, pastries, cookies, pizza crust, and pasta (made from white flour & egg whites) - vegetarian pasta or spinach or wheat pasta is OK. Multigrain breads like Arnold's or [employer], or multigrain sandwich thins or flatbreads.  Diet, exercise and weight loss can reverse and cure diabetes in the early stages.  Diet, exercise and weight loss is very important in the control and prevention of complications of diabetes which affects every system in your body, ie. Brain - dementia/stroke, eyes - glaucoma/blindness, heart - heart attack/heart failure, kidneys - dialysis, stomach - gastric paralysis, intestines -  malabsorption, nerves - severe painful neuritis, circulation - gangrene & loss of a leg(s), and finally cancer and Alzheimers.    I recommend avoid fried & greasy foods,  sweets/candy, white rice (brown or wild rice or Quinoa is OK), white potatoes (sweet potatoes are OK) - anything made from white flour - bagels, doughnuts, rolls, buns, biscuits,white and wheat breads, pizza crust and traditional pasta made of white flour & egg white(vegetarian pasta or spinach or wheat pasta is OK).  Multi-grain bread is OK - like multi-grain flat bread or sandwich thins. Avoid alcohol in excess. Exercise is also important.    Eat all the vegetables you want - avoid meat, especially red meat and dairy - especially cheese.  Cheese is the most concentrated form of trans-fats which is the worst thing to clog up our arteries. Veggie cheese is OK which can be found in the fresh produce section at [employer] or [employer] or [employer]  +++++++++++++++++++ DASH Eating Plan  DASH stands for "Dietary Approaches to Stop Hypertension."   The DASH eating plan is a healthy eating plan that has been shown to reduce high blood pressure (hypertension). Additional health benefits may include reducing the risk of type 2 diabetes mellitus, heart disease,  and stroke. The DASH eating plan may also help with weight loss. WHAT DO I NEED TO KNOW ABOUT THE DASH EATING PLAN? For the DASH eating plan, you will follow these general guidelines:  Choose foods with a percent daily value for sodium of less than 5% (as listed on the food label).  Use salt-free seasonings or herbs instead of table salt or sea salt.  Check with your health care provider or pharmacist before using salt substitutes.  Eat lower-sodium products, often labeled as "lower sodium" or "no salt added."  Eat fresh foods.  Eat more vegetables, fruits, and low-fat dairy products.  Choose whole grains. Look for the word "whole" as the first word in the ingredient  list.  Choose fish   Limit sweets, desserts, sugars, and sugary drinks.  Choose heart-healthy fats.  Eat veggie cheese   Eat more home-cooked food and less restaurant, buffet, and fast food.  Limit fried foods.  Cook foods using methods other than frying.  Limit canned vegetables. If you do use them, rinse them well to decrease the sodium.  When eating at a restaurant, ask that your food be prepared with less salt, or no salt if possible.                      WHAT FOODS CAN I EAT? Read Dr Fara Olden Fuhrman's books on The End of Dieting & The End of Diabetes  Grains Whole grain or whole wheat bread. Brown rice. Whole grain or whole wheat pasta. Quinoa, bulgur, and whole grain cereals. Low-sodium cereals. Corn or whole wheat flour tortillas. Whole grain cornbread. Whole grain crackers. Low-sodium crackers.  Vegetables Fresh or frozen vegetables (raw, steamed, roasted, or grilled). Low-sodium or reduced-sodium tomato and vegetable juices. Low-sodium or reduced-sodium tomato sauce and paste. Low-sodium or reduced-sodium canned vegetables.   Fruits All fresh, canned (in natural juice), or frozen fruits.  Protein Products  All fish and seafood.  Dried beans, peas, or lentils. Unsalted nuts and seeds. Unsalted canned beans.  Dairy Low-fat dairy products, such as skim or 1% milk, 2% or reduced-fat cheeses, low-fat ricotta or cottage cheese, or plain low-fat yogurt. Low-sodium or reduced-sodium cheeses.  Fats and Oils Tub margarines without trans fats. Light or reduced-fat mayonnaise and salad dressings (reduced sodium). Avocado. Safflower, olive, or canola oils. Natural peanut or almond butter.  Other Unsalted popcorn and pretzels. The items listed above may not be a complete list of recommended foods or beverages. Contact your dietitian for more options.  +++++++++++++++  WHAT FOODS ARE NOT RECOMMENDED? Grains/ White flour or wheat flour White bread. White pasta. White rice.  Refined cornbread. Bagels and croissants. Crackers that contain trans fat.  Vegetables  Creamed or fried vegetables. Vegetables in a . Regular canned vegetables. Regular canned tomato sauce and paste. Regular tomato and vegetable juices.  Fruits Dried fruits. Canned fruit in light or heavy syrup. Fruit juice.  Meat and Other Protein Products Meat in general - RED meat & White meat.  Fatty cuts of meat. Ribs, chicken wings, all processed meats as bacon, sausage, bologna, salami, fatback, hot dogs, bratwurst and packaged luncheon meats.  Dairy Whole or 2% milk, cream, half-and-half, and cream cheese. Whole-fat or sweetened yogurt. Full-fat cheeses or blue cheese. Non-dairy creamers and whipped toppings. Processed cheese, cheese spreads, or cheese curds.  Condiments Onion and garlic salt, seasoned salt, table salt, and sea salt. Canned and packaged gravies. Worcestershire sauce. Tartar sauce. Barbecue sauce. Teriyaki sauce. Soy sauce, including reduced  sodium. Steak sauce. Fish sauce. Oyster sauce. Cocktail sauce. Horseradish. Ketchup and mustard. Meat flavorings and tenderizers. Bouillon cubes. Hot sauce. Tabasco sauce. Marinades. Taco seasonings. Relishes.  Fats and Oils Butter, stick margarine, lard, shortening and bacon fat. Coconut, palm kernel, or palm oils. Regular salad dressings.  Pickles and olives. Salted popcorn and pretzels.  The items listed above may not be a complete list of foods and beverages to avoid.

## 2017-12-20 ENCOUNTER — Inpatient Hospital Stay: Payer: Medicare Other

## 2017-12-20 ENCOUNTER — Telehealth: Payer: Self-pay | Admitting: Medical Oncology

## 2017-12-20 ENCOUNTER — Inpatient Hospital Stay: Payer: Medicare Other | Admitting: Internal Medicine

## 2017-12-20 ENCOUNTER — Telehealth: Payer: Self-pay | Admitting: Internal Medicine

## 2017-12-20 LAB — COMPLETE METABOLIC PANEL WITH GFR
AG Ratio: 1.6 (calc) (ref 1.0–2.5)
ALT: 11 U/L (ref 6–29)
AST: 15 U/L (ref 10–35)
CO2: 29 mmol/L (ref 20–32)
Calcium: 9.6 mg/dL (ref 8.6–10.4)
Chloride: 102 mmol/L (ref 98–110)
Creat: 0.65 mg/dL (ref 0.60–0.93)
GFR, Est African American: 98 mL/min/{1.73_m2} (ref >=60)
GFR, Est Non African American: 84 mL/min/{1.73_m2} (ref >=60)
Globulin: 2.7 g/dL (calc) (ref 1.9–3.7)
Potassium: 4.3 mmol/L (ref 3.5–5.3)
Total Bilirubin: 0.3 mg/dL (ref 0.2–1.2)
Total Protein: 6.9 g/dL (ref 6.1–8.1)

## 2017-12-20 LAB — CBC WITH DIFFERENTIAL/PLATELET
Basophils Absolute: 27 cells/uL (ref 0–200)
Basophils Relative: 0.4 %
Eosinophils Absolute: 0 {cells}/uL — ABNORMAL LOW (ref 15–500)
Eosinophils Relative: 0 %
HCT: 37.8 % (ref 35.0–45.0)
Hemoglobin: 12.4 g/dL (ref 11.7–15.5)
Lymphs Abs: 1139 {cells}/uL (ref 850–3900)
MCH: 25.2 pg — ABNORMAL LOW (ref 27.0–33.0)
MCHC: 32.8 g/dL (ref 32.0–36.0)
MCV: 76.8 fL — ABNORMAL LOW (ref 80.0–100.0)
MPV: 10.5 fL (ref 7.5–12.5)
Monocytes Relative: 8.8 %
Neutro Abs: 4945 {cells}/uL (ref 1500–7800)
Neutrophils Relative %: 73.8 %
Platelets: 344 Thousand/uL (ref 140–400)
RBC: 4.92 Million/uL (ref 3.80–5.10)
RDW: 15.4 % — ABNORMAL HIGH (ref 11.0–15.0)
Total Lymphocyte: 17 %
WBC mixed population: 590 {cells}/uL (ref 200–950)
WBC: 6.7 10*3/uL (ref 3.8–10.8)

## 2017-12-20 LAB — RETICULOCYTES
ABS Retic: 54120 {cells}/uL (ref 20000–8000)
Retic Ct Pct: 1.1 %

## 2017-12-20 LAB — URINALYSIS W MICROSCOPIC + REFLEX CULTURE
Bacteria, UA: NONE SEEN /HPF
Bilirubin Urine: NEGATIVE
Glucose, UA: NEGATIVE
Hgb urine dipstick: NEGATIVE
Hyaline Cast: NONE SEEN /LPF
Ketones, ur: NEGATIVE
Leukocyte Esterase: NEGATIVE
Nitrites, Initial: NEGATIVE
Protein, ur: NEGATIVE
RBC / HPF: NONE SEEN /HPF (ref 0–2)
Specific Gravity, Urine: 1.015 (ref 1.001–1.03)
Squamous Epithelial / HPF: NONE SEEN /HPF (ref <=5)
WBC, UA: NONE SEEN /HPF (ref 0–5)
pH: 6.5 (ref 5.0–8.0)

## 2017-12-20 LAB — COMPLETE METABOLIC PANEL WITHOUT GFR
Albumin: 4.2 g/dL (ref 3.6–5.1)
Alkaline phosphatase (APISO): 97 U/L (ref 33–130)
BUN: 15 mg/dL (ref 7–25)
Glucose, Bld: 95 mg/dL (ref 65–99)
Sodium: 139 mmol/L (ref 135–146)

## 2017-12-20 LAB — NO CULTURE INDICATED

## 2017-12-20 LAB — HEMOGLOBIN A1C
Hgb A1c MFr Bld: 5.8 %{Hb} — ABNORMAL HIGH (ref <5.7)
Mean Plasma Glucose: 120 (calc)
eAG (mmol/L): 6.6 (calc)

## 2017-12-20 LAB — LIPID PANEL
Cholesterol: 237 mg/dL — ABNORMAL HIGH (ref <200)
HDL: 63 mg/dL (ref >50)
LDL Cholesterol (Calc): 140 mg/dL (calc) — ABNORMAL HIGH
Non-HDL Cholesterol (Calc): 174 mg/dL (calc) — ABNORMAL HIGH (ref <130)
Total CHOL/HDL Ratio: 3.8 (calc) (ref <5.0)
Triglycerides: 202 mg/dL — ABNORMAL HIGH (ref <150)

## 2017-12-20 LAB — MICROALBUMIN / CREATININE URINE RATIO
Creatinine, Urine: 47 mg/dL (ref 20–275)
Microalb Creat Ratio: 9 ug/mg{creat} (ref <30)
Microalb, Ur: 0.4 mg/dL

## 2017-12-20 LAB — VITAMIN D 25 HYDROXY (VIT D DEFICIENCY, FRACTURES): Vit D, 25-Hydroxy: 38 ng/mL (ref 30–100)

## 2017-12-20 LAB — MAGNESIUM: Magnesium: 2.1 mg/dL (ref 1.5–2.5)

## 2017-12-20 LAB — VITAMIN B12: Vitamin B-12: 704 pg/mL (ref 200–1100)

## 2017-12-20 LAB — FOLATE RBC: RBC Folate: 965 ng/mL RBC (ref >280)

## 2017-12-20 LAB — INSULIN, RANDOM: Insulin: 3.1 u[IU]/mL (ref 2.0–19.6)

## 2017-12-20 LAB — TSH: TSH: 2.54 mIU/L (ref 0.40–4.50)

## 2017-12-20 NOTE — Telephone Encounter (Signed)
Cancelled appt today - pt states she has the flu.

## 2017-12-20 NOTE — Telephone Encounter (Signed)
R/s appt per 11/25 sch message - pt is aware of appt date and time   

## 2017-12-26 ENCOUNTER — Encounter: Payer: Self-pay | Admitting: Adult Health Nurse Practitioner

## 2018-01-09 DIAGNOSIS — M1812 Unilateral primary osteoarthritis of first carpometacarpal joint, left hand: Secondary | ICD-10-CM | POA: Diagnosis not present

## 2018-01-09 DIAGNOSIS — R2231 Localized swelling, mass and lump, right upper limb: Secondary | ICD-10-CM | POA: Diagnosis not present

## 2018-01-26 ENCOUNTER — Inpatient Hospital Stay: Payer: Medicare Other | Attending: Internal Medicine

## 2018-01-26 ENCOUNTER — Telehealth: Payer: Self-pay

## 2018-01-26 ENCOUNTER — Inpatient Hospital Stay (HOSPITAL_BASED_OUTPATIENT_CLINIC_OR_DEPARTMENT_OTHER): Payer: Medicare Other | Admitting: Internal Medicine

## 2018-01-26 VITALS — BP 133/75 | HR 85 | Temp 98.5°F | Resp 18 | Ht 65.0 in | Wt 145.4 lb

## 2018-01-26 DIAGNOSIS — D509 Iron deficiency anemia, unspecified: Secondary | ICD-10-CM | POA: Diagnosis not present

## 2018-01-26 LAB — IRON AND TIBC
IRON: 66 ug/dL (ref 41–142)
SATURATION RATIOS: 16 % — AB (ref 21–57)
TIBC: 420 ug/dL (ref 236–444)
UIBC: 354 ug/dL (ref 120–384)

## 2018-01-26 LAB — CBC WITH DIFFERENTIAL (CANCER CENTER ONLY)
Abs Immature Granulocytes: 0.01 10*3/uL (ref 0.00–0.07)
BASOS ABS: 0 10*3/uL (ref 0.0–0.1)
BASOS PCT: 1 %
Eosinophils Absolute: 0 10*3/uL (ref 0.0–0.5)
Eosinophils Relative: 0 %
HCT: 41.3 % (ref 36.0–46.0)
Hemoglobin: 12.7 g/dL (ref 12.0–15.0)
IMMATURE GRANULOCYTES: 0 %
Lymphocytes Relative: 24 %
Lymphs Abs: 1.2 10*3/uL (ref 0.7–4.0)
MCH: 25.7 pg — ABNORMAL LOW (ref 26.0–34.0)
MCHC: 30.8 g/dL (ref 30.0–36.0)
MCV: 83.6 fL (ref 80.0–100.0)
Monocytes Absolute: 0.4 10*3/uL (ref 0.1–1.0)
Monocytes Relative: 9 %
NEUTROS ABS: 3.4 10*3/uL (ref 1.7–7.7)
NEUTROS PCT: 66 %
NRBC: 0 % (ref 0.0–0.2)
PLATELETS: 375 10*3/uL (ref 150–400)
RBC: 4.94 MIL/uL (ref 3.87–5.11)
RDW: 14.5 % (ref 11.5–15.5)
WBC: 5.1 10*3/uL (ref 4.0–10.5)

## 2018-01-26 LAB — FERRITIN: Ferritin: 17 ng/mL (ref 11–307)

## 2018-01-26 NOTE — Progress Notes (Signed)
[place] [phone]   [phone]  OFFICE PROGRESS NOTE  Unk Pinto, MD [address] [place] [address]  DIAGNOSIS: Iron deficiency anemia  PRIOR THERAPY: None  CURRENT THERAPY: Integra +1 capsule by mouth daily.  INTERVAL HISTORY: Stephanie Roach 83 y.o. female returns to the clinic today for follow-up visit accompanied by her husband.  The patient is feeling fine today with no concerning complaints.  She denied having any current chest pain, shortness breath, cough or hemoptysis.  She denied having any nausea, vomiting, diarrhea or constipation.  She has no headache or visual changes.  She continues to tolerate her treatment with Integra +1 capsule p.o. daily fairly well.  She denied having any bleeding issues.  The patient is here today for evaluation with repeat CBC, iron study and ferritin.  MEDICAL HISTORY: Past Medical History:  Diagnosis Date  . Allergic rhinitis   . Anemia   . Anxiety   . Arthritis   . Depression   . GERD (gastroesophageal reflux disease)   . Hyperlipidemia   . Hypertension   . Migraine   . Prediabetes   . Sleep apnea   . Vitamin D deficiency     ALLERGIES:  is allergic to morphine and related.  MEDICATIONS:  Current Outpatient Medications  Medication Sig Dispense Refill  . Cholecalciferol (VITAMIN D PO) Take 2,000 Units by mouth daily.    . Fe Fum-FePoly-FA-Vit C-Vit B3 (INTEGRA F) 125-1 MG CAPS Take 1 capsule by mouth daily. 90 capsule 1  . LORazepam (ATIVAN) 1 MG tablet Take 1 mg by mouth every 8 (eight) hours.      Marland Kitchen omeprazole (PRILOSEC) 20 MG capsule Take 1 capsule (20 mg total) by mouth daily. 90 capsule 3  . OVER THE COUNTER MEDICATION Takes Tums 2 tablets daily    . traZODone (DESYREL) 50 MG tablet 50 mg at bedtime as needed.     . venlafaxine XR (EFFEXOR-XR) 150 MG 24 hr capsule TAKE 2 CAPSULES AT BEDTIME    . venlafaxine XR (EFFEXOR XR) 75 MG 24 hr capsule Take 3  capsules (225 mg total) daily by mouth. 270 capsule 1   Current Facility-Administered Medications  Medication Dose Route Frequency Provider Last Rate Last Dose  . cetirizine (ZYRTEC) tablet 10 mg  10 mg Oral Daily McClanahan, Kyra, NP        SURGICAL HISTORY:  Past Surgical History:  Procedure Laterality Date  . BUNIONECTOMY    . CATARACT EXTRACTION Bilateral   . CHOLECYSTECTOMY  2003  . KNEE ARTHROSCOPY    . WRIST FRACTURE SURGERY      REVIEW OF SYSTEMS:  A comprehensive review of systems was negative.   PHYSICAL EXAMINATION: General appearance: alert, cooperative and no distress Head: Normocephalic, without obvious abnormality, atraumatic Neck: no adenopathy, no JVD, supple, symmetrical, trachea midline and thyroid not enlarged, symmetric, no tenderness/mass/nodules Lymph nodes: Cervical, supraclavicular, and axillary nodes normal. Resp: clear to auscultation bilaterally Back: symmetric, no curvature. ROM normal. No CVA tenderness. Cardio: regular rate and rhythm, S1, S2 normal, no murmur, click, rub or gallop GI: soft, non-tender; bowel sounds normal; no masses,  no organomegaly Extremities: extremities normal, atraumatic, no cyanosis or edema  ECOG PERFORMANCE STATUS: 1 - Symptomatic but completely ambulatory  Blood pressure 133/75, pulse 85, temperature 98.5 F (36.9 C), temperature source Oral, resp. rate 18, height 5\' 5"  (1.651 m), weight 145 lb 6.4 oz (66 kg), SpO2 96 %.  LABORATORY DATA:  Lab Results  Component Value Date   WBC 5.1 01/26/2018   HGB 12.7 01/26/2018   HCT 41.3 01/26/2018   MCV 83.6 01/26/2018   PLT 375 01/26/2018      Chemistry      Component Value Date/Time   NA 139 12/19/2017 1702   NA 141 09/25/2013 1103   K 4.3 12/19/2017 1702   K 3.7 09/25/2013 1103   CL 102 12/19/2017 1702   CO2 29 12/19/2017 1702   CO2 22 09/25/2013 1103   BUN 15 12/19/2017 1702   BUN 14.8 09/25/2013 1103   CREATININE 0.65 12/19/2017 1702   CREATININE 0.7  09/25/2013 1103      Component Value Date/Time   CALCIUM 9.6 12/19/2017 1702   CALCIUM 9.1 09/25/2013 1103   ALKPHOS 104 09/21/2017 0922   ALKPHOS 79 09/25/2013 1103   AST 15 12/19/2017 1702   AST 13 (L) 09/21/2017 0922   AST 14 09/25/2013 1103   ALT 11 12/19/2017 1702   ALT 9 09/21/2017 0922   ALT 11 09/25/2013 1103   BILITOT 0.3 12/19/2017 1702   BILITOT 0.2 (L) 09/21/2017 0922   BILITOT <0.20 09/25/2013 1103       RADIOGRAPHIC STUDIES:  ASSESSMENT AND PLAN: This is a very pleasant 83 years old white female with iron deficiency anemia. She was treated in the past with Integra plus with improvement of her anemia but the patient stopped her treatment after a while.  She was found recently to have iron deficiency anemia again. She was started on Integra plus by her primary care physician for the last few months. Repeat CBC, iron study and ferritin showed no concerning findings for iron deficiency anemia at this point with her current treatment with Integra plus. I recommended for her to continue her treatment with Integra plus with the same dose either daily or every other day. I will see her back for follow-up visit in 6 months for evaluation and repeat CBC, iron study and ferritin. The patient was advised to call immediately if she has any concerning symptoms in the interval. The patient voices understanding of current disease status and treatment options and is in agreement with the current care plan. All questions were answered. The patient knows to call the clinic with any problems, questions or concerns. We can certainly see the patient much sooner if necessary.  Disclaimer: This note was dictated with voice recognition software. Similar sounding words can inadvertently be transcribed and may not be corrected upon review.

## 2018-01-26 NOTE — Telephone Encounter (Signed)
Printed avs and calender of upcoming appointment. Per 1/2 los 

## 2018-01-28 ENCOUNTER — Encounter: Payer: Self-pay | Admitting: Internal Medicine

## 2018-02-08 DIAGNOSIS — L570 Actinic keratosis: Secondary | ICD-10-CM | POA: Diagnosis not present

## 2018-02-08 DIAGNOSIS — D224 Melanocytic nevi of scalp and neck: Secondary | ICD-10-CM | POA: Diagnosis not present

## 2018-02-08 DIAGNOSIS — L720 Epidermal cyst: Secondary | ICD-10-CM | POA: Diagnosis not present

## 2018-02-08 DIAGNOSIS — D2262 Melanocytic nevi of left upper limb, including shoulder: Secondary | ICD-10-CM | POA: Diagnosis not present

## 2018-02-08 DIAGNOSIS — L821 Other seborrheic keratosis: Secondary | ICD-10-CM | POA: Diagnosis not present

## 2018-02-28 ENCOUNTER — Other Ambulatory Visit: Payer: Self-pay | Admitting: Physician Assistant

## 2018-02-28 DIAGNOSIS — D508 Other iron deficiency anemias: Secondary | ICD-10-CM

## 2018-02-28 MED ORDER — INTEGRA F 125-1 MG PO CAPS: 1.0000 | ORAL_CAPSULE | Freq: Every day | ORAL | 1 refills | Status: DC

## 2018-06-12 DIAGNOSIS — M79644 Pain in right finger(s): Secondary | ICD-10-CM | POA: Diagnosis not present

## 2018-06-12 DIAGNOSIS — M79601 Pain in right arm: Secondary | ICD-10-CM | POA: Diagnosis not present

## 2018-06-12 DIAGNOSIS — M79645 Pain in left finger(s): Secondary | ICD-10-CM | POA: Diagnosis not present

## 2018-07-27 ENCOUNTER — Inpatient Hospital Stay: Payer: Medicare Other

## 2018-07-27 ENCOUNTER — Inpatient Hospital Stay: Payer: Medicare Other | Admitting: Internal Medicine

## 2018-09-12 NOTE — Progress Notes (Signed)
Medicare wellness AND 3 MONTH FOLLOW  Assessment:    Labile hypertension - continue medications, DASH diet, exercise and monitor at home. Call if greater than 130/80.  -     CBC with Differential/Platelet -     COMPLETE METABOLIC PANEL WITH GFR -     TSH  Iron deficiency anemia, unspecified iron deficiency anemia type CHECK LABS -     Ferritin -     Iron,Total/Total Iron Binding Cap  Hyperlipidemia, mixed check lipids decrease fatty foods increase activity.  -     Lipid panel  Medication management  Vitamin D deficiency  Encounter for Medicare annual wellness exam 1 YEAR  Chronic anxiety -continue medications, stress management techniques discussed, increase water, good sleep hygiene discussed, increase exercise, and increase veggies.   Abnormal glucose Discussed disease progression and risks Discussed diet/exercise, weight management and risk modification -     Hemoglobin A1c  Hx of adenomatous colonic polyps  Osteoporosis, unspecified osteoporosis type, unspecified pathological fracture presence continue Vit D and Ca, weight bearing exercises  Hiatal hernia  BMI 24.0-24.9, adult    Future Appointments  Date Time Provider [place]  12/27/2018  3:00 PM Vicie Mutters, PA-C [id number] None    Plan:   During the course of the visit the patient was educated and counseled about appropriate screening and preventive services including:    Pneumococcal vaccine   Prevnar 13  Influenza vaccine  Td vaccine  Screening electrocardiogram  Bone densitometry screening  Colorectal cancer screening  Diabetes screening  Glaucoma screening  Nutrition counseling   Advanced directives: requested    Subjective:   Stephanie Roach is a 83 y.o. female who presents for medicare and follow up for chol, preDM, and HTN.   She is left handed, had a recent fall while carrying a ladder 2-3 months ago, she had mechanical trip/fall, she has seen hand  doctor, no breaks but she is now having right bicep pain.    Her blood pressure has been controlled at home, today their BP is BP: 132/74 She does not workout.  She denies chest pain,dizziness.   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 She is on integra and following with Dr. Julien Nordmann. Lab Results  Component Value Date   IRON 66 01/26/2018   TIBC 420 01/26/2018   FERRITIN 17 01/26/2018    Husband, charles diagnosed with parkinson's and he is declining very quickly. She states she worries a lot about him.. She is on effexor 150mg  a day which helps.   She is not on cholesterol medication and denies myalgias. Her cholesterol is not at goal. The cholesterol last visit was:   Lab Results  Component Value Date   CHOL 237 (H) 12/19/2017   HDL 63 12/19/2017   LDLCALC 140 (H) 12/19/2017   TRIG 202 (H) 12/19/2017   CHOLHDL 3.8 12/19/2017    She has been working on diet and exercise for prediabetes, and denies polydipsia, polyuria and visual disturbances. Last A1C in the office was:  Lab Results  Component Value Date   HGBA1C 5.8 (H) 12/19/2017   Patient is on Vitamin D supplement, 2000 IU.   Lab Results  Component Value Date   VD25OH 38 12/19/2017     Saw Dr. Berenice Primas for bilateral knee pain, got injections that helped.  BMI is Body mass index is 23.93 kg/m., she is working on diet and exercise. Wt Readings from  Last 3 Encounters:  09/14/18 146 lb (66.2 kg)  01/26/18 145 lb 6.4 oz (66 kg)  12/19/17 147 lb 6.4 oz (66.9 kg)     Names of Other Physician/Practitioners you currently use: 1. [facility]- here for primary care 2. Dr. Katy Fitch, eye doctor, last visit 3 months 3. Dr. Osa Craver, dentist, last visit 6 months Patient Care Team: Unk Pinto, MD as PCP - General (Internal Medicine) Lafayette Dragon, MD (Inactive) as Consulting Physician  (Gastroenterology) Norma Fredrickson, MD as Consulting Physician (Psychiatry) Curt Bears, MD as Consulting Physician (Oncology) Berenice Primas, MD as Referring Physician (Orthopedic Surgery) Daryll Brod, MD as Consulting Physician (Orthopedic Surgery)   Medication Review Current Outpatient Medications on File Prior to Visit  Medication Sig Dispense Refill  . Cholecalciferol (VITAMIN D PO) Take 2,000 Units by mouth daily.    . Fe Fum-FePoly-FA-Vit C-Vit B3 (INTEGRA F) 125-1 MG CAPS Take 1 capsule by mouth daily. 90 capsule 1  . LORazepam (ATIVAN) 1 MG tablet Take 1 mg by mouth every 8 (eight) hours.      Marland Kitchen omeprazole (PRILOSEC) 20 MG capsule Take 1 capsule (20 mg total) by mouth daily. 90 capsule 3  . OVER THE COUNTER MEDICATION Takes Tums 2 tablets daily    . traZODone (DESYREL) 50 MG tablet 50 mg at bedtime as needed.     . venlafaxine XR (EFFEXOR-XR) 150 MG 24 hr capsule TAKE 2 CAPSULES AT BEDTIME     Current Facility-Administered Medications on File Prior to Visit  Medication Dose Route Frequency Provider Last Rate Last Dose  . cetirizine (ZYRTEC) tablet 10 mg  10 mg Oral Daily McClanahan, Danton Sewer, NP        Current Problems (verified) Patient Active Problem List   Diagnosis Date Noted  . Medication management 02/18/2015  . Hiatal hernia 02/18/2015  . Osteoporosis 09/17/2014  . Abnormal glucose   . Hyperlipidemia, mixed   . Labile hypertension   . Vitamin D deficiency   . Hx of adenomatous colonic polyps 08/11/2010  . Iron deficiency anemia 08/11/2010  . Chronic anxiety 08/11/2010    Screening Tests Immunization History  Administered Date(s) Administered  . Pneumococcal Conjugate-13 08/27/2013  . Pneumococcal Polysaccharide-23 10/23/2015  . Pneumococcal-Unspecified 12/06/2001  . Tdap 08/05/2012  . Zoster 02/05/2010    Preventative care: Last colonoscopy: 2015 EGD 2015 Last mammogram: 10/2017 Last pap smear/pelvic exam: 2012  DEXA: 09/2015 + osteoporosis off  the fosamax- could not tolerate due to her stomach.   Prior vaccinations: TD or Tdap: 2014  Influenza: declines Prevnar 13: 2015 Pneumococcal: 2017 Shingles/Zostavax: 2012  Allergies Allergies  Allergen Reactions  . Morphine And Related     SURGICAL HISTORY She  has a past surgical history that includes Cholecystectomy (2003); Knee arthroscopy; Bunionectomy; Wrist fracture surgery; and Cataract extraction (Bilateral). FAMILY HISTORY Her family history includes Breast cancer in her cousin; Colon cancer in her father; Colon polyps in her maternal aunt; Pancreatic cancer in her paternal aunt; Stroke in her mother; Ulcerative colitis in her brother. SOCIAL HISTORY She  reports that she has never smoked. She has never used smokeless tobacco. She reports that she does not drink alcohol or use drugs.  MEDICARE WELLNESS OBJECTIVES: Physical activity: Current Exercise Habits: The patient does not participate in regular exercise at present(no exercise or walking but she does all the work due to husband's parkinson's) Cardiac risk factors: Cardiac Risk Factors include: advanced age (>69men, >74 women);dyslipidemia;hypertension;sedentary lifestyle Depression/mood screen:   Depression screen [facility] 2/9  09/14/2018  Decreased Interest 0  Down, Depressed, Hopeless 0  PHQ - 2 Score 0  Altered sleeping -  Tired, decreased energy -  Change in appetite -  Feeling bad or failure about yourself  -  Trouble concentrating -  Moving slowly or fidgety/restless -  Suicidal thoughts -  PHQ-9 Score -  Difficult doing work/chores -    ADLs:  In your present state of health, do you have any difficulty performing the following activities: 09/14/2018  Hearing? N  Comment some issues with right ear hearing  Vision? N  Comment wear glasses  Difficulty concentrating or making decisions? N  Walking or climbing stairs? N  Dressing or bathing? N  Doing errands, shopping? N  Some recent data might be hidden      Cognitive Testing  Alert? Yes  Normal Appearance?Yes  Oriented to person? Yes  Place? Yes   Time? Yes  Recall of three objects?  Yes  Can perform simple calculations? Yes  Displays appropriate judgment?Yes  Can read the correct time from a watch face?Yes  EOL planning: Does Patient Have a Medical Advance Directive?: No, Yes Type of Advance Directive: Healthcare Power of Attorney, Living will Does patient want to make changes to medical advance directive?: No - Patient declined Copy of [place] in Chart?: No - copy requested    Review of Systems  Constitutional: Positive for malaise/fatigue. Negative for chills, diaphoresis, fever and weight loss.  HENT: Negative.   Eyes: Negative.   Respiratory: Negative.   Cardiovascular: Negative.   Gastrointestinal: Negative.   Genitourinary: Negative.   Musculoskeletal: Negative.   Skin: Negative.   Neurological: Negative.  Negative for weakness.  Psychiatric/Behavioral: Negative.      Objective:     Blood pressure 132/74, pulse 63, temperature 97.6 F (36.4 C), height 5' 5.5" (1.664 m), weight 146 lb (66.2 kg), SpO2 97 %. Body mass index is 23.93 kg/m.  General appearance: alert, no distress, WD/WN,  female HEENT: normocephalic, sclerae anicteric, TMs pearly, nares patent, no discharge or erythema, pharynx normal Oral cavity: MMM, no lesions Neck: supple, no lymphadenopathy, no thyromegaly, no masses Heart: RRR, normal S1, S2, no murmurs Lungs: CTA bilaterally, no wheezes, rhonchi, or rales Abdomen: +bs, soft, non tender, non distended, no masses, no hepatomegaly, no splenomegaly Musculoskeletal: nontender, no swelling, no obvious deformity, Right bicipital groove tenderness, pain with external rotation of right shoulder, pain with abduction to 90 degrees, full abduction to 180 without pain, + yergason test, + speed test. Normal strength bilateral upper extremities, normal distal neurovascular exam.   Extremities: no edema, no cyanosis, no clubbing Pulses: 2+ symmetric, upper and lower extremities, normal cap refill Neurological: alert, oriented x 3, CN2-12 intact, strength normal upper extremities and lower extremities, sensation normal throughout, DTRs 2+ throughout, no cerebellar signs, gait normal Psychiatric: normal affect, behavior normal, pleasant  Breast:   breasts appear normal, no suspicious masses, no skin or nipple changes or axillary nodes. Gyn: defer  Rectal: defer   Medicare Attestation I have personally reviewed: The patient's medical and social history Their use of alcohol, tobacco or illicit drugs Their current medications and supplements The patient's functional ability including ADLs,fall risks, home safety risks, cognitive, and hearing and visual impairment Diet and physical activities Evidence for depression or mood disorders  The patient's weight, height, BMI, and visual acuity have been recorded in the chart.  I have made referrals, counseling, and provided education to the patient based on review of the above and  I have provided the patient with a written personalized care plan for preventive services.     Vicie Mutters, PA-C   09/14/2018

## 2018-09-14 ENCOUNTER — Encounter: Payer: Self-pay | Admitting: Physician Assistant

## 2018-09-14 ENCOUNTER — Other Ambulatory Visit: Payer: Self-pay

## 2018-09-14 ENCOUNTER — Ambulatory Visit (INDEPENDENT_AMBULATORY_CARE_PROVIDER_SITE_OTHER): Payer: Medicare Other | Admitting: Physician Assistant

## 2018-09-14 VITALS — BP 132/74 | HR 63 | Temp 97.6°F | Ht 65.5 in | Wt 146.0 lb

## 2018-09-14 DIAGNOSIS — R0989 Other specified symptoms and signs involving the circulatory and respiratory systems: Secondary | ICD-10-CM | POA: Diagnosis not present

## 2018-09-14 DIAGNOSIS — Z0001 Encounter for general adult medical examination with abnormal findings: Secondary | ICD-10-CM | POA: Diagnosis not present

## 2018-09-14 DIAGNOSIS — M810 Age-related osteoporosis without current pathological fracture: Secondary | ICD-10-CM

## 2018-09-14 DIAGNOSIS — R7309 Other abnormal glucose: Secondary | ICD-10-CM

## 2018-09-14 DIAGNOSIS — E559 Vitamin D deficiency, unspecified: Secondary | ICD-10-CM

## 2018-09-14 DIAGNOSIS — R6889 Other general symptoms and signs: Secondary | ICD-10-CM | POA: Diagnosis not present

## 2018-09-14 DIAGNOSIS — K449 Diaphragmatic hernia without obstruction or gangrene: Secondary | ICD-10-CM

## 2018-09-14 DIAGNOSIS — D509 Iron deficiency anemia, unspecified: Secondary | ICD-10-CM | POA: Diagnosis not present

## 2018-09-14 DIAGNOSIS — Z86010 Personal history of colonic polyps: Secondary | ICD-10-CM

## 2018-09-14 DIAGNOSIS — F419 Anxiety disorder, unspecified: Secondary | ICD-10-CM

## 2018-09-14 DIAGNOSIS — Z79899 Other long term (current) drug therapy: Secondary | ICD-10-CM | POA: Diagnosis not present

## 2018-09-14 DIAGNOSIS — E782 Mixed hyperlipidemia: Secondary | ICD-10-CM | POA: Diagnosis not present

## 2018-09-14 DIAGNOSIS — Z0000 Encounter for general adult medical examination without abnormal findings: Secondary | ICD-10-CM

## 2018-09-14 NOTE — Patient Instructions (Addendum)
Your LDL could improve, ideally we want it under a 100.  Your LDL is the bad cholesterol that can lead to heart attack and stroke. To lower your number you can decrease your fatty foods, red meat, cheese, milk and increase fiber like whole grains and veggies. You can also add a fiber supplement like Citracel or Benefiber, these do not cause gas and bloating and are safe to use. Especially if you have a strong family history of heart disease or stroke or you have evidence of plaque on any imaging like a chest xray, we may discuss at your next office visit putting you on a medication to get your number below 100.   MAXIMUM AMOUNT OF TYLENOL IN A DAY  You can take tylenol (500mg ) or tylenol arthritis (650mg ). The max you can take of tylenol a day is 3000mg  daily This is a max of 6 pills a day of the regular tyelnol (500mg )  Or a max of 4 a day of the tylenol arthritis (650mg ) as long as no other medications you are taking contain tylenol.   Rest with ice or heat for 2-3 days then you can start the range of motion exercises below  We can refer to physical therapy if you want or we can refer you to ortho for evaluation   Biceps Tendon Tendinitis (Proximal) and Tenosynovitis Rehab Ask your health care provider which exercises are safe for you. Do exercises exactly as told by your health care provider and adjust them as directed. It is normal to feel mild stretching, pulling, tightness, or discomfort as you do these exercises, but you should stop right away if you feel sudden pain or your pain gets worse. Do not begin these exercises until told by your health care provider. Stretching and range of motion exercises These exercises warm up your muscles and joints and improve the movement and flexibility of your arm and shoulder. These exercises also help to relieve pain and stiffness. Exercise A: Shoulder flexion   1. Stand facing a wall. Put your left / right hand on the wall. 2. Slide your left /  right hand up the wall. Stop when you feel a stretch in your shoulder, or when you reach the angle that is recommended by your health care provider. ? Use your other hand to help raise your arm, if needed. ? As your hand gets higher, you may need to step closer to the wall. ? Avoid shrugging your shoulder while you raise your arm. To do this, keep your shoulder blade tucked down toward your spine. 3. Hold for __________ seconds. 4. Slowly return to the starting position. Use your other arm to help, if needed. Repeat __________ times. Complete this exercise __________ times a day. Exercise B: Posterior capsule stretch (passive horizontal adduction)   1. Sit or stand and pull your left / right elbow across your chest, toward your other shoulder. Stop when you feel a gentle stretch in the back of your shoulder and upper arm. ? Keep your arm at shoulder height. ? Keep your arm as close to your body as you comfortably can. 2. Hold for __________ seconds. 3. Slowly return to the starting position. Repeat __________ times. Complete this exercise __________ times a day. Strengthening exercises These exercises build strength and endurance in your arm and shoulder. Endurance is the ability to use your muscles for a long time, even after your muscles get tired. Exercise C: Elbow flexion, supinated   1. Sit on a stable chair  without armrests, or stand. 2. If directed, hold a __________ weight in your left / right hand, or hold an exercise band with both hands. Your palms should face up toward the ceiling at the starting position. 3. Bend your left / right elbow and move your hand up toward your shoulder. Keep your other arm straight down, in the starting position. 4. Slowly return to the starting position. Repeat __________ times. Complete this exercise __________ times a day. Exercise D: Scapular protraction, supine   1. Lie on your back on a firm surface. If directed, hold a __________ weight in  your left / right hand. 2. Raise your left / right arm straight into the air so your hand is directly above your shoulder joint. 3. Push the weight into the air so your shoulder lifts off of the surface that you are lying on. Do not move your head, neck, or back. 4. Hold for __________ seconds. 5. Slowly return to the starting position. Let your muscles relax completely before you repeat this exercise. Repeat __________ times. Complete this exercise __________ times a day. Exercise E: Scapular retraction   1. Sit in a stable chair without armrests, or stand. 2. Secure an exercise band to a stable object in front of you so the band is at shoulder height. 3. Hold one end of the exercise band in each hand. 4. Squeeze your shoulder blades together and move your elbows slightly behind you. Do not shrug your shoulders. 5. Hold for __________ seconds. 6. Slowly return to the starting position. Repeat __________ times. Complete this exercise __________ times a day. This information is not intended to replace advice given to you by your health care provider. Make sure you discuss any questions you have with your health care provider. Document Released: 01/11/2005 Document Revised: 09/18/2015 Document Reviewed: 12/20/2014 Elsevier Interactive Patient Education  2019 [employer].

## 2018-09-15 LAB — LIPID PANEL
Cholesterol: 266 mg/dL — ABNORMAL HIGH (ref <200)
HDL: 62 mg/dL (ref >=50)
LDL Cholesterol (Calc): 174 mg/dL (calc) — ABNORMAL HIGH
Non-HDL Cholesterol (Calc): 204 mg/dL (calc) — ABNORMAL HIGH (ref <130)
Total CHOL/HDL Ratio: 4.3 (calc) (ref <5.0)
Triglycerides: 156 mg/dL — ABNORMAL HIGH (ref <150)

## 2018-09-15 LAB — COMPLETE METABOLIC PANEL WITH GFR
AG Ratio: 1.8 (calc) (ref 1.0–2.5)
ALT: 13 U/L (ref 6–29)
AST: 15 U/L (ref 10–35)
Albumin: 4.2 g/dL (ref 3.6–5.1)
Alkaline phosphatase (APISO): 82 U/L (ref 37–153)
BUN: 13 mg/dL (ref 7–25)
CO2: 23 mmol/L (ref 20–32)
Calcium: 9 mg/dL (ref 8.6–10.4)
Chloride: 107 mmol/L (ref 98–110)
Creat: 0.64 mg/dL (ref 0.60–0.88)
GFR, Est African American: 98 mL/min/{1.73_m2} (ref >=60)
GFR, Est Non African American: 84 mL/min/{1.73_m2} (ref >=60)
Globulin: 2.4 g/dL (calc) (ref 1.9–3.7)
Glucose, Bld: 115 mg/dL — ABNORMAL HIGH (ref 65–99)
Potassium: 4 mmol/L (ref 3.5–5.3)
Sodium: 141 mmol/L (ref 135–146)
Total Bilirubin: 0.3 mg/dL (ref 0.2–1.2)
Total Protein: 6.6 g/dL (ref 6.1–8.1)

## 2018-09-15 LAB — HEMOGLOBIN A1C
Hgb A1c MFr Bld: 6.1 % of total Hgb — ABNORMAL HIGH (ref <5.7)
Mean Plasma Glucose: 128 (calc)
eAG (mmol/L): 7.1 (calc)

## 2018-09-15 LAB — CBC WITH DIFFERENTIAL/PLATELET
Absolute Monocytes: 470 cells/uL (ref 200–950)
Basophils Absolute: 40 cells/uL (ref 0–200)
Basophils Relative: 0.8 %
Eosinophils Absolute: 20 cells/uL (ref 15–500)
Eosinophils Relative: 0.4 %
HCT: 39.1 % (ref 35.0–45.0)
Hemoglobin: 12.9 g/dL (ref 11.7–15.5)
Lymphs Abs: 1400 cells/uL (ref 850–3900)
MCH: 26.3 pg — ABNORMAL LOW (ref 27.0–33.0)
MCHC: 33 g/dL (ref 32.0–36.0)
MCV: 79.6 fL — ABNORMAL LOW (ref 80.0–100.0)
MPV: 11.2 fL (ref 7.5–12.5)
Monocytes Relative: 9.4 %
Neutro Abs: 3070 cells/uL (ref 1500–7800)
Neutrophils Relative %: 61.4 %
Platelets: 322 10*3/uL (ref 140–400)
RBC: 4.91 10*6/uL (ref 3.80–5.10)
RDW: 13.4 % (ref 11.0–15.0)
Total Lymphocyte: 28 %
WBC: 5 10*3/uL (ref 3.8–10.8)

## 2018-09-15 LAB — MAGNESIUM: Magnesium: 2.1 mg/dL (ref 1.5–2.5)

## 2018-09-15 LAB — VITAMIN D 25 HYDROXY (VIT D DEFICIENCY, FRACTURES): Vit D, 25-Hydroxy: 85 ng/mL (ref 30–100)

## 2018-09-15 LAB — TSH: TSH: 1.98 mIU/L (ref 0.40–4.50)

## 2018-09-29 ENCOUNTER — Other Ambulatory Visit: Payer: Self-pay | Admitting: Internal Medicine

## 2018-09-29 DIAGNOSIS — Z1231 Encounter for screening mammogram for malignant neoplasm of breast: Secondary | ICD-10-CM

## 2018-10-02 ENCOUNTER — Encounter: Payer: Self-pay | Admitting: Gastroenterology

## 2018-10-09 ENCOUNTER — Telehealth: Payer: Self-pay | Admitting: Internal Medicine

## 2018-10-09 NOTE — Telephone Encounter (Signed)
Patient called with complaint of right breast pain at 3 oclock, recurrent issue. Patient states has Lump in Left breast at 9 oclock.  Please order  [facility] bilateral diagnostic mammogram [id number] for  right breast pain at 3oclock & left breast lump at 9oclock [place]  Right  breast unilateral  [id number] for right breast pain at 3oclock [place] Left breast unilateral [id number] for left breast lump at [facility]

## 2018-10-10 ENCOUNTER — Other Ambulatory Visit: Payer: Self-pay | Admitting: Internal Medicine

## 2018-10-10 DIAGNOSIS — N644 Mastodynia: Secondary | ICD-10-CM

## 2018-10-10 DIAGNOSIS — N63 Unspecified lump in unspecified breast: Secondary | ICD-10-CM

## 2018-10-18 ENCOUNTER — Ambulatory Visit
Admission: RE | Admit: 2018-10-18 | Discharge: 2018-10-18 | Disposition: A | Discharge status: Home / Self Care | Payer: Medicare Other | Source: Ambulatory Visit | Attending: Internal Medicine | Admitting: Internal Medicine

## 2018-10-18 ENCOUNTER — Other Ambulatory Visit: Payer: Self-pay | Admitting: Internal Medicine

## 2018-10-18 ENCOUNTER — Other Ambulatory Visit: Payer: Self-pay

## 2018-10-18 DIAGNOSIS — R928 Other abnormal and inconclusive findings on diagnostic imaging of breast: Secondary | ICD-10-CM | POA: Diagnosis not present

## 2018-10-18 DIAGNOSIS — N6489 Other specified disorders of breast: Secondary | ICD-10-CM | POA: Diagnosis not present

## 2018-10-18 DIAGNOSIS — N644 Mastodynia: Secondary | ICD-10-CM

## 2018-10-18 DIAGNOSIS — N63 Unspecified lump in unspecified breast: Secondary | ICD-10-CM

## 2018-10-19 ENCOUNTER — Ambulatory Visit
Admission: RE | Admit: 2018-10-19 | Discharge: 2018-10-19 | Disposition: A | Discharge status: Home / Self Care | Payer: Medicare Other | Source: Ambulatory Visit | Attending: Internal Medicine | Admitting: Internal Medicine

## 2018-10-19 ENCOUNTER — Other Ambulatory Visit: Payer: Self-pay | Admitting: General Practice

## 2018-10-19 DIAGNOSIS — N6489 Other specified disorders of breast: Secondary | ICD-10-CM

## 2018-10-19 DIAGNOSIS — N6011 Diffuse cystic mastopathy of right breast: Secondary | ICD-10-CM | POA: Diagnosis not present

## 2018-10-19 HISTORY — PX: BREAST BIOPSY: SHX20

## 2018-12-26 NOTE — Progress Notes (Signed)
CPE AND 3 MONTH FOLLOW  Assessment:   Other iron deficiency anemia -     Iron,Total/Total Iron Binding Cap -     Ferritin -     Fe Fum-FePoly-FA-Vit C-Vit B3 (INTEGRA F) 125-1 MG CAPS; Take 1 capsule by mouth daily.  Chronic pain of both knees -     diclofenac Sodium (VOLTAREN) 1 % GEL; Apply 4 g topically 4 (four) times daily. Likely OA Will try topical NSAID if not better, will refer to ortho  Need for immunization against influenza  Labile hypertension - continue medications, DASH diet, exercise and monitor at home. Call if greater than 130/80.  -     CBC with Differential/Platelet -     COMPLETE METABOLIC PANEL WITH GFR -     TSH  Hyperlipidemia, mixed check lipids decrease fatty foods increase activity.  -     Lipid panel  Medication management  Vitamin D deficiency  Encounter for Medicare annual wellness exam 1 YEAR  Chronic anxiety -continue medications, stress management techniques discussed, increase water, good sleep hygiene discussed, increase exercise, and increase veggies.   Abnormal glucose Discussed disease progression and risks Discussed diet/exercise, weight management and risk modification -     Hemoglobin A1c  Hx of adenomatous colonic polyps  Osteoporosis, unspecified osteoporosis type, unspecified pathological fracture presence continue Vit D and Ca, weight bearing exercises  Hiatal hernia  BMI 24.0-24.9, adult    Future Appointments  Date Time Provider [place]  09/19/2019 11:15 AM Vicie Mutters, PA-C [facility] None  12/27/2019  3:00 PM Vicie Mutters, PA-C [facility] None      Subjective:   Stephanie Roach is a 83 y.o. female who presents for CPE and follow up for chol, preDM, and HTN.    Her blood pressure has been controlled at home, today their BP is BP: 120/80 She does not workout.  She denies chest pain,dizziness.   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 She is on integra and following with Dr. Julien Nordmann. Lab Results  Component Value Date   IRON 66 01/26/2018   TIBC 420 01/26/2018   FERRITIN 17 01/26/2018   Husband, charles diagnosed with parkinson's and he is declining very quickly. She states she worries a lot about him.. She is on effexor 150mg  a day which helps.   She is not on cholesterol medication and denies myalgias. Her cholesterol is not at goal. The cholesterol last visit was:   Lab Results  Component Value Date   CHOL 266 (H) 09/14/2018   HDL 62 09/14/2018   LDLCALC 174 (H) 09/14/2018   TRIG 156 (H) 09/14/2018   CHOLHDL 4.3 09/14/2018    She has been working on diet and exercise for prediabetes, and denies polydipsia, polyuria and visual disturbances. Last A1C in the office was:  Lab Results  Component Value Date   HGBA1C 6.1 (H) 09/14/2018   Patient is on Vitamin D supplement, 2000 IU.   Lab Results  Component Value Date   VD25OH 85 09/14/2018     Saw Dr. Berenice Primas for bilateral knee pain, got injections that helped.  BMI is Body mass index is 24.09 kg/m., she is working on diet and exercise. Wt Readings from Last 3 Encounters:  12/27/18 147 lb (66.7 kg)  09/14/18 146 lb (66.2 kg)  01/26/18 145 lb 6.4 oz (66 kg)     Names of Other Physician/Practitioners you currently use: 1.  [facility]- here for primary care 2. Dr. Katy Fitch, eye doctor, last visit 3 months 3. Dr. Osa Craver, dentist, last visit 6 months Patient Care Team: Unk Pinto, MD as PCP - General (Internal Medicine) Lafayette Dragon, MD (Inactive) as Consulting Physician (Gastroenterology) Norma Fredrickson, MD as Consulting Physician (Psychiatry) Curt Bears, MD as Consulting Physician (Oncology) Berenice Primas, MD as Referring Physician (Orthopedic Surgery) Daryll Brod, MD as Consulting Physician (Orthopedic Surgery)   Medication Review Current Outpatient Medications on File  Prior to Visit  Medication Sig Dispense Refill  . Cholecalciferol (VITAMIN D PO) Take 2,000 Units by mouth daily.    . Fe Fum-FePoly-FA-Vit C-Vit B3 (INTEGRA F) 125-1 MG CAPS Take 1 capsule by mouth daily. 90 capsule 1  . LORazepam (ATIVAN) 1 MG tablet Take 1 mg by mouth every 8 (eight) hours.      Marland Kitchen omeprazole (PRILOSEC) 20 MG capsule Take 1 capsule (20 mg total) by mouth daily. 90 capsule 3  . OVER THE COUNTER MEDICATION Takes Tums 2 tablets daily    . traZODone (DESYREL) 50 MG tablet 50 mg at bedtime as needed.     . venlafaxine XR (EFFEXOR-XR) 150 MG 24 hr capsule TAKE 2 CAPSULES AT BEDTIME     Current Facility-Administered Medications on File Prior to Visit  Medication Dose Route Frequency Provider Last Rate Last Dose  . cetirizine (ZYRTEC) tablet 10 mg  10 mg Oral Daily McClanahan, Danton Sewer, NP        Current Problems (verified) Patient Active Problem List   Diagnosis Date Noted  . Medication management 02/18/2015  . Hiatal hernia 02/18/2015  . Osteoporosis 09/17/2014  . Abnormal glucose   . Hyperlipidemia, mixed   . Labile hypertension   . Vitamin D deficiency   . Hx of adenomatous colonic polyps 08/11/2010  . Iron deficiency anemia 08/11/2010  . Chronic anxiety 08/11/2010    Screening Tests Immunization History  Administered Date(s) Administered  . Pneumococcal Conjugate-13 08/27/2013  . Pneumococcal Polysaccharide-23 10/23/2015  . Pneumococcal-Unspecified 12/06/2001  . Tdap 08/05/2012  . Zoster 02/05/2010    Preventative care: Last colonoscopy: 2015 EGD 2015 Last mammogram: 09/2018 Last pap smear/pelvic exam: 2012  DEXA: 09/2015 + osteoporosis off the fosamax- could not tolerate due to her stomach.   Prior vaccinations: TD or Tdap: 2014  Influenza: will get Prevnar 13: 2015 Pneumococcal: 2017 Shingles/Zostavax: 2012  Allergies Allergies  Allergen Reactions  . Morphine And Related     SURGICAL HISTORY She  has a past surgical history that includes  Cholecystectomy (2003); Knee arthroscopy; Bunionectomy; Wrist fracture surgery; and Cataract extraction (Bilateral). FAMILY HISTORY Her family history includes Breast cancer in her cousin; Colon cancer in her father; Colon polyps in her maternal aunt; Pancreatic cancer in her paternal aunt; Stroke in her mother; Ulcerative colitis in her brother. SOCIAL HISTORY She  reports that she has never smoked. She has never used smokeless tobacco. She reports that she does not drink alcohol or use drugs.  Review of Systems  Constitutional: Positive for malaise/fatigue. Negative for chills, diaphoresis, fever and weight loss.  HENT: Negative.   Eyes: Negative.   Respiratory: Negative.   Cardiovascular: Negative.   Gastrointestinal: Negative.   Genitourinary: Negative.   Musculoskeletal: Negative.   Skin: Negative.   Neurological: Negative.  Negative for weakness.  Psychiatric/Behavioral: Negative.      Objective:     Blood pressure 120/80, pulse 87, temperature (!) 97.2 F (36.2 C), height 5' 5.5" (1.664 m), weight 147 lb (66.7  kg), SpO2 95 %. Body mass index is 24.09 kg/m.  General appearance: alert, no distress, WD/WN,  female HEENT: normocephalic, sclerae anicteric, TMs pearly, nares patent, no discharge or erythema, pharynx normal Oral cavity: MMM, no lesions Neck: supple, no lymphadenopathy, no thyromegaly, no masses Heart: RRR, normal S1, S2, no murmurs Lungs: CTA bilaterally, no wheezes, rhonchi, or rales Abdomen: +bs, soft, non tender, non distended, no masses, no hepatomegaly, no splenomegaly Musculoskeletal: nontender, no swelling, no obvious deformity.  Extremities: no edema, no cyanosis, no clubbing Pulses: 2+ symmetric, upper and lower extremities, normal cap refill Neurological: alert, oriented x 3, CN2-12 intact, strength normal upper extremities and lower extremities, sensation normal throughout, DTRs 2+ throughout, no cerebellar signs, gait normal Psychiatric: normal  affect, behavior normal, pleasant  Breast:   breasts appear normal, no suspicious masses, no skin or nipple changes or axillary nodes. Gyn: defer  Rectal: defer  Vicie Mutters, PA-C   12/27/2018

## 2018-12-27 ENCOUNTER — Encounter: Payer: Self-pay | Admitting: Physician Assistant

## 2018-12-27 ENCOUNTER — Ambulatory Visit (INDEPENDENT_AMBULATORY_CARE_PROVIDER_SITE_OTHER): Payer: Medicare Other | Admitting: Physician Assistant

## 2018-12-27 ENCOUNTER — Other Ambulatory Visit: Payer: Self-pay

## 2018-12-27 VITALS — BP 120/80 | HR 87 | Temp 97.2°F | Ht 65.5 in | Wt 147.0 lb

## 2018-12-27 DIAGNOSIS — Z23 Encounter for immunization: Secondary | ICD-10-CM | POA: Diagnosis not present

## 2018-12-27 DIAGNOSIS — R0989 Other specified symptoms and signs involving the circulatory and respiratory systems: Secondary | ICD-10-CM

## 2018-12-27 DIAGNOSIS — D508 Other iron deficiency anemias: Secondary | ICD-10-CM | POA: Diagnosis not present

## 2018-12-27 DIAGNOSIS — E782 Mixed hyperlipidemia: Secondary | ICD-10-CM

## 2018-12-27 DIAGNOSIS — Z136 Encounter for screening for cardiovascular disorders: Secondary | ICD-10-CM | POA: Diagnosis not present

## 2018-12-27 DIAGNOSIS — Z0000 Encounter for general adult medical examination without abnormal findings: Secondary | ICD-10-CM

## 2018-12-27 DIAGNOSIS — M25562 Pain in left knee: Secondary | ICD-10-CM

## 2018-12-27 DIAGNOSIS — E559 Vitamin D deficiency, unspecified: Secondary | ICD-10-CM | POA: Diagnosis not present

## 2018-12-27 DIAGNOSIS — Z0001 Encounter for general adult medical examination with abnormal findings: Secondary | ICD-10-CM

## 2018-12-27 DIAGNOSIS — D509 Iron deficiency anemia, unspecified: Secondary | ICD-10-CM

## 2018-12-27 DIAGNOSIS — R7309 Other abnormal glucose: Secondary | ICD-10-CM | POA: Diagnosis not present

## 2018-12-27 DIAGNOSIS — Z79899 Other long term (current) drug therapy: Secondary | ICD-10-CM

## 2018-12-27 DIAGNOSIS — K449 Diaphragmatic hernia without obstruction or gangrene: Secondary | ICD-10-CM

## 2018-12-27 DIAGNOSIS — Z86010 Personal history of colonic polyps: Secondary | ICD-10-CM

## 2018-12-27 DIAGNOSIS — F419 Anxiety disorder, unspecified: Secondary | ICD-10-CM

## 2018-12-27 DIAGNOSIS — M810 Age-related osteoporosis without current pathological fracture: Secondary | ICD-10-CM

## 2018-12-27 MED ORDER — DICLOFENAC SODIUM 1 % EX GEL: 4.0000 g | Freq: Four times a day (QID) | CUTANEOUS | 3 refills | Status: DC

## 2018-12-27 MED ORDER — INTEGRA F 125-1 MG PO CAPS: 1.0000 | ORAL_CAPSULE | Freq: Every day | ORAL | 1 refills | Status: DC

## 2018-12-27 NOTE — Patient Instructions (Addendum)
Your LDL is not in range or at goal, goal is less than 70.  Your LDL is the bad cholesterol that can lead to heart attack and stroke. To lower your number you can decrease your fatty foods, red meat, cheese, milk and increase fiber like whole grains and veggies. You can also add a fiber supplement like Citracel or Benefiber, these do not cause gas and bloating and are safe to use. Since you have risk factors that make [place] want your number below 70, we need to adjust or add medications to get your number below goal.   MAXIMUM AMOUNT OF TYLENOL IN A DAY  You can take tylenol (500mg ) or tylenol arthritis (650mg ) with the meloxicam/antiinflammatories. The max you can take of tylenol a day is 3000mg  daily, this is a max of 6 pills a day of the regular tyelnol (500mg ) or a max of 4 a day of the tylenol arthritis (650mg ) as long as no other medications you are taking contain tylenol.    Osteoarthritis  Osteoarthritis is a type of arthritis that affects tissue that covers the ends of bones in joints (cartilage). Cartilage acts as a cushion between the bones and helps them move smoothly. Osteoarthritis results when cartilage in the joints gets worn down. Osteoarthritis is sometimes called "wear and tear" arthritis. Osteoarthritis is the most common form of arthritis. It often occurs in older people. It is a condition that gets worse over time (a progressive condition). Joints that are most often affected by this condition are in:  Fingers.  Toes.  Hips.  Knees.  Spine, including neck and lower back. What are the causes? This condition is caused by age-related wearing down of cartilage that covers the ends of bones. What increases the risk? The following factors may make you more likely to develop this condition:  Older age.  Being overweight or obese.  Overuse of joints, such as in athletes.  Past injury of a joint.  Past surgery on a joint.  Family history of osteoarthritis. What are the  signs or symptoms? The main symptoms of this condition are pain, swelling, and stiffness in the joint. The joint may lose its shape over time. Small pieces of bone or cartilage may break off and float inside of the joint, which may cause more pain and damage to the joint. Small deposits of bone (osteophytes) may grow on the edges of the joint. Other symptoms may include:  A grating or scraping feeling inside the joint when you move it.  Popping or creaking sounds when you move. Symptoms may affect one or more joints. Osteoarthritis in a major joint, such as your knee or hip, can make it painful to walk or exercise. If you have osteoarthritis in your hands, you might not be able to grip items, twist your hand, or control small movements of your hands and fingers (fine motor skills). How is this diagnosed? This condition may be diagnosed based on:  Your medical history.  A physical exam.  Your symptoms.  X-rays of the affected joint(s).  Blood tests to rule out other types of arthritis. How is this treated? There is no cure for this condition, but treatment can help to control pain and improve joint function. Treatment plans may include:  A prescribed exercise program that allows for rest and joint relief. You may work with a physical therapist.  A weight control plan.  Pain relief techniques, such as: ? Applying heat and cold to the joint. ? Electric pulses delivered  to nerve endings under the skin (transcutaneous electrical nerve stimulation, or TENS). ? Massage. ? Certain nutritional supplements.  NSAIDs or prescription medicines to help relieve pain.  Medicine to help relieve pain and inflammation (corticosteroids). This can be given by mouth (orally) or as an injection.  Assistive devices, such as a brace, wrap, splint, specialized glove, or cane.  Surgery, such as: ? An osteotomy. This is done to reposition the bones and relieve pain or to remove loose pieces of bone and  cartilage. ? Joint replacement surgery. You may need this surgery if you have very bad (advanced) osteoarthritis. Follow these instructions at home: Activity  Rest your affected joints as directed by your health care provider.  Do not drive or use heavy machinery while taking prescription pain medicine.  Exercise as directed. Your health care provider or physical therapist may recommend specific types of exercise, such as: ? Strengthening exercises. These are done to strengthen the muscles that support joints that are affected by arthritis. They can be performed with weights or with exercise bands to add resistance. ? Aerobic activities. These are exercises, such as brisk walking or water aerobics, that get your heart pumping. ? Range-of-motion activities. These keep your joints easy to move. ? Balance and agility exercises. Managing pain, stiffness, and swelling      If directed, apply heat to the affected area as often as told by your health care provider. Use the heat source that your health care provider recommends, such as a moist heat pack or a heating pad. ? If you have a removable assistive device, remove it as told by your health care provider. ? Place a towel between your skin and the heat source. If your health care provider tells you to keep the assistive device on while you apply heat, place a towel between the assistive device and the heat source. ? Leave the heat on for 20-30 minutes. ? Remove the heat if your skin turns bright red. This is especially important if you are unable to feel pain, heat, or cold. You may have a greater risk of getting burned.  If directed, put ice on the affected joint: ? If you have a removable assistive device, remove it as told by your health care provider. ? Put ice in a plastic bag. ? Place a towel between your skin and the bag. If your health care provider tells you to keep the assistive device on during icing, place a towel between the  assistive device and the bag. ? Leave the ice on for 20 minutes, 2-3 times a day. General instructions  Take over-the-counter and prescription medicines only as told by your health care provider.  Maintain a healthy weight. Follow instructions from your health care provider for weight control. These may include dietary restrictions.  Do not use any products that contain nicotine or tobacco, such as cigarettes and e-cigarettes. These can delay bone healing. If you need help quitting, ask your health care provider.  Use assistive devices as directed by your health care provider.  Keep all follow-up visits as told by your health care provider. This is important. Where to find more information  [employer] of Arthritis and Musculoskeletal and Skin Diseases: www.niams.SouthExposed.es  [employer] on Aging: http://kim-miller.com/  American College of Rheumatology: www.rheumatology.org Contact a health care provider if:  Your skin turns red.  You develop a rash.  You have pain that gets worse.  You have a fever along with joint or muscle aches. Get help right  away if:  You lose a lot of weight.  You suddenly lose your appetite.  You have night sweats. Summary  Osteoarthritis is a type of arthritis that affects tissue covering the ends of bones in joints (cartilage).  This condition is caused by age-related wearing down of cartilage that covers the ends of bones.  The main symptom of this condition is pain, swelling, and stiffness in the joint.  There is no cure for this condition, but treatment can help to control pain and improve joint function. This information is not intended to replace advice given to you by your health care provider. Make sure you discuss any questions you have with your health care provider. Document Released: 01/11/2005 Document Revised: 12/24/2016 Document Reviewed: 09/15/2015 Elsevier Patient Education  2020 [employer].

## 2018-12-28 LAB — CBC WITH DIFFERENTIAL/PLATELET
Absolute Monocytes: 377 cells/uL (ref 200–950)
Basophils Absolute: 18 cells/uL (ref 0–200)
Basophils Relative: 0.4 %
Eosinophils Absolute: 0 cells/uL — ABNORMAL LOW (ref 15–500)
Eosinophils Relative: 0 %
HCT: 38.2 % (ref 35.0–45.0)
Hemoglobin: 12.2 g/dL (ref 11.7–15.5)
Lymphs Abs: 1293 cells/uL (ref 850–3900)
MCH: 25.7 pg — ABNORMAL LOW (ref 27.0–33.0)
MCHC: 31.9 g/dL — ABNORMAL LOW (ref 32.0–36.0)
MCV: 80.4 fL (ref 80.0–100.0)
MPV: 11.4 fL (ref 7.5–12.5)
Monocytes Relative: 8.2 %
Neutro Abs: 2912 cells/uL (ref 1500–7800)
Neutrophils Relative %: 63.3 %
Platelets: 323 10*3/uL (ref 140–400)
RBC: 4.75 10*6/uL (ref 3.80–5.10)
RDW: 13.1 % (ref 11.0–15.0)
Total Lymphocyte: 28.1 %
WBC: 4.6 10*3/uL (ref 3.8–10.8)

## 2018-12-28 LAB — COMPLETE METABOLIC PANEL WITH GFR
AG Ratio: 1.5 (calc) (ref 1.0–2.5)
ALT: 9 U/L (ref 6–29)
AST: 13 U/L (ref 10–35)
Albumin: 4.1 g/dL (ref 3.6–5.1)
Alkaline phosphatase (APISO): 84 U/L (ref 37–153)
BUN: 14 mg/dL (ref 7–25)
CO2: 26 mmol/L (ref 20–32)
Calcium: 9.4 mg/dL (ref 8.6–10.4)
Chloride: 104 mmol/L (ref 98–110)
Creat: 0.62 mg/dL (ref 0.60–0.88)
GFR, Est African American: 98 mL/min/{1.73_m2} (ref >=60)
GFR, Est Non African American: 85 mL/min/{1.73_m2} (ref >=60)
Globulin: 2.7 g/dL (calc) (ref 1.9–3.7)
Glucose, Bld: 110 mg/dL — ABNORMAL HIGH (ref 65–99)
Potassium: 3.8 mmol/L (ref 3.5–5.3)
Sodium: 140 mmol/L (ref 135–146)
Total Bilirubin: 0.3 mg/dL (ref 0.2–1.2)
Total Protein: 6.8 g/dL (ref 6.1–8.1)

## 2018-12-28 LAB — IRON, TOTAL/TOTAL IRON BINDING CAP
%SAT: 12 % (calc) — ABNORMAL LOW (ref 16–45)
Iron: 49 ug/dL (ref 45–160)
TIBC: 424 mcg/dL (calc) (ref 250–450)

## 2018-12-28 LAB — URINALYSIS, ROUTINE W REFLEX MICROSCOPIC
Bacteria, UA: NONE SEEN /HPF
Bilirubin Urine: NEGATIVE
Glucose, UA: NEGATIVE
Hgb urine dipstick: NEGATIVE
Hyaline Cast: NONE SEEN /LPF
Ketones, ur: NEGATIVE
Nitrite: NEGATIVE
Protein, ur: NEGATIVE
RBC / HPF: NONE SEEN /HPF (ref 0–2)
Specific Gravity, Urine: 1.023 (ref 1.001–1.03)
pH: <=5 (ref 5.0–8.0)

## 2018-12-28 LAB — LIPID PANEL
Cholesterol: 229 mg/dL — ABNORMAL HIGH (ref <200)
HDL: 68 mg/dL (ref >=50)
LDL Cholesterol (Calc): 135 mg/dL (calc) — ABNORMAL HIGH
Non-HDL Cholesterol (Calc): 161 mg/dL (calc) — ABNORMAL HIGH (ref <130)
Total CHOL/HDL Ratio: 3.4 (calc) (ref <5.0)
Triglycerides: 136 mg/dL (ref <150)

## 2018-12-28 LAB — FERRITIN: Ferritin: 17 ng/mL (ref 16–288)

## 2018-12-28 LAB — MAGNESIUM: Magnesium: 1.8 mg/dL (ref 1.5–2.5)

## 2018-12-28 LAB — MICROALBUMIN / CREATININE URINE RATIO
Creatinine, Urine: 159 mg/dL (ref 20–275)
Microalb Creat Ratio: 8 mcg/mg creat (ref <30)
Microalb, Ur: 1.2 mg/dL

## 2018-12-28 LAB — HEMOGLOBIN A1C
Hgb A1c MFr Bld: 5.9 % of total Hgb — ABNORMAL HIGH (ref <5.7)
Mean Plasma Glucose: 123 (calc)
eAG (mmol/L): 6.8 (calc)

## 2018-12-28 LAB — TSH: TSH: 2.01 mIU/L (ref 0.40–4.50)

## 2018-12-28 LAB — VITAMIN D 25 HYDROXY (VIT D DEFICIENCY, FRACTURES): Vit D, 25-Hydroxy: 79 ng/mL (ref 30–100)

## 2018-12-29 DIAGNOSIS — Z23 Encounter for immunization: Secondary | ICD-10-CM

## 2019-01-01 NOTE — Progress Notes (Signed)
01/01/2019--PATIENT IS AWARE OF LAB RESULTS. Stephanie Roach [facility]

## 2019-03-26 ENCOUNTER — Other Ambulatory Visit: Payer: Self-pay | Admitting: Internal Medicine

## 2019-03-26 DIAGNOSIS — N6489 Other specified disorders of breast: Secondary | ICD-10-CM

## 2019-04-04 ENCOUNTER — Ambulatory Visit (INDEPENDENT_AMBULATORY_CARE_PROVIDER_SITE_OTHER): Payer: Medicare Other | Admitting: Internal Medicine

## 2019-04-04 ENCOUNTER — Other Ambulatory Visit: Payer: Self-pay

## 2019-04-04 VITALS — BP 116/84 | HR 84 | Temp 97.0°F | Resp 16 | Ht 65.0 in | Wt 149.4 lb

## 2019-04-04 DIAGNOSIS — E782 Mixed hyperlipidemia: Secondary | ICD-10-CM

## 2019-04-04 DIAGNOSIS — D509 Iron deficiency anemia, unspecified: Secondary | ICD-10-CM

## 2019-04-04 DIAGNOSIS — E559 Vitamin D deficiency, unspecified: Secondary | ICD-10-CM

## 2019-04-04 DIAGNOSIS — R0989 Other specified symptoms and signs involving the circulatory and respiratory systems: Secondary | ICD-10-CM

## 2019-04-04 DIAGNOSIS — R7309 Other abnormal glucose: Secondary | ICD-10-CM

## 2019-04-04 DIAGNOSIS — Z79899 Other long term (current) drug therapy: Secondary | ICD-10-CM

## 2019-04-04 NOTE — Progress Notes (Signed)
History of Present Illness:       This very nice 84 y.o. MWF presents for 3 month follow up with labile  HTN, HLD, Pre-Diabetes and Vitamin D Deficiency.       Patient is treated for HTN & BP has been controlled at home. Today's BP isat goal - 116/84. Patient has had no complaints of any cardiac type chest pain, palpitations, dyspnea / orthopnea / PND, dizziness, claudication, or dependent edema.      Hyperlipidemia is not  controlled with diet & meds. Patient denies myalgias or other med SE's. Last Lipids were not at goal:  Lab Results  Component Value Date   CHOL 229 (H) 12/27/2018   HDL 68 12/27/2018   LDLCALC 135 (H) 12/27/2018   TRIG 136 12/27/2018   CHOLHDL 3.4 12/27/2018    Also, the patient has history of PreDiabetes (A1c 6.2% / 2013)  and she has had no symptoms of reactive hypoglycemia, diabetic polys, paresthesias or visual blurring.  Last A1c was not at goal:  Lab Results  Component Value Date   HGBA1C 5.9 (H) 12/27/2018       Further, the patient also has history of Vitamin D Deficiency ("33" / 2013)  and supplements vitamin D without any suspected side-effects. Last vitamin D was at goal:  Lab Results  Component Value Date   VD25OH 79 12/27/2018    Current Outpatient Medications on File Prior to Visit  Medication Sig  . Cholecalciferol (VITAMIN D PO) Take 5,000 Units by mouth daily.   . diclofenac Sodium (VOLTAREN) 1 % GEL Apply 4 g topically 4 (four) times daily.  . Fe Fum-FePoly-FA-Vit C-Vit B3 (INTEGRA F) 125-1 MG CAPS Take 1 capsule by mouth daily.  Marland Kitchen LORazepam (ATIVAN) 1 MG tablet Take 1 mg by mouth every 8 (eight) hours.    Marland Kitchen omeprazole (PRILOSEC) 20 MG capsule Take 1 capsule (20 mg total) by mouth daily.  Marland Kitchen OVER THE COUNTER MEDICATION Takes Tums 2 tablets daily  . traZODone (DESYREL) 50 MG tablet 50 mg at bedtime as needed.   . venlafaxine XR (EFFEXOR-XR) 150 MG 24 hr capsule TAKE 2 CAPSULES AT BEDTIME   Current Facility-Administered  Medications on File Prior to Visit  Medication  . cetirizine (ZYRTEC) tablet 10 mg    Allergies  Allergen Reactions  . Morphine And Related     PMHx:   Past Medical History:  Diagnosis Date  . Allergic rhinitis   . Anemia   . Anxiety   . Arthritis   . Depression   . GERD (gastroesophageal reflux disease)   . Hyperlipidemia   . Hypertension   . Migraine   . Prediabetes   . Sleep apnea   . Vitamin D deficiency     Immunization History  Administered Date(s) Administered  . Influenza, High Dose Seasonal PF 12/29/2018  . Pneumococcal Conjugate-13 08/27/2013  . Pneumococcal Polysaccharide-23 10/23/2015  . Pneumococcal-Unspecified 12/06/2001  . Tdap 08/05/2012  . Zoster 02/05/2010    Past Surgical History:  Procedure Laterality Date  . BUNIONECTOMY    . CATARACT EXTRACTION Bilateral   . CHOLECYSTECTOMY  2003  . KNEE ARTHROSCOPY    . WRIST FRACTURE SURGERY      FHx:    Reviewed / unchanged  SHx:    Reviewed / unchanged   Systems Review:  Constitutional: Denies fever, chills, wt changes, headaches, insomnia, fatigue, night sweats, change in appetite. Eyes: Denies redness, blurred vision, diplopia, discharge, itchy, watery eyes.  ENT: Denies discharge, congestion, post nasal drip, epistaxis, sore throat, earache, hearing loss, dental pain, tinnitus, vertigo, sinus pain, snoring.  CV: Denies chest pain, palpitations, irregular heartbeat, syncope, dyspnea, diaphoresis, orthopnea, PND, claudication or edema. Respiratory: denies cough, dyspnea, DOE, pleurisy, hoarseness, laryngitis, wheezing.  Gastrointestinal: Denies dysphagia, odynophagia, heartburn, reflux, water brash, abdominal pain or cramps, nausea, vomiting, bloating, diarrhea, constipation, hematemesis, melena, hematochezia  or hemorrhoids. Genitourinary: Denies dysuria, frequency, urgency, nocturia, hesitancy, discharge, hematuria or flank pain. Musculoskeletal: Denies arthralgias, myalgias, stiffness, jt.  swelling, pain, limping or strain/sprain.  Skin: Denies pruritus, rash, hives, warts, acne, eczema or change in skin lesion(s). Neuro: No weakness, tremor, incoordination, spasms, paresthesia or pain. Psychiatric: Denies confusion, memory loss or sensory loss. Endo: Denies change in weight, skin or hair change.  Heme/Lymph: No excessive bleeding, bruising or enlarged lymph nodes.  Physical Exam  BP 116/84   Pulse 84   Temp (!) 97 F (36.1 C)   Resp 16   Ht 5\' 5"  (1.651 m)   Wt 149 lb 6.4 oz (67.8 kg)   BMI 24.86 kg/m   Appears  well nourished, well groomed  and in no distress.  Eyes: PERRLA, EOMs, conjunctiva no swelling or erythema. Sinuses: No frontal/maxillary tenderness ENT/Mouth: EAC's clear, TM's nl w/o erythema, bulging. Nares clear w/o erythema, swelling, exudates. Oropharynx clear without erythema or exudates. Oral hygiene is good. Tongue normal, non obstructing. Hearing intact.  Neck: Supple. Thyroid not palpable. Car 2+/2+ without bruits, nodes or JVD. Chest: Respirations nl with BS clear & equal w/o rales, rhonchi, wheezing or stridor.  Cor: Heart sounds normal w/ regular rate and rhythm without sig. murmurs, gallops, clicks or rubs. Peripheral pulses normal and equal  without edema.  Abdomen: Soft & bowel sounds normal. Non-tender w/o guarding, rebound, hernias, masses or organomegaly.  Lymphatics: Unremarkable.  Musculoskeletal: Full ROM all peripheral extremities, joint stability, 5/5 strength and normal gait.  Skin: Warm, dry without exposed rashes, lesions or ecchymosis apparent.  Neuro: Cranial nerves intact, reflexes equal bilaterally. Sensory-motor testing grossly intact. Tendon reflexes grossly intact.  Pysch: Alert & oriented x 3.  Insight and judgement nl & appropriate. No ideations.  Assessment and Plan:  1. Labile hypertension  - Continue medication, monitor blood pressure at home.  - Continue DASH diet.  Reminder to go to the ER if any CP,  SOB,  nausea, dizziness, severe HA, changes vision/speech.  - CBC with Differential/Platelet - COMPLETE METABOLIC PANEL WITH GFR - Magnesium - TSH  2. Hyperlipidemia, mixed  - Continue diet/meds, exercise,& lifestyle modifications.  - Continue monitor periodic cholesterol/liver & renal functions   - Lipid panel - TSH  3. Abnormal glucose  - Continue diet, exercise  - Lifestyle modifications.  - Monitor appropriate labs.  - Hemoglobin A1c - Insulin, random  4. Vitamin D deficiency  - Continue supplementation.  - VITAMIN D 25 Hydroxy  5. Iron deficiency anemia  - CBC with Differential/Platelet  6. Medication management  - CBC with Differential/Platelet - COMPLETE METABOLIC PANEL WITH GFR - Magnesium - Lipid panel - TSH - Hemoglobin A1c - Insulin, random - VITAMIN D 25 Hydroxy        Discussed  regular exercise, BP monitoring, weight control to achieve/maintain BMI less than 25 and discussed med and SE's. Recommended labs to assess and monitor clinical status with further disposition pending results of labs.  I discussed the assessment and treatment plan with the patient. The patient was provided an opportunity to ask questions and all were answered.  The patient agreed with the plan and demonstrated an understanding of the instructions.  I provided over 30 minutes of exam, counseling, chart review and  complex critical decision making.   Kirtland Bouchard, MD

## 2019-04-04 NOTE — Patient Instructions (Signed)

## 2019-04-05 ENCOUNTER — Other Ambulatory Visit: Payer: Self-pay | Admitting: Internal Medicine

## 2019-04-05 LAB — CBC WITH DIFFERENTIAL/PLATELET
Absolute Monocytes: 477 cells/uL (ref 200–950)
Basophils Absolute: 32 cells/uL (ref 0–200)
Basophils Relative: 0.6 %
Eosinophils Absolute: 0 cells/uL — ABNORMAL LOW (ref 15–500)
Eosinophils Relative: 0 %
HCT: 38.5 % (ref 35.0–45.0)
Hemoglobin: 12.6 g/dL (ref 11.7–15.5)
Lymphs Abs: 1240 cells/uL (ref 850–3900)
MCH: 26.3 pg — ABNORMAL LOW (ref 27.0–33.0)
MCHC: 32.7 g/dL (ref 32.0–36.0)
MCV: 80.4 fL (ref 80.0–100.0)
MPV: 10.7 fL (ref 7.5–12.5)
Monocytes Relative: 9 %
Neutro Abs: 3551 cells/uL (ref 1500–7800)
Neutrophils Relative %: 67 %
Platelets: 365 10*3/uL (ref 140–400)
RBC: 4.79 10*6/uL (ref 3.80–5.10)
RDW: 13.8 % (ref 11.0–15.0)
Total Lymphocyte: 23.4 %
WBC: 5.3 10*3/uL (ref 3.8–10.8)

## 2019-04-05 LAB — LIPID PANEL
Cholesterol: 253 mg/dL — ABNORMAL HIGH (ref <200)
HDL: 66 mg/dL (ref >=50)
LDL Cholesterol (Calc): 157 mg/dL (calc) — ABNORMAL HIGH
Non-HDL Cholesterol (Calc): 187 mg/dL (calc) — ABNORMAL HIGH (ref <130)
Total CHOL/HDL Ratio: 3.8 (calc) (ref <5.0)
Triglycerides: 164 mg/dL — ABNORMAL HIGH (ref <150)

## 2019-04-05 LAB — COMPLETE METABOLIC PANEL WITH GFR
AG Ratio: 1.7 (calc) (ref 1.0–2.5)
ALT: 13 U/L (ref 6–29)
AST: 17 U/L (ref 10–35)
Albumin: 4.3 g/dL (ref 3.6–5.1)
Alkaline phosphatase (APISO): 84 U/L (ref 37–153)
BUN: 15 mg/dL (ref 7–25)
CO2: 28 mmol/L (ref 20–32)
Calcium: 9.5 mg/dL (ref 8.6–10.4)
Chloride: 107 mmol/L (ref 98–110)
Creat: 0.6 mg/dL (ref 0.60–0.88)
GFR, Est African American: 99 mL/min/{1.73_m2} (ref >=60)
GFR, Est Non African American: 85 mL/min/{1.73_m2} (ref >=60)
Globulin: 2.6 g/dL (calc) (ref 1.9–3.7)
Glucose, Bld: 106 mg/dL — ABNORMAL HIGH (ref 65–99)
Potassium: 4.3 mmol/L (ref 3.5–5.3)
Sodium: 140 mmol/L (ref 135–146)
Total Bilirubin: 0.4 mg/dL (ref 0.2–1.2)
Total Protein: 6.9 g/dL (ref 6.1–8.1)

## 2019-04-05 LAB — HEMOGLOBIN A1C
Hgb A1c MFr Bld: 6.1 % of total Hgb — ABNORMAL HIGH (ref <5.7)
Mean Plasma Glucose: 128 (calc)
eAG (mmol/L): 7.1 (calc)

## 2019-04-05 LAB — VITAMIN D 25 HYDROXY (VIT D DEFICIENCY, FRACTURES): Vit D, 25-Hydroxy: 71 ng/mL (ref 30–100)

## 2019-04-05 LAB — TSH: TSH: 1.94 mIU/L (ref 0.40–4.50)

## 2019-04-05 LAB — MAGNESIUM: Magnesium: 2.1 mg/dL (ref 1.5–2.5)

## 2019-04-05 LAB — INSULIN, RANDOM: Insulin: 11.1 u[IU]/mL

## 2019-04-05 MED ORDER — ROSUVASTATIN CALCIUM 20 MG PO TABS: ORAL_TABLET | ORAL | 1 refills | Status: DC

## 2019-04-07 ENCOUNTER — Encounter: Payer: Self-pay | Admitting: Internal Medicine

## 2019-04-30 ENCOUNTER — Ambulatory Visit
Admission: RE | Admit: 2019-04-30 | Discharge: 2019-04-30 | Disposition: A | Discharge status: Home / Self Care | Payer: Medicare Other | Source: Ambulatory Visit | Attending: Internal Medicine | Admitting: Internal Medicine

## 2019-04-30 ENCOUNTER — Other Ambulatory Visit: Payer: Self-pay

## 2019-04-30 DIAGNOSIS — N6489 Other specified disorders of breast: Secondary | ICD-10-CM

## 2019-04-30 DIAGNOSIS — N6011 Diffuse cystic mastopathy of right breast: Secondary | ICD-10-CM | POA: Diagnosis not present

## 2019-04-30 DIAGNOSIS — R928 Other abnormal and inconclusive findings on diagnostic imaging of breast: Secondary | ICD-10-CM | POA: Diagnosis not present

## 2019-05-07 ENCOUNTER — Ambulatory Visit: Payer: Medicare Other | Attending: Internal Medicine

## 2019-05-07 DIAGNOSIS — Z23 Encounter for immunization: Secondary | ICD-10-CM

## 2019-05-07 NOTE — Progress Notes (Signed)
   Covid-19 Vaccination Clinic  Name:  JAYVA SZYMKOWIAK    MRN: [id number] DOB: 1935/08/17  05/07/2019  Ms. Willocks was observed post Covid-19 immunization for 15 minutes without incident. She was provided with Vaccine Information Sheet and instruction to access the V-Safe system.   Ms. Elcock was instructed to call 911 with any severe reactions post vaccine: Marland Kitchen Difficulty breathing  . Swelling of face and throat  . A fast heartbeat  . A bad rash all over body  . Dizziness and weakness   Immunizations Administered    Name Date Dose VIS Date Route   Pfizer COVID-19 Vaccine 05/07/2019 11:44 AM 0.3 mL 01/05/2019 Intramuscular   Manufacturer: [place]   Lot: [id number]   [place]: [id number]

## 2019-05-29 ENCOUNTER — Other Ambulatory Visit: Payer: Self-pay | Admitting: Surgery

## 2019-05-29 DIAGNOSIS — R928 Other abnormal and inconclusive findings on diagnostic imaging of breast: Secondary | ICD-10-CM | POA: Diagnosis not present

## 2019-05-30 ENCOUNTER — Ambulatory Visit: Payer: Medicare Other | Attending: Internal Medicine

## 2019-05-30 DIAGNOSIS — Z23 Encounter for immunization: Secondary | ICD-10-CM

## 2019-05-30 NOTE — Progress Notes (Signed)
   Covid-19 Vaccination Clinic  Name:  Stephanie Roach    MRN: [id number] DOB: 1935-05-24  05/30/2019  Stephanie Roach was observed post Covid-19 immunization for 15 minutes without incident. She was provided with Vaccine Information Sheet and instruction to access the V-Safe system.   Stephanie Roach was instructed to call 911 with any severe reactions post vaccine: Marland Kitchen Difficulty breathing  . Swelling of face and throat  . A fast heartbeat  . A bad rash all over body  . Dizziness and weakness   Immunizations Administered    Name Date Dose VIS Date Route   Pfizer COVID-19 Vaccine 05/30/2019 12:34 PM 0.3 mL 03/21/2018 Intramuscular   Manufacturer: [place]   Lot: [id number]   [place]: [id number]

## 2019-06-04 ENCOUNTER — Ambulatory Visit: Payer: Medicare Other

## 2019-06-04 ENCOUNTER — Other Ambulatory Visit: Payer: Self-pay | Admitting: Surgery

## 2019-06-04 DIAGNOSIS — R928 Other abnormal and inconclusive findings on diagnostic imaging of breast: Secondary | ICD-10-CM

## 2019-06-27 NOTE — Progress Notes (Signed)
[facility] [place], [place] [address] Phone: [phone] Fax: [phone]      Your procedure is scheduled on Thursday June 10th.  Report to [facility] Main Entrance "A" at 5:30 A.M., and check in at the Admitting office.  Call this number if you have problems the morning of surgery:  [phone]  Call [phone] if you have any questions prior to your surgery date Monday-Friday 8am-4pm    Remember:  Do not eat after midnight the night before your surgery  You may drink clear liquids until 4:30 am the morning of your surgery.   Clear liquids allowed are: Water, Non-Citrus Juices (without pulp), Carbonated Beverages, Clear Tea, Black Coffee Only, and Gatorade   Enhanced Recovery after Surgery for Orthopedics Enhanced Recovery after Surgery is a protocol used to improve the stress on your body and your recovery after surgery.  Patient Instructions  . The night before surgery:  o No food after midnight. ONLY clear liquids after midnight  .  Marland Kitchen The day of surgery (if you do NOT have diabetes):  o Drink ONE (1) Pre-Surgery Clear Ensure as directed.   o This drink was given to you during your hospital  pre-op appointment visit. o The pre-op nurse will instruct you on the time to drink the  Pre-Surgery Ensure depending on your surgery time. o Finish the drink at the designated time by the pre-op nurse.  o Nothing else to drink after completing the  Pre-Surgery Clear Ensure.         If you have questions, please contact your surgeon's office.     Take these medicines the morning of surgery with A SIP OF WATER   LORazepam (ATIVAN)     As of today, STOP taking any Aspirin (unless otherwise instructed by your surgeon) and Aspirin containing products, Aleve, Naproxen, Ibuprofen, Motrin, Advil, Goody's, BC's, all herbal medications, fish oil, and all vitamins.                      Do not wear jewelry, make  up, or nail polish            Do not wear lotions, powders, perfumes, or deodorant.            Do not shave 48 hours prior to surgery.             Do not bring valuables to the hospital.            [facility] is not responsible for any belongings or valuables.  Do NOT Smoke (Tobacco/Vapping) or drink Alcohol 24 hours prior to your procedure If you use a CPAP at night, you may bring all equipment for your overnight stay.   Contacts, glasses, dentures or bridgework may not be worn into surgery.      For patients admitted to the hospital, discharge time will be determined by your treatment team.   Patients discharged the day of surgery will not be allowed to drive home, and someone needs to stay with them for 24 hours.    Special instructions:   [place]- Preparing For Surgery  Before surgery, you can play an important role. Because skin is not sterile, your skin needs to be as free of germs as possible. You can reduce the number of germs on your skin by washing with CHG (chlorahexidine gluconate) Soap before surgery.  CHG is an antiseptic cleaner which kills germs and  bonds with the skin to continue killing germs even after washing.    Oral Hygiene is also important to reduce your risk of infection.  Remember - BRUSH YOUR TEETH THE MORNING OF SURGERY WITH YOUR REGULAR TOOTHPASTE  Please do not use if you have an allergy to CHG or antibacterial soaps. If your skin becomes reddened/irritated stop using the CHG.  Do not shave (including legs and underarms) for at least 48 hours prior to first CHG shower. It is OK to shave your face.  Please follow these instructions carefully.   1. Shower the NIGHT BEFORE SURGERY and the MORNING OF SURGERY with CHG Soap.   2. If you chose to wash your hair, wash your hair first as usual with your normal shampoo.  3. After you shampoo, rinse your hair and body thoroughly to remove the shampoo.  4. Use CHG as you would any other liquid soap. You can  apply CHG directly to the skin and wash gently with a scrungie or a clean washcloth.   5. Apply the CHG Soap to your body ONLY FROM THE NECK DOWN.  Do not use on open wounds or open sores. Avoid contact with your eyes, ears, mouth and genitals (private parts). Wash Face and genitals (private parts)  with your normal soap.   6. Wash thoroughly, paying special attention to the area where your surgery will be performed.  7. Thoroughly rinse your body with warm water from the neck down.  8. DO NOT shower/wash with your normal soap after using and rinsing off the CHG Soap.  9. Pat yourself dry with a CLEAN TOWEL.  10. Wear CLEAN PAJAMAS to bed the night before surgery, wear comfortable clothes the morning of surgery  11. Place CLEAN SHEETS on your bed the night of your first shower and DO NOT SLEEP WITH PETS.   Day of Surgery:   Do not apply any deodorants/lotions.  Please wear clean clothes to the hospital/surgery center.   Remember to brush your teeth WITH YOUR REGULAR TOOTHPASTE.   Please read over the following fact sheets that you were given.

## 2019-06-28 ENCOUNTER — Encounter (HOSPITAL_COMMUNITY): Payer: Self-pay

## 2019-06-28 ENCOUNTER — Encounter (HOSPITAL_COMMUNITY)
Admission: RE | Admit: 2019-06-28 | Discharge: 2019-06-28 | Disposition: A | Discharge status: Home / Self Care | Payer: Medicare Other | Source: Ambulatory Visit | Attending: Surgery | Admitting: Surgery

## 2019-06-28 ENCOUNTER — Other Ambulatory Visit: Payer: Self-pay

## 2019-06-28 DIAGNOSIS — Z01812 Encounter for preprocedural laboratory examination: Secondary | ICD-10-CM | POA: Diagnosis not present

## 2019-06-28 DIAGNOSIS — R928 Other abnormal and inconclusive findings on diagnostic imaging of breast: Secondary | ICD-10-CM

## 2019-06-28 LAB — CBC
HCT: 40.6 % (ref 36.0–46.0)
Hemoglobin: 12.5 g/dL (ref 12.0–15.0)
MCH: 25.5 pg — ABNORMAL LOW (ref 26.0–34.0)
MCHC: 30.8 g/dL (ref 30.0–36.0)
MCV: 82.7 fL (ref 80.0–100.0)
Platelets: 344 10*3/uL (ref 150–400)
RBC: 4.91 MIL/uL (ref 3.87–5.11)
RDW: 14.2 % (ref 11.5–15.5)
WBC: 5.8 10*3/uL (ref 4.0–10.5)
nRBC: 0 % (ref 0.0–0.2)

## 2019-06-28 LAB — BASIC METABOLIC PANEL
Anion gap: 9 (ref 5–15)
BUN: 12 mg/dL (ref 8–23)
CO2: 24 mmol/L (ref 22–32)
Calcium: 8.9 mg/dL (ref 8.9–10.3)
Chloride: 108 mmol/L (ref 98–111)
Creatinine, Ser: 0.61 mg/dL (ref 0.44–1.00)
GFR calc Af Amer: >60 mL/min (ref >60)
GFR calc non Af Amer: >60 mL/min (ref >60)
Glucose, Bld: 139 mg/dL — ABNORMAL HIGH (ref 70–99)
Potassium: 3.6 mmol/L (ref 3.5–5.1)
Sodium: 141 mmol/L (ref 135–145)

## 2019-06-28 MED ORDER — CHLORHEXIDINE GLUCONATE CLOTH 2 % EX PADS: 6.0000 | MEDICATED_PAD | Freq: Once | CUTANEOUS | Status: DC

## 2019-06-28 MED ORDER — ENSURE PRE-SURGERY PO LIQD: 296.0000 mL | Freq: Once | ORAL | Status: DC

## 2019-06-28 NOTE — Progress Notes (Signed)
PCP - [facility] [place] Cardiologist - NA -   Chest x-ray - NA EKG - 12/20 Stress Test - NA ECHO - NA Cardiac Cath - NA  Sleep Study - YES CPAP - NO   -     Aspirin Instructions:STOP  ERAS Protcol -YES PRE-SURGERY Ensure or G2- INSTRUCTED ON DRINK  COVID TEST- FOR 7TH   Anesthesia review: PAST HTN   NO MEDS NOW  Patient denies shortness of breath, fever, cough and chest pain at PAT appointment   All instructions explained to the patient, with a verbal understanding of the material. Patient agrees to go over the instructions while at home for a better understanding. Patient also instructed to self quarantine after being tested for COVID-19. The opportunity to ask questions was provided.

## 2019-07-02 ENCOUNTER — Other Ambulatory Visit (HOSPITAL_COMMUNITY)
Admission: RE | Admit: 2019-07-02 | Discharge: 2019-07-02 | Disposition: A | Discharge status: Home / Self Care | Payer: Medicare Other | Source: Ambulatory Visit | Attending: Surgery | Admitting: Surgery

## 2019-07-02 DIAGNOSIS — Z20822 Contact with and (suspected) exposure to covid-19: Secondary | ICD-10-CM | POA: Insufficient documentation

## 2019-07-02 DIAGNOSIS — Z01812 Encounter for preprocedural laboratory examination: Secondary | ICD-10-CM | POA: Insufficient documentation

## 2019-07-02 LAB — SARS CORONAVIRUS 2 (TAT 6-24 HRS): SARS Coronavirus 2: NEGATIVE

## 2019-07-04 ENCOUNTER — Other Ambulatory Visit: Payer: Self-pay

## 2019-07-04 ENCOUNTER — Encounter (HOSPITAL_COMMUNITY): Payer: Self-pay | Admitting: Surgery

## 2019-07-04 ENCOUNTER — Ambulatory Visit
Admission: RE | Admit: 2019-07-04 | Discharge: 2019-07-04 | Disposition: A | Discharge status: Home / Self Care | Payer: Medicare Other | Source: Ambulatory Visit | Attending: Surgery | Admitting: Surgery

## 2019-07-04 DIAGNOSIS — R928 Other abnormal and inconclusive findings on diagnostic imaging of breast: Secondary | ICD-10-CM | POA: Diagnosis not present

## 2019-07-04 NOTE — Anesthesia Preprocedure Evaluation (Addendum)
Anesthesia Evaluation  Patient identified by MRN, date of birth, ID band Patient awake    Airway Mallampati: III  TM Distance: >3 FB Neck ROM: Full    Dental no notable dental hx. (+) Teeth Intact, Caps   Pulmonary sleep apnea ,    Pulmonary exam normal breath sounds clear to auscultation       Cardiovascular hypertension, Pt. on medications Normal cardiovascular exam Rhythm:Regular Rate:Normal     Neuro/Psych  Headaches, PSYCHIATRIC DISORDERS Anxiety Depression    GI/Hepatic Neg liver ROS, hiatal hernia, GERD  Medicated and Controlled,  Endo/Other  Right Breast Ca   Renal/GU negative Renal ROS  negative genitourinary   Musculoskeletal  (+) Arthritis , Osteoarthritis,    Abdominal (+) + obese,   Peds  Hematology  (+) anemia ,   Anesthesia Other Findings   Reproductive/Obstetrics                           Anesthesia Physical Anesthesia Plan  ASA: III  Anesthesia Plan: General   Post-op Pain Management:    Induction: Intravenous  PONV Risk Score and Plan: 3 and Treatment may vary due to age or medical condition and Ondansetron  Airway Management Planned: LMA  Additional Equipment:   Intra-op Plan:   Post-operative Plan: Extubation in OR  Informed Consent: I have reviewed the patients History and Physical, chart, labs and discussed the procedure including the risks, benefits and alternatives for the proposed anesthesia with the patient or authorized representative who has indicated his/her understanding and acceptance.     Dental advisory given  Plan Discussed with: CRNA and Anesthesiologist  Anesthesia Plan Comments:        Anesthesia Quick Evaluation

## 2019-07-04 NOTE — H&P (Signed)
Stephanie Roach  Location: [facility] Patient #: [id number] DOB: [birth year] Married / Language: English / Race: White Female   History of Present Illness The patient is a 84 year old female who presents with a complaint of Breast problems.  Chief complaint: Discordant right breast biopsy, abnormal right breast mammogram  This is a 84 year old female referred by the breast center. She is found to have an area of abnormal distortion in the upper outer quadrant of the right breast back in September. She underwent a stereotactic biopsy of this area which was benign. She had a follow-up mammogram and ultrasound several weeks ago. The area remains abnormal in appearance and slightly enlarged. It was felt that the biopsy was discordant compared to the mammogram and ultrasound so surgical referral was made to consider a lumpectomy of this area. She has no family history of breast cancer. She denies nipple discharge. She has no previous history of breast biopsies. She is otherwise healthy without complaints.   Past Surgical History Malachi Bonds, CMA;  Breast Biopsy  Left. Cataract Surgery  Bilateral. Colon Polyp Removal - Colonoscopy  Colon Polyp Removal - Open  Foot Surgery  Bilateral. Gallbladder Surgery - Laparoscopic  Gallbladder Surgery - Open  Knee Surgery  Left.  Diagnostic Studies History Malachi Bonds, CMA) Mammogram  within last year Pap Smear  >5 years ago  Allergies Malachi Bonds, CMA No Known Drug Allergies  [05/29/2019]:  Medication History (Chemira Jones, CMA; LORazepam (1MG  Tablet, Oral) Active. Folivane-F (125-1MG  Capsule, Oral) Active. Medications Reconciled  Social History Malachi Bonds, CMA; Caffeine use  Carbonated beverages, Tea. No alcohol use  No drug use  Tobacco use  Never smoker.  Family History Malachi Bonds, CMA; Alcohol Abuse  Son. Cerebrovascular Accident  Brother. Colon Polyps  Father,  Sister. Depression  Mother. Ischemic Bowel Disease  Father. Respiratory Condition  Sister.  Pregnancy / Birth History Malachi Bonds, CMA;  Age at menarche  9 years. Age of menopause  24-55 Gravida  3 Length (months) of breastfeeding  7-12 Maternal age  35-25 Para  69  Other Problems Malachi Bonds, CMA; Anxiety Disorder  Cholelithiasis  Depression  Hypercholesterolemia     Review of Systems Malachi Bonds CMA General Not Present- Appetite Loss, Chills, Fatigue, Fever, Night Sweats, Weight Gain and Weight Loss. Skin Not Present- Change in Wart/Mole, Dryness, Hives, Jaundice, New Lesions, Non-Healing Wounds, Rash and Ulcer. HEENT Not Present- Earache, Hearing Loss, Hoarseness, Nose Bleed, Oral Ulcers, Ringing in the Ears, Seasonal Allergies, Sinus Pain, Sore Throat, Visual Disturbances, Wears glasses/contact lenses and Yellow Eyes. Respiratory Not Present- Bloody sputum, Chronic Cough, Difficulty Breathing, Snoring and Wheezing. Breast Present- Breast Mass and Breast Pain. Not Present- Nipple Discharge and Skin Changes. Cardiovascular Not Present- Chest Pain, Difficulty Breathing Lying Down, Leg Cramps, Palpitations, Rapid Heart Rate, Shortness of Breath and Swelling of Extremities. Gastrointestinal Not Present- Abdominal Pain, Bloating, Bloody Stool, Change in Bowel Habits, Chronic diarrhea, Constipation, Difficulty Swallowing, Excessive gas, Gets full quickly at meals, Hemorrhoids, Indigestion, Nausea, Rectal Pain and Vomiting. Female Genitourinary Not Present- Frequency, Nocturia, Painful Urination, Pelvic Pain and Urgency. Musculoskeletal Not Present- Back Pain, Joint Pain, Joint Stiffness, Muscle Pain, Muscle Weakness and Swelling of Extremities. Neurological Not Present- Decreased Memory, Fainting, Headaches, Numbness, Seizures, Tingling, Tremor, Trouble walking and Weakness. Psychiatric Not Present- Anxiety, Bipolar, Change in Sleep Pattern, Depression, Fearful and  Frequent crying. Endocrine Not Present- Cold Intolerance, Excessive Hunger, Hair Changes, Heat Intolerance, Hot flashes and New Diabetes. Hematology Not Present- Blood Thinners, Easy  Bruising, Excessive bleeding, Gland problems, HIV and Persistent Infections.  Vitals (Chemira Jones CMA; 05/29/2019 11:38 AM Weight: 150.2 lb Height: 65in Body Surface Area: 1.75 m Body Mass Index: 24.99 kg/m  Pulse: 101 (Regular)  BP: 132/78(Sitting, Left Arm, Standard)       Physical Exam (Makiyah Zentz A. Ninfa Linden MD;  The physical exam findings are as follows: Note: She is normal in appearance on exam  There is no palpable masses in her breast on the right side. There is no axillary adenopathy or supraclavicular adenopathy. The nipple areolar complex is normal in both breasts. There are no left breast masses.    Assessment & Plan  ABNORMAL MAMMOGRAM OF RIGHT BREAST (R92.8)  Impression: This is a patient with an architectural distortion and discordant biopsy in the upper outer quadrant of the right breast. I discussed this with the patient and her husband. I have extensively reviewed her pathology results and her mammograms and ultrasounds from September of last year and in April of this year. I have given him a copy of the pathology results that showed a progression metaplasia and fibrocystic changes. I had a long discussion with the patient and her husband regarding the need for excision of this area for complete histologic evaluation to rule out malignancy. I discussed proceeding with a radioactive seed guided right breast lumpectomy. I discussed the surgical procedure in detail. I discussed the risks which includes but is not limited to bleeding, infection, injury to surrounding structures, the need for further procedures if malignancy is found, cardiopulmonary issues, postoperative recovery, etc. After long discussion, she wished to proceed with surgery which will be scheduled.

## 2019-07-05 ENCOUNTER — Ambulatory Visit
Admission: RE | Admit: 2019-07-05 | Discharge: 2019-07-05 | Disposition: A | Discharge status: Home / Self Care | Payer: Medicare Other | Source: Ambulatory Visit | Attending: Surgery | Admitting: Surgery

## 2019-07-05 ENCOUNTER — Ambulatory Visit (HOSPITAL_COMMUNITY)
Admission: RE | Admit: 2019-07-05 | Discharge: 2019-07-05 | Disposition: A | Discharge status: Home / Self Care | Payer: Medicare Other | Attending: Surgery | Admitting: Surgery

## 2019-07-05 ENCOUNTER — Encounter (HOSPITAL_COMMUNITY)
Admission: RE | Disposition: A | Discharge status: Home / Self Care | Payer: Self-pay | Source: Home / Self Care | Attending: Surgery

## 2019-07-05 ENCOUNTER — Other Ambulatory Visit: Payer: Self-pay

## 2019-07-05 ENCOUNTER — Encounter (HOSPITAL_COMMUNITY): Payer: Self-pay | Admitting: Surgery

## 2019-07-05 ENCOUNTER — Ambulatory Visit (HOSPITAL_COMMUNITY): Payer: Medicare Other | Admitting: Anesthesiology

## 2019-07-05 ENCOUNTER — Ambulatory Visit (HOSPITAL_COMMUNITY): Payer: Medicare Other | Admitting: Physician Assistant

## 2019-07-05 DIAGNOSIS — I10 Essential (primary) hypertension: Secondary | ICD-10-CM | POA: Diagnosis not present

## 2019-07-05 DIAGNOSIS — E782 Mixed hyperlipidemia: Secondary | ICD-10-CM | POA: Diagnosis not present

## 2019-07-05 DIAGNOSIS — G4730 Sleep apnea, unspecified: Secondary | ICD-10-CM | POA: Insufficient documentation

## 2019-07-05 DIAGNOSIS — N6011 Diffuse cystic mastopathy of right breast: Secondary | ICD-10-CM | POA: Diagnosis not present

## 2019-07-05 DIAGNOSIS — F419 Anxiety disorder, unspecified: Secondary | ICD-10-CM | POA: Diagnosis not present

## 2019-07-05 DIAGNOSIS — N6021 Fibroadenosis of right breast: Secondary | ICD-10-CM | POA: Diagnosis not present

## 2019-07-05 DIAGNOSIS — R928 Other abnormal and inconclusive findings on diagnostic imaging of breast: Secondary | ICD-10-CM

## 2019-07-05 DIAGNOSIS — R921 Mammographic calcification found on diagnostic imaging of breast: Secondary | ICD-10-CM | POA: Diagnosis not present

## 2019-07-05 DIAGNOSIS — R7303 Prediabetes: Secondary | ICD-10-CM | POA: Diagnosis not present

## 2019-07-05 HISTORY — PX: BREAST EXCISIONAL BIOPSY: SUR124

## 2019-07-05 HISTORY — PX: BREAST LUMPECTOMY WITH RADIOACTIVE SEED LOCALIZATION: SHX6424

## 2019-07-05 LAB — NO BLOOD PRODUCTS

## 2019-07-05 LAB — GLUCOSE, CAPILLARY: Glucose-Capillary: 145 mg/dL — ABNORMAL HIGH (ref 70–99)

## 2019-07-05 SURGERY — BREAST LUMPECTOMY WITH RADIOACTIVE SEED LOCALIZATION
Anesthesia: General | Site: Breast | Laterality: Right

## 2019-07-05 MED ORDER — PROPOFOL 10 MG/ML IV BOLUS
INTRAVENOUS | Status: AC
  Filled 2019-07-05: qty 20

## 2019-07-05 MED ORDER — DEXAMETHASONE SODIUM PHOSPHATE 10 MG/ML IJ SOLN
INTRAMUSCULAR | Status: DC | PRN
Start: 2019-07-05 — End: 2019-07-05
  Administered 2019-07-05: 6 mg via INTRAVENOUS

## 2019-07-05 MED ORDER — FENTANYL CITRATE (PF) 100 MCG/2ML IJ SOLN
25.0000 ug | INTRAMUSCULAR | Status: DC | PRN
Start: 2019-07-05 — End: 2019-07-05

## 2019-07-05 MED ORDER — EPHEDRINE 5 MG/ML INJ
INTRAVENOUS | Status: AC
  Filled 2019-07-05: qty 10

## 2019-07-05 MED ORDER — GLYCOPYRROLATE PF 0.2 MG/ML IJ SOSY
PREFILLED_SYRINGE | INTRAMUSCULAR | Status: DC | PRN
  Administered 2019-07-05: .1 mg via INTRAVENOUS

## 2019-07-05 MED ORDER — BUPIVACAINE HCL (PF) 0.25 % IJ SOLN
INTRAMUSCULAR | Status: AC
  Filled 2019-07-05: qty 30

## 2019-07-05 MED ORDER — TRAMADOL HCL 50 MG PO TABS: 50.0000 mg | ORAL_TABLET | Freq: Four times a day (QID) | ORAL | 0 refills | Status: DC | PRN

## 2019-07-05 MED ORDER — PROPOFOL 10 MG/ML IV BOLUS
INTRAVENOUS | Status: DC | PRN
  Administered 2019-07-05: 120 mg via INTRAVENOUS

## 2019-07-05 MED ORDER — PHENYLEPHRINE 40 MCG/ML (10ML) SYRINGE FOR IV PUSH (FOR BLOOD PRESSURE SUPPORT)
PREFILLED_SYRINGE | INTRAVENOUS | Status: DC | PRN
  Administered 2019-07-05: 120 ug via INTRAVENOUS
  Administered 2019-07-05 (×2): 80 ug via INTRAVENOUS

## 2019-07-05 MED ORDER — STERILE WATER FOR IRRIGATION IR SOLN
Status: DC | PRN
  Administered 2019-07-05: 1000 mL

## 2019-07-05 MED ORDER — 0.9 % SODIUM CHLORIDE (POUR BTL) OPTIME
TOPICAL | Status: DC | PRN
  Administered 2019-07-05: 1000 mL

## 2019-07-05 MED ORDER — ONDANSETRON HCL 4 MG/2ML IJ SOLN
4.0000 mg | Freq: Once | INTRAMUSCULAR | Status: AC | PRN
  Administered 2019-07-05: 4 mg via INTRAVENOUS

## 2019-07-05 MED ORDER — PHENYLEPHRINE HCL-NACL 10-0.9 MG/250ML-% IV SOLN
INTRAVENOUS | Status: DC | PRN
  Administered 2019-07-05: 60 ug/min via INTRAVENOUS

## 2019-07-05 MED ORDER — LACTATED RINGERS IV SOLN: INTRAVENOUS | Status: DC | PRN

## 2019-07-05 MED ORDER — GABAPENTIN 300 MG PO CAPS
300.0000 mg | ORAL_CAPSULE | ORAL | Status: AC
  Administered 2019-07-05: 300 mg via ORAL
  Filled 2019-07-05: qty 1

## 2019-07-05 MED ORDER — ONDANSETRON HCL 4 MG/2ML IJ SOLN
INTRAMUSCULAR | Status: AC
  Filled 2019-07-05: qty 2

## 2019-07-05 MED ORDER — GLYCOPYRROLATE PF 0.2 MG/ML IJ SOSY
PREFILLED_SYRINGE | INTRAMUSCULAR | Status: AC
  Filled 2019-07-05: qty 1

## 2019-07-05 MED ORDER — BUPIVACAINE HCL (PF) 0.25 % IJ SOLN
INTRAMUSCULAR | Status: DC | PRN
  Administered 2019-07-05: 15 mL

## 2019-07-05 MED ORDER — LIDOCAINE 2% (20 MG/ML) 5 ML SYRINGE
INTRAMUSCULAR | Status: DC | PRN
  Administered 2019-07-05: 60 mg via INTRAVENOUS

## 2019-07-05 MED ORDER — CEFAZOLIN SODIUM-DEXTROSE 2-4 GM/100ML-% IV SOLN
2.0000 g | INTRAVENOUS | Status: AC
  Administered 2019-07-05: 2 g via INTRAVENOUS
  Filled 2019-07-05: qty 100

## 2019-07-05 MED ORDER — CHLORHEXIDINE GLUCONATE 0.12 % MT SOLN
15.0000 mL | Freq: Once | OROMUCOSAL | Status: AC
  Administered 2019-07-05: 15 mL via OROMUCOSAL
  Filled 2019-07-05: qty 15

## 2019-07-05 MED ORDER — DEXAMETHASONE SODIUM PHOSPHATE 10 MG/ML IJ SOLN
INTRAMUSCULAR | Status: AC
  Filled 2019-07-05: qty 1

## 2019-07-05 MED ORDER — LIDOCAINE 2% (20 MG/ML) 5 ML SYRINGE
INTRAMUSCULAR | Status: AC
  Filled 2019-07-05: qty 5

## 2019-07-05 MED ORDER — FENTANYL CITRATE (PF) 250 MCG/5ML IJ SOLN
INTRAMUSCULAR | Status: AC
  Filled 2019-07-05: qty 5

## 2019-07-05 MED ORDER — FENTANYL CITRATE (PF) 100 MCG/2ML IJ SOLN
INTRAMUSCULAR | Status: DC | PRN
  Administered 2019-07-05: 75 ug via INTRAVENOUS
  Administered 2019-07-05: 25 ug via INTRAVENOUS

## 2019-07-05 MED ORDER — ORAL CARE MOUTH RINSE: 15.0000 mL | Freq: Once | OROMUCOSAL | Status: AC

## 2019-07-05 MED ORDER — ONDANSETRON HCL 4 MG/2ML IJ SOLN
INTRAMUSCULAR | Status: DC | PRN
  Administered 2019-07-05: 4 mg via INTRAVENOUS

## 2019-07-05 MED ORDER — EPHEDRINE SULFATE-NACL 50-0.9 MG/10ML-% IV SOSY
PREFILLED_SYRINGE | INTRAVENOUS | Status: DC | PRN
  Administered 2019-07-05 (×2): 5 mg via INTRAVENOUS

## 2019-07-05 MED ORDER — ACETAMINOPHEN 500 MG PO TABS
1000.0000 mg | ORAL_TABLET | ORAL | Status: AC
  Administered 2019-07-05: 1000 mg via ORAL
  Filled 2019-07-05: qty 2

## 2019-07-05 MED ORDER — PHENYLEPHRINE 40 MCG/ML (10ML) SYRINGE FOR IV PUSH (FOR BLOOD PRESSURE SUPPORT)
PREFILLED_SYRINGE | INTRAVENOUS | Status: AC
  Filled 2019-07-05: qty 10

## 2019-07-05 SURGICAL SUPPLY — 33 items
APPLIER CLIP 9.375 MED OPEN (MISCELLANEOUS) ×3
BINDER BREAST LRG (GAUZE/BANDAGES/DRESSINGS) ×3 IMPLANT
BINDER BREAST XLRG (GAUZE/BANDAGES/DRESSINGS) IMPLANT
CANISTER SUCT 3000ML PPV (MISCELLANEOUS) ×3 IMPLANT
CHLORAPREP W/TINT 26 (MISCELLANEOUS) ×3 IMPLANT
CLIP APPLIE 9.375 MED OPEN (MISCELLANEOUS) ×1 IMPLANT
COVER PROBE W GEL 5X96 (DRAPES) ×3 IMPLANT
COVER SURGICAL LIGHT HANDLE (MISCELLANEOUS) ×3 IMPLANT
COVER WAND RF STERILE (DRAPES) IMPLANT
DERMABOND ADVANCED (GAUZE/BANDAGES/DRESSINGS) ×2
DERMABOND ADVANCED .7 DNX12 (GAUZE/BANDAGES/DRESSINGS) ×1 IMPLANT
DEVICE DUBIN SPECIMEN MAMMOGRA (MISCELLANEOUS) ×3 IMPLANT
DRAPE CHEST BREAST 15X10 FENES (DRAPES) ×3 IMPLANT
ELECT CAUTERY BLADE 6.4 (BLADE) ×3 IMPLANT
ELECT REM PT RETURN 9FT ADLT (ELECTROSURGICAL) ×3
ELECTRODE REM PT RTRN 9FT ADLT (ELECTROSURGICAL) ×1 IMPLANT
GLOVE SURG SIGNA 7.5 PF LTX (GLOVE) ×3 IMPLANT
GOWN STRL REUS W/ TWL LRG LVL3 (GOWN DISPOSABLE) ×2 IMPLANT
GOWN STRL REUS W/ TWL XL LVL3 (GOWN DISPOSABLE) ×1 IMPLANT
GOWN STRL REUS W/TWL LRG LVL3 (GOWN DISPOSABLE) ×6
GOWN STRL REUS W/TWL XL LVL3 (GOWN DISPOSABLE) ×3
KIT BASIN OR (CUSTOM PROCEDURE TRAY) ×3 IMPLANT
KIT MARKER MARGIN INK (KITS) ×3 IMPLANT
NEEDLE HYPO 25GX1X1/2 BEV (NEEDLE) ×3 IMPLANT
NS IRRIG 1000ML POUR BTL (IV SOLUTION) ×3 IMPLANT
PACK GENERAL/GYN (CUSTOM PROCEDURE TRAY) ×3 IMPLANT
PAD ABD 8X10 STRL (GAUZE/BANDAGES/DRESSINGS) ×3 IMPLANT
PENCIL BUTTON HOLSTER BLD 10FT (ELECTRODE) ×3 IMPLANT
SUT MNCRL AB 4-0 PS2 18 (SUTURE) ×3 IMPLANT
SUT VIC AB 3-0 SH 18 (SUTURE) ×3 IMPLANT
SYR CONTROL 10ML LL (SYRINGE) ×3 IMPLANT
TOWEL GREEN STERILE (TOWEL DISPOSABLE) ×3 IMPLANT
TOWEL GREEN STERILE FF (TOWEL DISPOSABLE) ×3 IMPLANT

## 2019-07-05 NOTE — Op Note (Signed)
RIGHT BREAST LUMPECTOMY WITH RADIOACTIVE SEED LOCALIZATION  Procedure Note  Stephanie Roach 07/05/2019   Pre-op Diagnosis: RIGHT BREAST ABNORMAL MAMMOGRAM     Post-op Diagnosis: same  Procedure(s): RIGHT BREAST LUMPECTOMY WITH RADIOACTIVE SEED LOCALIZATION  Surgeon(s): Coralie Keens, MD  Anesthesia: General  Staff:  Circulator: Jeanie Cooks, RN Scrub Person: Lois Huxley Circulator Assistant: Rozell Searing, RN; Celene Squibb, RN  Estimated Blood Loss: Minimal               Specimens: sent to path  Indications: This is an 84 year old female who was found to have an abnormality in the right breast on screening mammography.  She underwent a biopsy of this area which was benign but felt to be discordant.  The decision was made to proceed with a radioactive seed guided right breast lumpectomy to remove this area for complete histologic evaluation.  Procedure: The patient was brought to the operating room and identifies correct patient.  She was placed upon on the operating room table and general anesthesia was induced.  Her right breast was prepped and draped in usual sterile fashion.  I located the seed in the upper outer quadrant of the breast with the neoprobe.  I then anesthetized the edge of the areola laterally with Marcaine.  I then made an incision with a scalpel.  I then dissected toward the radioactive seed with the aid of the neoprobe and the cautery.  I then performed a lumpectomy staying widely around the seed with the cautery.  I then completed the lumpectomy.  I marked the margins with pain.  I x-rayed the specimen confirming that the seed and previous tissue marker were in the specimen.  The lumpectomy specimen was then sent to pathology for evaluation.  I achieved hemostasis with the cautery.  I anesthetized the wound further with Marcaine.  I placed a surgical clip at the biopsy cavity.  I then closed the subcutaneous tissue with interrupted 3-0  Vicryl sutures and closed the skin with a running 4-0 Monocryl.  Dermabond and a breast binder were then applied.  The patient tolerated the procedure well.  All the counts were correct at the end of the procedure.  The patient was then extubated in the operating room and taken in stable condition to the recovery room.          Coralie Keens   Date: 07/05/2019  Time: 8:04 AM

## 2019-07-05 NOTE — Anesthesia Postprocedure Evaluation (Addendum)
Anesthesia Post Note  Patient: Stephanie Roach  Procedure(s) Performed: RIGHT BREAST LUMPECTOMY WITH RADIOACTIVE SEED LOCALIZATION (Right Breast)     Patient location during evaluation: PACU Anesthesia Type: General Level of consciousness: awake and alert and oriented Pain management: pain level controlled Vital Signs Assessment: post-procedure vital signs reviewed and stable Respiratory status: spontaneous breathing, nonlabored ventilation and respiratory function stable Cardiovascular status: blood pressure returned to baseline and stable Postop Assessment: no apparent nausea or vomiting Anesthetic complications: no   No complications documented.  Last Vitals:  Vitals:   07/05/19 0830 07/05/19 0840  BP: 133/79   Pulse: 92 94  Resp: 13 (!) 22  Temp: 36.4 C   SpO2: 96% 99%    Last Pain:  Vitals:   07/05/19 0830  TempSrc:   PainSc: 0-No pain                 [name] A.

## 2019-07-05 NOTE — Anesthesia Procedure Notes (Addendum)
Procedure Name: LMA Insertion Date/Time: 07/05/2019 7:32 AM Performed by: Orlie Dakin, CRNA Pre-anesthesia Checklist: Patient identified, Emergency Drugs available, Suction available and Patient being monitored Patient Re-evaluated:Patient Re-evaluated prior to induction Oxygen Delivery Method: Circle system utilized Preoxygenation: Pre-oxygenation with 100% oxygen Induction Type: IV induction LMA: LMA inserted LMA Size: 4.0 Tube type: Oral Number of attempts: 1 Placement Confirmation: positive ETCO2 and breath sounds checked- equal and bilateral Tube secured with: Tape Dental Injury: Teeth and Oropharynx as per pre-operative assessment

## 2019-07-05 NOTE — Discharge Instructions (Signed)
[place] Office Phone Number [phone]  BREAST BIOPSY/ PARTIAL MASTECTOMY: POST OP INSTRUCTIONS  Always review your discharge instruction sheet given to you by the facility where your surgery was performed.  IF YOU HAVE DISABILITY OR FAMILY LEAVE FORMS, YOU MUST BRING THEM TO THE OFFICE FOR PROCESSING.  DO NOT GIVE THEM TO YOUR DOCTOR.  1. A prescription for pain medication may be given to you upon discharge.  Take your pain medication as prescribed, if needed.  If narcotic pain medicine is not needed, then you may take acetaminophen (Tylenol) or ibuprofen (Advil) as needed. 2. Take your usually prescribed medications unless otherwise directed 3. If you need a refill on your pain medication, please contact your pharmacy.  They will contact our office to request authorization.  Prescriptions will not be filled after 5pm or on week-ends. 4. You should eat very light the first 24 hours after surgery, such as soup, crackers, pudding, etc.  Resume your normal diet the day after surgery. 5. Most patients will experience some swelling and bruising in the breast.  Ice packs and a good support bra will help.  Swelling and bruising can take several days to resolve.  6. It is common to experience some constipation if taking pain medication after surgery.  Increasing fluid intake and taking a stool softener will usually help or prevent this problem from occurring.  A mild laxative (Milk of Magnesia or Miralax) should be taken according to package directions if there are no bowel movements after 48 hours. 7. Unless discharge instructions indicate otherwise, you may remove your bandages 24-48 hours after surgery, and you may shower at that time.  You may have steri-strips (small skin tapes) in place directly over the incision.  These strips should be left on the skin for 7-10 days.  If your surgeon used skin glue on the incision, you may shower in 24 hours.  The glue will flake off over the  next 2-3 weeks.  Any sutures or staples will be removed at the office during your follow-up visit. 8. ACTIVITIES:  You may resume regular daily activities (gradually increasing) beginning the next day.  Wearing a good support bra or sports bra minimizes pain and swelling.  You may have sexual intercourse when it is comfortable. a. You may drive when you no longer are taking prescription pain medication, you can comfortably wear a seatbelt, and you can safely maneuver your car and apply brakes. b. RETURN TO WORK:  ______________________________________________________________________________________ 9. You should see your doctor in the office for a follow-up appointment approximately two weeks after your surgery.  Your doctor's nurse will typically make your follow-up appointment when she calls you with your pathology report.  Expect your pathology report 2-3 business days after your surgery.  You may call to check if you do not hear from [place] after three days. 10. OTHER INSTRUCTIONS:OK TO REMOVE BINDER AND SHOWER STARTING TOMORROW 11. ICE PACK, TYLENOL, AND IBUPROFEN ALSO FOR PAIN 12. NO VIGOROUS ACTIVITY FOR ONE WEEK _______________________________________________________________________________________________ _____________________________________________________________________________________________________________________________________ _____________________________________________________________________________________________________________________________________ _____________________________________________________________________________________________________________________________________  WHEN TO CALL YOUR DOCTOR: 1. Fever over 101.0 2. Nausea and/or vomiting. 3. Extreme swelling or bruising. 4. Continued bleeding from incision. 5. Increased pain, redness, or drainage from the incision.  The clinic staff is available to answer your questions during regular business hours.  Please  don't hesitate to call and ask to speak to one of the nurses for clinical concerns.  If you have a medical emergency, go to the nearest emergency room or call  911.  A surgeon from [facility] is always on call at the hospital.  For further questions, please visit [email or web address]

## 2019-07-05 NOTE — Interval H&P Note (Signed)
History and Physical Interval Note:no change in H and P  07/05/2019 7:12 AM  Stephanie Roach  has presented today for surgery, with the diagnosis of RIGHT BREAST ABNORMAL MAMMOGRAM.  The various methods of treatment have been discussed with the patient and family. After consideration of risks, benefits and other options for treatment, the patient has consented to  Procedure(s): RIGHT BREAST LUMPECTOMY WITH RADIOACTIVE SEED LOCALIZATION (Right) as a surgical intervention.  The patient's history has been reviewed, patient examined, no change in status, stable for surgery.  I have reviewed the patient's chart and labs.  Questions were answered to the patient's satisfaction.     Coralie Keens

## 2019-07-05 NOTE — Transfer of Care (Signed)
Immediate Anesthesia Transfer of Care Note  Patient: Stephanie Roach  Procedure(s) Performed: RIGHT BREAST LUMPECTOMY WITH RADIOACTIVE SEED LOCALIZATION (Right Breast)  Patient Location: PACU  Anesthesia Type:General  Level of Consciousness: drowsy  Airway & Oxygen Therapy: Patient Spontanous Breathing and Patient connected to face mask oxygen  Post-op Assessment: Report given to RN and Post -op Vital signs reviewed and stable  Post vital signs: Reviewed and stable  Last Vitals:  Vitals Value Taken Time  BP 126/65 07/05/19 0813  Temp    Pulse 78 07/05/19 0812  Resp    SpO2 100 % 07/05/19 0812  Vitals shown include unvalidated device data.  Last Pain:  Vitals:   07/05/19 0629  TempSrc:   PainSc: 0-No pain         Complications: No complications documented.

## 2019-07-06 ENCOUNTER — Encounter (HOSPITAL_COMMUNITY): Payer: Self-pay | Admitting: Surgery

## 2019-07-08 ENCOUNTER — Other Ambulatory Visit: Payer: Self-pay | Admitting: Physician Assistant

## 2019-07-08 DIAGNOSIS — D508 Other iron deficiency anemias: Secondary | ICD-10-CM

## 2019-07-09 LAB — SURGICAL PATHOLOGY

## 2019-09-17 DIAGNOSIS — Z9889 Other specified postprocedural states: Secondary | ICD-10-CM | POA: Insufficient documentation

## 2019-09-17 NOTE — Progress Notes (Signed)
MEDICARE WELLNESS AND 3 MONTH FOLLOW  Assessment:   Other iron deficiency anemia -     Iron,Total/Total Iron Binding Cap -     Ferritin  Chronic pain of both knees -   Monitor   Labile hypertension - continue medications, DASH diet, exercise and monitor at home. Call if greater than 130/80.  -     CBC with Differential/Platelet -     COMPLETE METABOLIC PANEL WITH GFR -     TSH  Hyperlipidemia, mixed check lipids decrease fatty foods increase activity.  -     Lipid panel  Medication management  Vitamin D deficiency  Encounter for Medicare annual wellness exam 1 YEAR  Chronic anxiety -continue medications, stress management techniques discussed, increase water, good sleep hygiene discussed, increase exercise, and increase veggies.   Abnormal glucose Discussed disease progression and risks Discussed diet/exercise, weight management and risk modification -     Hemoglobin A1c  Hx of adenomatous colonic polyps  Osteoporosis, unspecified osteoporosis type, unspecified pathological fracture presence continue Vit D and Ca, weight bearing exercises  Hiatal hernia  BMI 24.0-24.9, adult  S/P lumpectomy, right breast Benign, will  Proceed with follow up MGM but then discussed need for further testing    Future Appointments  Date Time Provider [place]  12/27/2019  3:00 PM Vicie Mutters, PA-C [id number] None    Medicare Attestation I have personally reviewed: The patient's medical and social history Their use of alcohol, tobacco or illicit drugs Their current medications and supplements The patient's functional ability including ADLs,fall risks, home safety risks, cognitive, and hearing and visual impairment Diet and physical activities Evidence for depression or mood disorders  The patient's weight, height, BMI, and visual acuity have been recorded in the chart.  I have made referrals, counseling, and provided education to the patient based on review of  the above and I have provided the patient with a written personalized care plan for preventive services.    MEDICARE WELLNESS OBJECTIVES: Physical activity: Current Exercise Habits: The patient does not participate in regular exercise at present, Exercise limited by: None identified Cardiac risk factors: Cardiac Risk Factors include: advanced age (>51men, >29 women);dyslipidemia;hypertension;sedentary lifestyle Depression/mood screen:   Depression screen [facility] 2/9 09/19/2019  Decreased Interest 0  Down, Depressed, Hopeless 0  PHQ - 2 Score 0  Altered sleeping -  Tired, decreased energy -  Change in appetite -  Feeling bad or failure about yourself  -  Trouble concentrating -  Moving slowly or fidgety/restless -  Suicidal thoughts -  PHQ-9 Score -  Difficult doing work/chores -    ADLs:  In your present state of health, do you have any difficulty performing the following activities: 09/19/2019 06/28/2019  Hearing? N Y  Vision? N N  Difficulty concentrating or making decisions? N N  Walking or climbing stairs? N N  Dressing or bathing? N N  Doing errands, shopping? N N  Some recent data might be hidden     Cognitive Testing  Alert? Yes  Normal Appearance?Yes  Oriented to person? Yes  Place? Yes   Time? Yes  Recall of three objects?  Yes  Can perform simple calculations? Yes  Displays appropriate judgment?Yes  Can read the correct time from a watch face?Yes  EOL planning: Does Patient Have a Medical Advance Directive?: Yes Does patient want to make changes to medical advance directive?: No - Patient declined      Subjective:   Stephanie Roach is a 84 y.o. female who  presents for medicare wellness and follow up for chol, preDM, and HTN. Marland Kitchen    Her blood pressure has been controlled at home, today their BP is BP: 120/86 She does not workout.  She denies chest pain,dizziness.   Patient had right breast lumpectomy with Dr. Ninfa Linden 07/05/19 due to abnormal MGM and pathology was  benign  Husband, charles diagnosed with parkinson's and he is declining very quickly, she was moving him and pulled something on her right back 3 months ago. She does not have much help with him, her nephews will help mow their grass and get groceries.  She is on effexor 150mg  a day which helps.   She has dental infection, going to get on amoxicillin and will need root canal.   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 Olevia Perches) which lead to iron def anemia, she is not on PPI at this time, she is not on any NSAIDS. She is on integra and following with Dr. Julien Nordmann. Lab Results  Component Value Date   IRON 49 12/27/2018   TIBC 424 12/27/2018   FERRITIN 17 12/27/2018     She is not on cholesterol medication. Her cholesterol is not at goal. The cholesterol last visit was:   Lab Results  Component Value Date   CHOL 253 (H) 04/04/2019   HDL 66 04/04/2019   LDLCALC 157 (H) 04/04/2019   TRIG 164 (H) 04/04/2019   CHOLHDL 3.8 04/04/2019    She has been working on diet and exercise for prediabetes, and denies polydipsia, polyuria and visual disturbances. Last A1C in the office was:  Lab Results  Component Value Date   HGBA1C 6.1 (H) 04/04/2019   Patient is on Vitamin D supplement, 2000 IU.   Lab Results  Component Value Date   VD25OH 71 04/04/2019     Saw Dr. Berenice Primas for bilateral knee pain, got injections that helped.    Names of Other Physician/Practitioners you currently use: 1. [facility]- here for primary care 2. Dr. Katy Fitch, eye doctor, last visit 3 months 3. Dr. Osa Craver, dentist, last visit 6 months Patient Care Team: Unk Pinto, MD as PCP - General (Internal Medicine) Lafayette Dragon, MD (Inactive) as Consulting Physician (Gastroenterology) Norma Fredrickson, MD as Consulting Physician (Psychiatry) Curt Bears, MD as Consulting Physician (Oncology) Berenice Primas, MD as Referring Physician  (Orthopedic Surgery) Daryll Brod, MD as Consulting Physician (Orthopedic Surgery)   Medication Review     Current Outpatient Medications (Analgesics):  .  traMADol (ULTRAM) 50 MG tablet, Take 1 tablet (50 mg total) by mouth every 6 (six) hours as needed for moderate pain.  Current Outpatient Medications (Hematological):  Marland Kitchen  Fe Fum-FePoly-FA-Vit C-Vit B3 (FOLIVANE-F) 125-1 MG CAPS, Take 1 capsule Daily  Current Outpatient Medications (Other):  Marland Kitchen  Cholecalciferol (VITAMIN D PO), Take 5,000 Units by mouth daily.  Marland Kitchen  LORazepam (ATIVAN) 1 MG tablet, Take 1 mg by mouth 2 (two) times daily.  Marland Kitchen  venlafaxine XR (EFFEXOR-XR) 150 MG 24 hr capsule, Take 300 mg by mouth at bedtime.   Current Problems (verified) Patient Active Problem List   Diagnosis Date Noted  . S/P lumpectomy, right breast 09/17/2019  . Medication management 02/18/2015  . Hiatal hernia 02/18/2015  . Osteoporosis 09/17/2014  . Abnormal glucose   . Hyperlipidemia, mixed   . Labile hypertension   . Vitamin D deficiency   . Hx of adenomatous colonic polyps 08/11/2010  . Iron deficiency anemia 08/11/2010  .  Chronic anxiety 08/11/2010    Screening Tests Immunization History  Administered Date(s) Administered  . Influenza, High Dose Seasonal PF 12/29/2018  . PFIZER SARS-COV-2 Vaccination 05/07/2019, 05/30/2019  . Pneumococcal Conjugate-13 08/27/2013  . Pneumococcal Polysaccharide-23 10/23/2015  . Pneumococcal-Unspecified 12/06/2001  . Tdap 08/05/2012  . Zoster 02/05/2010   Health Maintenance  Topic Date Due  . COLONOSCOPY  11/22/2018  . INFLUENZA VACCINE  08/26/2019  . TETANUS/TDAP  08/06/2022  . DEXA SCAN  Completed  . COVID-19 Vaccine  Completed  . PNA vac Low Risk Adult  Completed    Preventative care: Last colonoscopy: 2015 EGD 2015 Last mammogram: 09/2018 Last pap smear/pelvic exam: 2012  DEXA: 09/2015 + osteoporosis off the fosamax- could not tolerate due to her stomach.   Allergies Allergies   Allergen Reactions  . Morphine And Related     Unknown     SURGICAL HISTORY She  has a past surgical history that includes Cholecystectomy (2003); Knee arthroscopy; Bunionectomy; Wrist fracture surgery; Cataract extraction (Bilateral); and Breast lumpectomy with radioactive seed localization (Right, 07/05/2019). FAMILY HISTORY Her family history includes Breast cancer in her cousin; Colon cancer in her father; Colon polyps in her maternal aunt; Pancreatic cancer in her paternal aunt; Stroke in her mother; Ulcerative colitis in her brother. SOCIAL HISTORY She  reports that she has never smoked. She has never used smokeless tobacco. She reports that she does not drink alcohol and does not use drugs.  Review of Systems  Constitutional: Positive for malaise/fatigue. Negative for chills, diaphoresis, fever and weight loss.  HENT: Negative.   Eyes: Negative.   Respiratory: Negative.   Cardiovascular: Negative.   Gastrointestinal: Negative.   Genitourinary: Negative.   Musculoskeletal: Negative.   Skin: Negative.   Neurological: Negative.  Negative for weakness.  Psychiatric/Behavioral: Negative.      Objective:     Blood pressure 120/86, pulse 68, temperature (!) 97.3 F (36.3 C), height 5\' 5"  (1.651 m), weight 149 lb (67.6 kg), SpO2 97 %. Body mass index is 24.79 kg/m.  General appearance: alert, no distress, WD/WN,  female HEENT: normocephalic, sclerae anicteric, TMs pearly, nares patent, no discharge or erythema, pharynx normal Oral cavity: MMM, no lesions Neck: supple, no lymphadenopathy, no thyromegaly, no masses Heart: RRR, normal S1, S2, no murmurs Lungs: CTA bilaterally, no wheezes, rhonchi, or rales Abdomen: +bs, soft, non tender, non distended, no masses, no hepatomegaly, no splenomegaly Musculoskeletal: nontender, no swelling, no obvious deformity.  Extremities: no edema, no cyanosis, no clubbing Pulses: 2+ symmetric, upper and lower extremities, normal cap  refill Neurological: alert, oriented x 3, CN2-12 intact, strength normal upper extremities and lower extremities, sensation normal throughout, DTRs 2+ throughout, no cerebellar signs, gait normal Psychiatric: normal affect, behavior normal, pleasant   Vicie Mutters, PA-C   09/19/2019

## 2019-09-19 ENCOUNTER — Other Ambulatory Visit: Payer: Self-pay

## 2019-09-19 ENCOUNTER — Encounter: Payer: Self-pay | Admitting: Physician Assistant

## 2019-09-19 ENCOUNTER — Ambulatory Visit (INDEPENDENT_AMBULATORY_CARE_PROVIDER_SITE_OTHER): Payer: Medicare Other | Admitting: Physician Assistant

## 2019-09-19 VITALS — BP 120/86 | HR 68 | Temp 97.3°F | Ht 65.0 in | Wt 149.0 lb

## 2019-09-19 DIAGNOSIS — D509 Iron deficiency anemia, unspecified: Secondary | ICD-10-CM

## 2019-09-19 DIAGNOSIS — Z79899 Other long term (current) drug therapy: Secondary | ICD-10-CM

## 2019-09-19 DIAGNOSIS — Z0000 Encounter for general adult medical examination without abnormal findings: Secondary | ICD-10-CM

## 2019-09-19 DIAGNOSIS — M810 Age-related osteoporosis without current pathological fracture: Secondary | ICD-10-CM

## 2019-09-19 DIAGNOSIS — F419 Anxiety disorder, unspecified: Secondary | ICD-10-CM

## 2019-09-19 DIAGNOSIS — E782 Mixed hyperlipidemia: Secondary | ICD-10-CM

## 2019-09-19 DIAGNOSIS — R6889 Other general symptoms and signs: Secondary | ICD-10-CM | POA: Diagnosis not present

## 2019-09-19 DIAGNOSIS — Z9889 Other specified postprocedural states: Secondary | ICD-10-CM

## 2019-09-19 DIAGNOSIS — R7309 Other abnormal glucose: Secondary | ICD-10-CM | POA: Diagnosis not present

## 2019-09-19 DIAGNOSIS — Z86010 Personal history of colonic polyps: Secondary | ICD-10-CM

## 2019-09-19 DIAGNOSIS — R0989 Other specified symptoms and signs involving the circulatory and respiratory systems: Secondary | ICD-10-CM

## 2019-09-19 DIAGNOSIS — Z0001 Encounter for general adult medical examination with abnormal findings: Secondary | ICD-10-CM | POA: Diagnosis not present

## 2019-09-19 DIAGNOSIS — K449 Diaphragmatic hernia without obstruction or gangrene: Secondary | ICD-10-CM

## 2019-09-19 DIAGNOSIS — E559 Vitamin D deficiency, unspecified: Secondary | ICD-10-CM

## 2019-09-19 DIAGNOSIS — E538 Deficiency of other specified B group vitamins: Secondary | ICD-10-CM | POA: Diagnosis not present

## 2019-09-19 NOTE — Patient Instructions (Addendum)
Do heating pad for your back Can get voltern gel over the counter to put on your back AND CAN USE ON YOUR HAND UP TO 4 X A DAY- this is a topical antiinflammatory IF you want we can send you to physical therapy if it does not get better.    Gastroesophageal Reflux Disease, Adult Gastroesophageal reflux (GER) happens when acid from the stomach flows up into the tube that connects the mouth and the stomach (esophagus). Normally, food travels down the esophagus and stays in the stomach to be digested. However, when a person has GER, food and stomach acid sometimes move back up into the esophagus. If this becomes a more serious problem, the person may be diagnosed with a disease called gastroesophageal reflux disease (GERD). GERD occurs when the reflux:  Happens often.  Causes frequent or severe symptoms.  Causes problems such as damage to the esophagus. When stomach acid comes in contact with the esophagus, the acid may cause soreness (inflammation) in the esophagus. Over time, GERD may create small holes (ulcers) in the lining of the esophagus. What are the causes? This condition is caused by a problem with the muscle between the esophagus and the stomach (lower esophageal sphincter, or LES). Normally, the LES muscle closes after food passes through the esophagus to the stomach. When the LES is weakened or abnormal, it does not close properly, and that allows food and stomach acid to go back up into the esophagus. The LES can be weakened by certain dietary substances, medicines, and medical conditions, including:  Tobacco use.  Pregnancy.  Having a hiatal hernia.  Alcohol use.  Certain foods and beverages, such as coffee, chocolate, onions, and peppermint. What increases the risk? You are more likely to develop this condition if you:  Have an increased body weight.  Have a connective tissue disorder.  Use NSAID medicines. What are the signs or symptoms? Symptoms of this condition  include:  Heartburn.  Difficult or painful swallowing.  The feeling of having a lump in the throat.  Abitter taste in the mouth.  Bad breath.  Having a large amount of saliva.  Having an upset or bloated stomach.  Belching.  Chest pain. Different conditions can cause chest pain. Make sure you see your health care provider if you experience chest pain.  Shortness of breath or wheezing.  Ongoing (chronic) cough or a night-time cough.  Wearing away of tooth enamel.  Weight loss. How is this diagnosed? Your health care provider will take a medical history and perform a physical exam. To determine if you have mild or severe GERD, your health care provider may also monitor how you respond to treatment. You may also have tests, including:  A test to examine your stomach and esophagus with a small camera (endoscopy).  A test thatmeasures the acidity level in your esophagus.  A test thatmeasures how much pressure is on your esophagus.  A barium swallow or modified barium swallow test to show the shape, size, and functioning of your esophagus. How is this treated? The goal of treatment is to help relieve your symptoms and to prevent complications. Treatment for this condition may vary depending on how severe your symptoms are. Your health care provider may recommend:  Changes to your diet.  Medicine.  Surgery. Follow these instructions at home: Eating and drinking   Follow a diet as recommended by your health care provider. This may involve avoiding foods and drinks such as: ? Coffee and tea (with or without  caffeine). ? Drinks that containalcohol. ? Energy drinks and sports drinks. ? Carbonated drinks or sodas. ? Chocolate and cocoa. ? Peppermint and mint flavorings. ? Garlic and onions. ? Horseradish. ? Spicy and acidic foods, including peppers, chili powder, curry powder, vinegar, hot sauces, and barbecue sauce. ? Citrus fruit juices and citrus fruits, such as  oranges, lemons, and limes. ? Tomato-based foods, such as red sauce, chili, salsa, and pizza with red sauce. ? Fried and fatty foods, such as donuts, french fries, potato chips, and high-fat dressings. ? High-fat meats, such as hot dogs and fatty cuts of red and white meats, such as rib eye steak, sausage, ham, and bacon. ? High-fat dairy items, such as whole milk, butter, and cream cheese.  Eat small, frequent meals instead of large meals.  Avoid drinking large amounts of liquid with your meals.  Avoid eating meals during the 2-3 hours before bedtime.  Avoid lying down right after you eat.  Do not exercise right after you eat. Lifestyle   Do not use any products that contain nicotine or tobacco, such as cigarettes, e-cigarettes, and chewing tobacco. If you need help quitting, ask your health care provider.  Try to reduce your stress by using methods such as yoga or meditation. If you need help reducing stress, ask your health care provider.  If you are overweight, reduce your weight to an amount that is healthy for you. Ask your health care provider for guidance about a safe weight loss goal. General instructions  Pay attention to any changes in your symptoms.  Take over-the-counter and prescription medicines only as told by your health care provider. Do not take aspirin, ibuprofen, or other NSAIDs unless your health care provider told you to do so.  Wear loose-fitting clothing. Do not wear anything tight around your waist that causes pressure on your abdomen.  Raise (elevate) the head of your bed about 6 inches (15 cm).  Avoid bending over if this makes your symptoms worse.  Keep all follow-up visits as told by your health care provider. This is important. Contact a health care provider if:  You have: ? New symptoms. ? Unexplained weight loss. ? Difficulty swallowing or it hurts to swallow. ? Wheezing or a persistent cough. ? A hoarse voice.  Your symptoms do not  improve with treatment. Get help right away if you:  Have pain in your arms, neck, jaw, teeth, or back.  Feel sweaty, dizzy, or light-headed.  Have chest pain or shortness of breath.  Vomit and your vomit looks like blood or coffee grounds.  Faint.  Have stool that is bloody or black.  Cannot swallow, drink, or eat. Summary  Gastroesophageal reflux happens when acid from the stomach flows up into the esophagus. GERD is a disease in which the reflux happens often, causes frequent or severe symptoms, or causes problems such as damage to the esophagus.  Treatment for this condition may vary depending on how severe your symptoms are. Your health care provider may recommend diet and lifestyle changes, medicine, or surgery.  Contact a health care provider if you have new or worsening symptoms.  Take over-the-counter and prescription medicines only as told by your health care provider. Do not take aspirin, ibuprofen, or other NSAIDs unless your health care provider told you to do so.  Keep all follow-up visits as told by your health care provider. This is important. This information is not intended to replace advice given to you by your health care provider. Make sure  you discuss any questions you have with your health care provider. Document Revised: 07/20/2017 Document Reviewed: 07/20/2017 Elsevier Patient Education  [place].  Back Exercises The following exercises strengthen the muscles that help to support the trunk and back. They also help to keep the lower back flexible. Doing these exercises can help to prevent back pain or lessen existing pain.  If you have back pain or discomfort, try doing these exercises 2-3 times each day or as told by your health care provider.  As your pain improves, do them once each day, but increase the number of times that you repeat the steps for each exercise (do more repetitions).  To prevent the recurrence of back pain, continue to do  these exercises once each day or as told by your health care provider. Do exercises exactly as told by your health care provider and adjust them as directed. It is normal to feel mild stretching, pulling, tightness, or discomfort as you do these exercises, but you should stop right away if you feel sudden pain or your pain gets worse. Exercises Single knee to chest Repeat these steps 3-5 times for each leg: 1. Lie on your back on a firm bed or the floor with your legs extended. 2. Bring one knee to your chest. Your other leg should stay extended and in contact with the floor. 3. Hold your knee in place by grabbing your knee or thigh with both hands and hold. 4. Pull on your knee until you feel a gentle stretch in your lower back or buttocks. 5. Hold the stretch for 10-30 seconds. 6. Slowly release and straighten your leg. Pelvic tilt Repeat these steps 5-10 times: 1. Lie on your back on a firm bed or the floor with your legs extended. 2. Bend your knees so they are pointing toward the ceiling and your feet are flat on the floor. 3. Tighten your lower abdominal muscles to press your lower back against the floor. This motion will tilt your pelvis so your tailbone points up toward the ceiling instead of pointing to your feet or the floor. 4. With gentle tension and even breathing, hold this position for 5-10 seconds. Cat-cow Repeat these steps until your lower back becomes more flexible: 1. Get into a hands-and-knees position on a firm surface. Keep your hands under your shoulders, and keep your knees under your hips. You may place padding under your knees for comfort. 2. Let your head hang down toward your chest. Contract your abdominal muscles and point your tailbone toward the floor so your lower back becomes rounded like the back of a cat. 3. Hold this position for 5 seconds. 4. Slowly lift your head, let your abdominal muscles relax and point your tailbone up toward the ceiling so your back  forms a sagging arch like the back of a cow. 5. Hold this position for 5 seconds.  Press-ups Repeat these steps 5-10 times: 1. Lie on your abdomen (face-down) on the floor. 2. Place your palms near your head, about shoulder-width apart. 3. Keeping your back as relaxed as possible and keeping your hips on the floor, slowly straighten your arms to raise the top half of your body and lift your shoulders. Do not use your back muscles to raise your upper torso. You may adjust the placement of your hands to make yourself more comfortable. 4. Hold this position for 5 seconds while you keep your back relaxed. 5. Slowly return to lying flat on the floor.  Constance Haw  Repeat these steps 10 times: 1. Lie on your back on a firm surface. 2. Bend your knees so they are pointing toward the ceiling and your feet are flat on the floor. Your arms should be flat at your sides, next to your body. 3. Tighten your buttocks muscles and lift your buttocks off the floor until your waist is at almost the same height as your knees. You should feel the muscles working in your buttocks and the back of your thighs. If you do not feel these muscles, slide your feet 1-2 inches farther away from your buttocks. 4. Hold this position for 3-5 seconds. 5. Slowly lower your hips to the starting position, and allow your buttocks muscles to relax completely. If this exercise is too easy, try doing it with your arms crossed over your chest. Abdominal crunches Repeat these steps 5-10 times: 1. Lie on your back on a firm bed or the floor with your legs extended. 2. Bend your knees so they are pointing toward the ceiling and your feet are flat on the floor. 3. Cross your arms over your chest. 4. Tip your chin slightly toward your chest without bending your neck. 5. Tighten your abdominal muscles and slowly raise your trunk (torso) high enough to lift your shoulder blades a tiny bit off the floor. Avoid raising your torso higher than that  because it can put too much stress on your low back and does not help to strengthen your abdominal muscles. 6. Slowly return to your starting position. Back lifts Repeat these steps 5-10 times: 1. Lie on your abdomen (face-down) with your arms at your sides, and rest your forehead on the floor. 2. Tighten the muscles in your legs and your buttocks. 3. Slowly lift your chest off the floor while you keep your hips pressed to the floor. Keep the back of your head in line with the curve in your back. Your eyes should be looking at the floor. 4. Hold this position for 3-5 seconds. 5. Slowly return to your starting position. Contact a health care provider if:  Your back pain or discomfort gets much worse when you do an exercise.  Your worsening back pain or discomfort does not lessen within 2 hours after you exercise. If you have any of these problems, stop doing these exercises right away. Do not do them again unless your health care provider says that you can. Get help right away if:  You develop sudden, severe back pain. If this happens, stop doing the exercises right away. Do not do them again unless your health care provider says that you can. This information is not intended to replace advice given to you by your health care provider. Make sure you discuss any questions you have with your health care provider. Document Revised: 05/18/2018 Document Reviewed: 10/13/2017 Elsevier Patient Education  [place].

## 2019-09-20 LAB — COMPLETE METABOLIC PANEL WITH GFR
AG Ratio: 1.8 (calc) (ref 1.0–2.5)
ALT: 10 U/L (ref 6–29)
AST: 14 U/L (ref 10–35)
Albumin: 4.4 g/dL (ref 3.6–5.1)
Alkaline phosphatase (APISO): 87 U/L (ref 37–153)
BUN: 12 mg/dL (ref 7–25)
CO2: 28 mmol/L (ref 20–32)
Calcium: 9.4 mg/dL (ref 8.6–10.4)
Chloride: 105 mmol/L (ref 98–110)
Creat: 0.61 mg/dL (ref 0.60–0.88)
GFR, Est African American: 99 mL/min/{1.73_m2} (ref >=60)
GFR, Est Non African American: 85 mL/min/{1.73_m2} (ref >=60)
Globulin: 2.4 g/dL (calc) (ref 1.9–3.7)
Glucose, Bld: 130 mg/dL — ABNORMAL HIGH (ref 65–99)
Potassium: 3.8 mmol/L (ref 3.5–5.3)
Sodium: 141 mmol/L (ref 135–146)
Total Bilirubin: 0.3 mg/dL (ref 0.2–1.2)
Total Protein: 6.8 g/dL (ref 6.1–8.1)

## 2019-09-20 LAB — IRON, TOTAL/TOTAL IRON BINDING CAP
%SAT: 11 % (calc) — ABNORMAL LOW (ref 16–45)
Iron: 49 ug/dL (ref 45–160)
TIBC: 461 mcg/dL (calc) — ABNORMAL HIGH (ref 250–450)

## 2019-09-20 LAB — HEMOGLOBIN A1C
Hgb A1c MFr Bld: 6.2 % of total Hgb — ABNORMAL HIGH (ref <5.7)
Mean Plasma Glucose: 131 (calc)
eAG (mmol/L): 7.3 (calc)

## 2019-09-20 LAB — CBC WITH DIFFERENTIAL/PLATELET
Absolute Monocytes: 456 cells/uL (ref 200–950)
Basophils Absolute: 29 cells/uL (ref 0–200)
Basophils Relative: 0.6 %
Eosinophils Absolute: 10 cells/uL — ABNORMAL LOW (ref 15–500)
Eosinophils Relative: 0.2 %
HCT: 38.6 % (ref 35.0–45.0)
Hemoglobin: 12.2 g/dL (ref 11.7–15.5)
Lymphs Abs: 1099 cells/uL (ref 850–3900)
MCH: 24.2 pg — ABNORMAL LOW (ref 27.0–33.0)
MCHC: 31.6 g/dL — ABNORMAL LOW (ref 32.0–36.0)
MCV: 76.4 fL — ABNORMAL LOW (ref 80.0–100.0)
MPV: 10.9 fL (ref 7.5–12.5)
Monocytes Relative: 9.5 %
Neutro Abs: 3206 cells/uL (ref 1500–7800)
Neutrophils Relative %: 66.8 %
Platelets: 334 10*3/uL (ref 140–400)
RBC: 5.05 10*6/uL (ref 3.80–5.10)
RDW: 14.6 % (ref 11.0–15.0)
Total Lymphocyte: 22.9 %
WBC: 4.8 10*3/uL (ref 3.8–10.8)

## 2019-09-20 LAB — LIPID PANEL
Cholesterol: 228 mg/dL — ABNORMAL HIGH (ref <200)
HDL: 74 mg/dL (ref >=50)
LDL Cholesterol (Calc): 128 mg/dL (calc) — ABNORMAL HIGH
Non-HDL Cholesterol (Calc): 154 mg/dL (calc) — ABNORMAL HIGH (ref <130)
Total CHOL/HDL Ratio: 3.1 (calc) (ref <5.0)
Triglycerides: 151 mg/dL — ABNORMAL HIGH (ref <150)

## 2019-09-20 LAB — TSH: TSH: 2.36 mIU/L (ref 0.40–4.50)

## 2019-09-20 LAB — MAGNESIUM: Magnesium: 1.9 mg/dL (ref 1.5–2.5)

## 2019-09-20 LAB — VITAMIN B12: Vitamin B-12: 367 pg/mL (ref 200–1100)

## 2019-09-20 LAB — FERRITIN: Ferritin: 8 ng/mL — ABNORMAL LOW (ref 16–288)

## 2019-09-20 LAB — VITAMIN D 25 HYDROXY (VIT D DEFICIENCY, FRACTURES): Vit D, 25-Hydroxy: 70 ng/mL (ref 30–100)

## 2019-09-21 ENCOUNTER — Other Ambulatory Visit: Payer: Self-pay | Admitting: Internal Medicine

## 2019-09-21 DIAGNOSIS — Z1231 Encounter for screening mammogram for malignant neoplasm of breast: Secondary | ICD-10-CM

## 2019-10-19 ENCOUNTER — Ambulatory Visit
Admission: RE | Admit: 2019-10-19 | Discharge: 2019-10-19 | Disposition: A | Discharge status: Home / Self Care | Payer: Medicare Other | Source: Ambulatory Visit | Attending: Internal Medicine | Admitting: Internal Medicine

## 2019-10-19 ENCOUNTER — Other Ambulatory Visit: Payer: Self-pay

## 2019-10-19 DIAGNOSIS — Z1231 Encounter for screening mammogram for malignant neoplasm of breast: Secondary | ICD-10-CM | POA: Diagnosis not present

## 2019-11-12 DIAGNOSIS — Z20822 Contact with and (suspected) exposure to covid-19: Secondary | ICD-10-CM | POA: Diagnosis not present

## 2019-12-03 ENCOUNTER — Telehealth: Payer: Self-pay | Admitting: Internal Medicine

## 2019-12-03 NOTE — Telephone Encounter (Signed)
Patient is calling to because she has lost her COVID immunization record and would like to know can another one be sent out to her. Please advise CB- [phone]

## 2019-12-26 IMAGING — MG MM DIGITAL DIAGNOSTIC BILAT W/ TOMO W/ CAD
6 of 10 series · 6 of 30 positions shown · non-contrast
Comparison: Previous exam(s).

CLINICAL DATA: Known fibrocystic changes.  Lump on the left.

EXAM:
DIGITAL DIAGNOSTIC BILATERAL MAMMOGRAM WITH CAD AND TOMO
ULTRASOUND BILATERAL BREAST

[L TAN synth-2D]
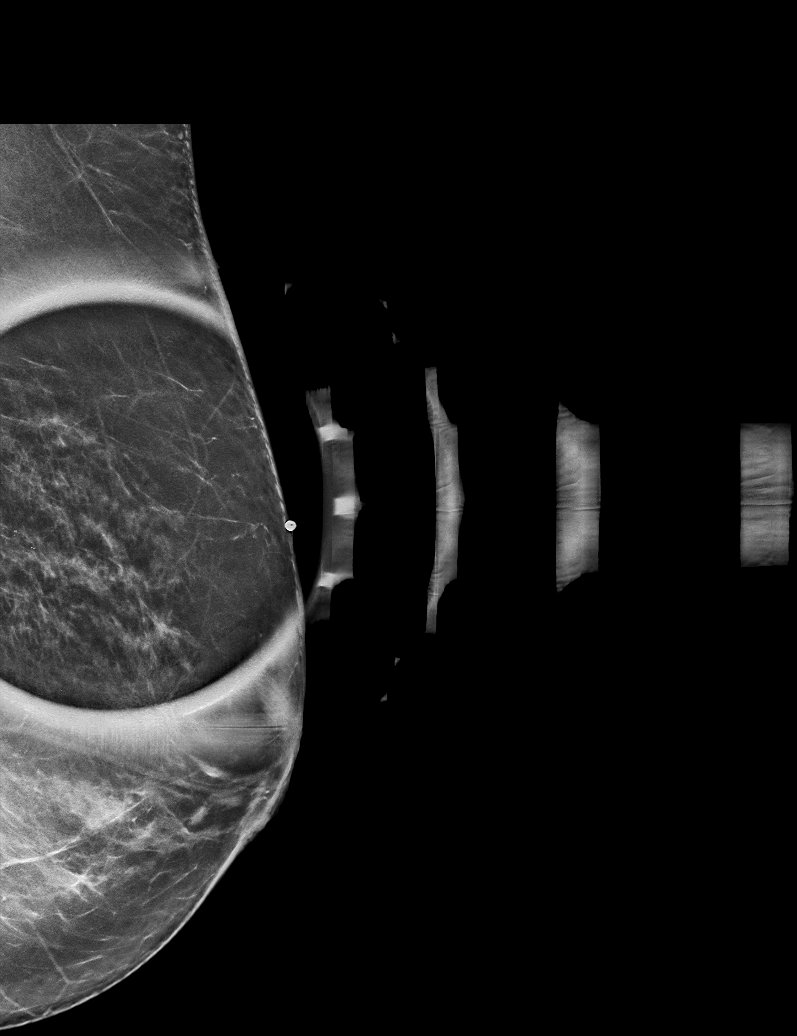

[R MLO synth-2D]
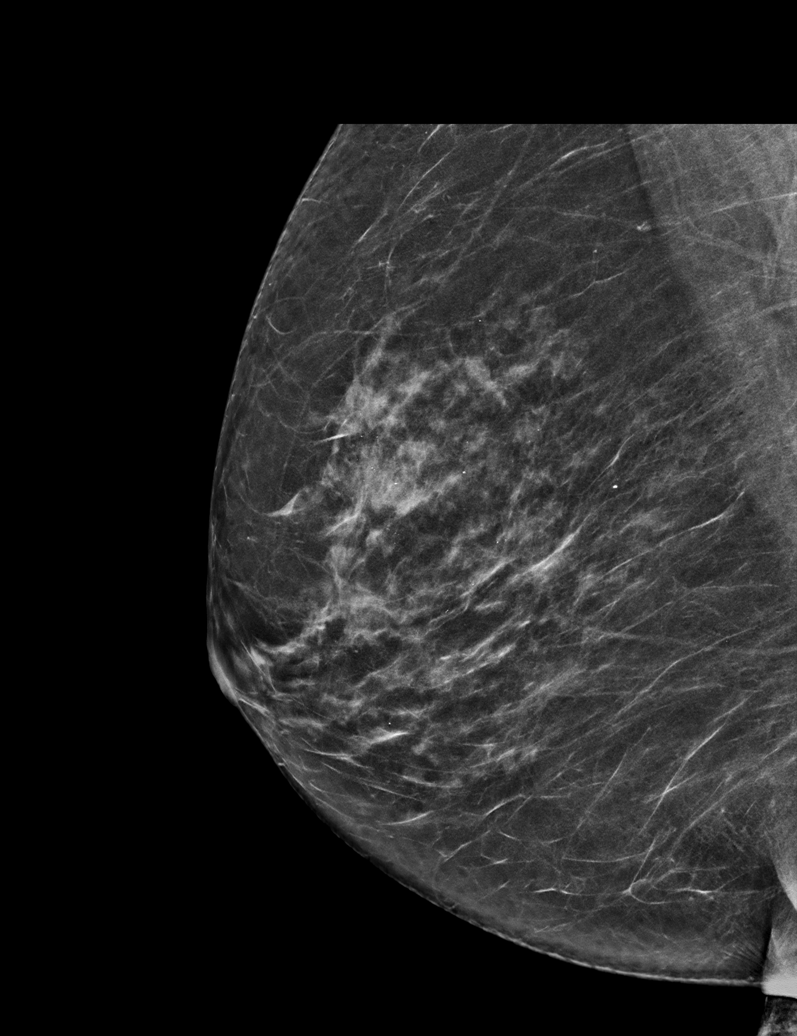

[R CC synth-2D]
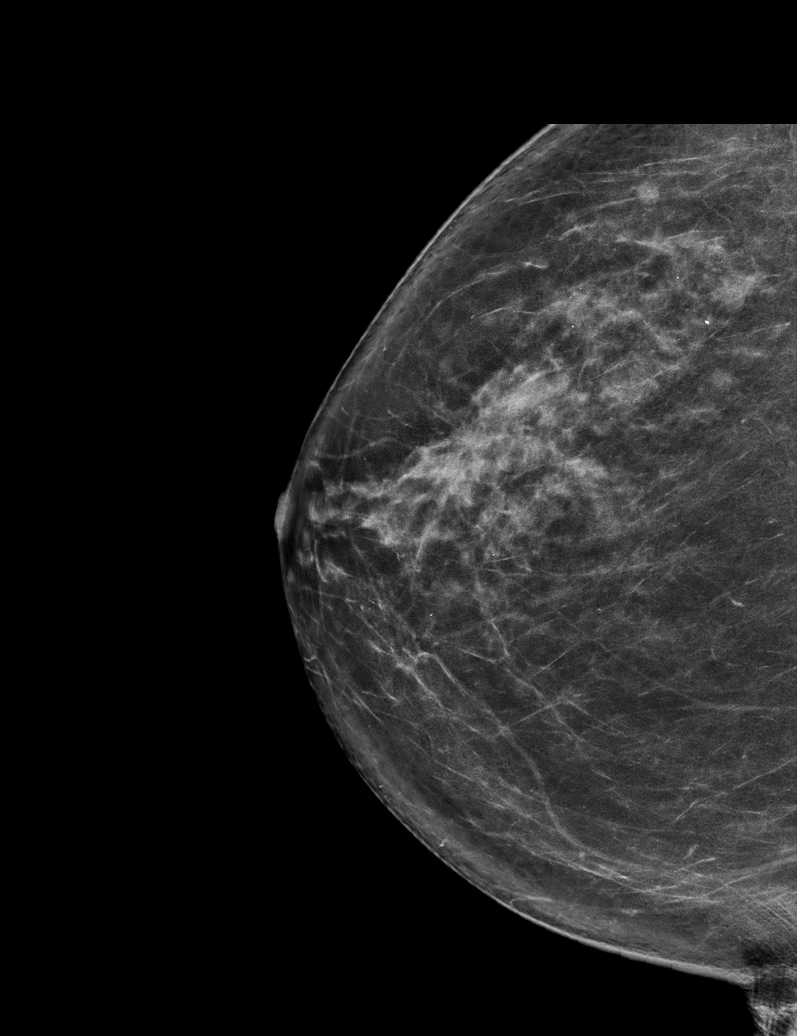

[L MLO synth-2D]
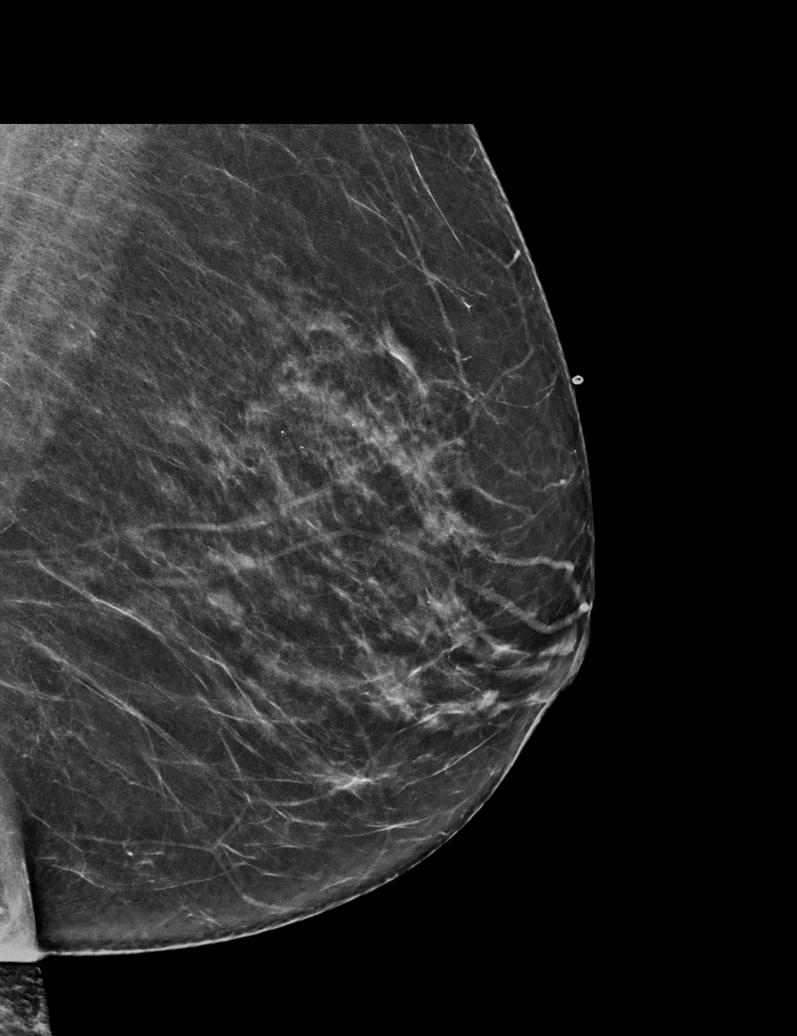

[L CC synth-2D]
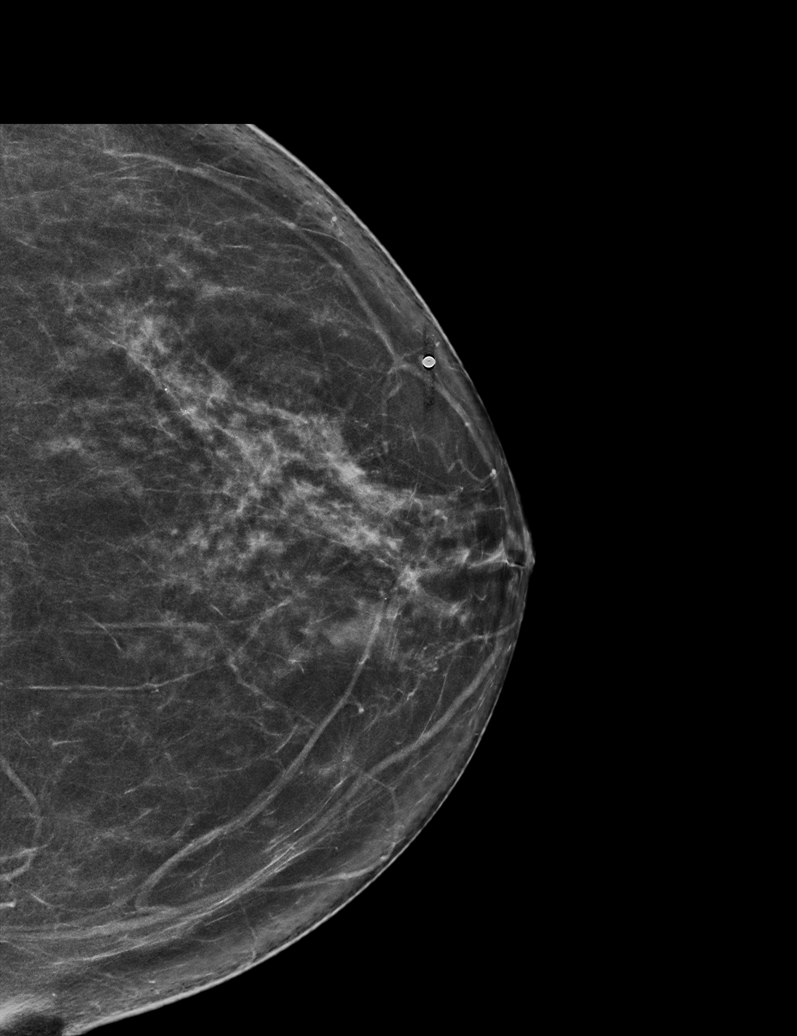

[L TAN tomo · tomo slice 31/60.0]
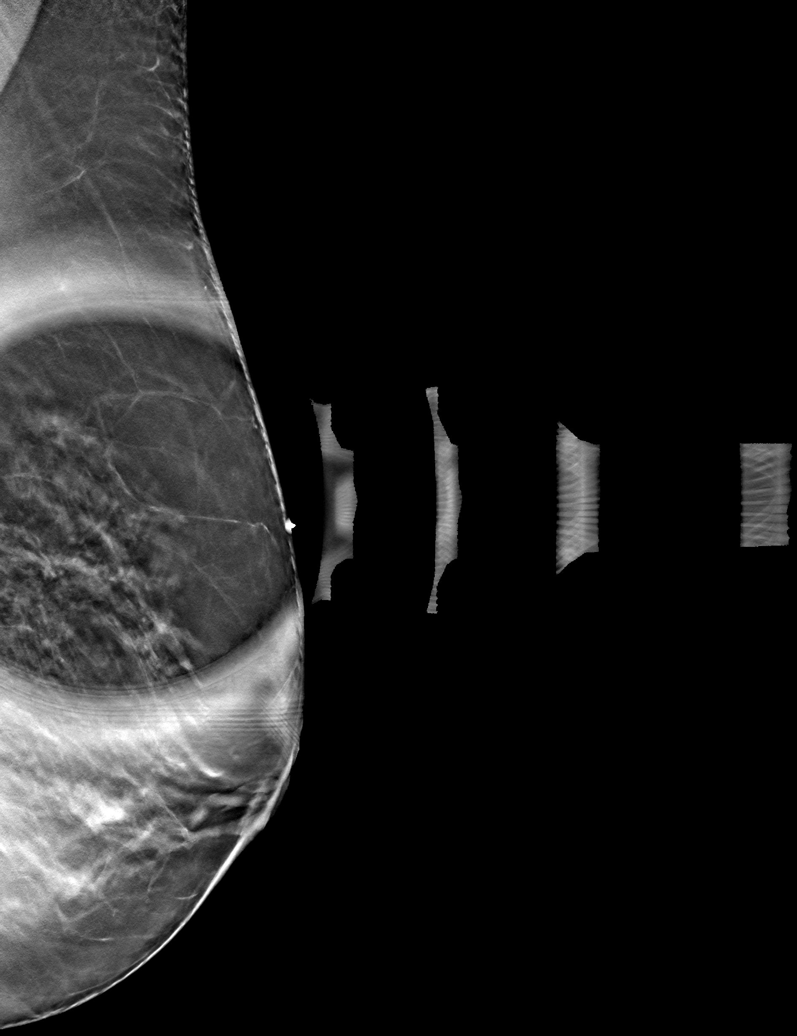

[6 of 30 positions shown; findings below may reference images not displayed]

ACR Breast Density Category b: There are scattered areas of
fibroglandular density.
FINDINGS: Obscured masses are seen in both breasts consistent with the known
fibrocystic changes. There is distortion in the upper outer right
breast which appears to represent a change and is best seen on the
standard views.

Mammographic images were processed with CAD.

On physical exam, no suspicious lumps are identified.

Targeted ultrasound is performed, showing bilateral fibrocystic
changes. No other suspicious findings.
IMPRESSION: The patient is feeling a cyst on the left. There are bilateral
fibrocystic changes. There is distortion in the upper outer right
breast which appears to represent a change.

RECOMMENDATION:
Recommend stereotactic biopsy of the right breast distortion.

I have discussed the findings and recommendations with the patient.
If applicable, a reminder letter will be sent to the patient
regarding the next appointment.

BI-RADS CATEGORY  4: Suspicious.

## 2019-12-27 ENCOUNTER — Encounter: Payer: Self-pay | Admitting: Adult Health Nurse Practitioner

## 2019-12-27 ENCOUNTER — Ambulatory Visit (INDEPENDENT_AMBULATORY_CARE_PROVIDER_SITE_OTHER): Payer: Medicare Other | Admitting: Adult Health Nurse Practitioner

## 2019-12-27 ENCOUNTER — Other Ambulatory Visit: Payer: Self-pay

## 2019-12-27 VITALS — BP 124/80 | HR 83 | Temp 97.3°F | Ht 67.0 in | Wt 149.0 lb

## 2019-12-27 DIAGNOSIS — H6123 Impacted cerumen, bilateral: Secondary | ICD-10-CM | POA: Diagnosis not present

## 2019-12-27 DIAGNOSIS — Z9889 Other specified postprocedural states: Secondary | ICD-10-CM

## 2019-12-27 DIAGNOSIS — Z0001 Encounter for general adult medical examination with abnormal findings: Secondary | ICD-10-CM

## 2019-12-27 DIAGNOSIS — R0989 Other specified symptoms and signs involving the circulatory and respiratory systems: Secondary | ICD-10-CM

## 2019-12-27 DIAGNOSIS — H9203 Otalgia, bilateral: Secondary | ICD-10-CM

## 2019-12-27 DIAGNOSIS — E559 Vitamin D deficiency, unspecified: Secondary | ICD-10-CM

## 2019-12-27 DIAGNOSIS — M25562 Pain in left knee: Secondary | ICD-10-CM

## 2019-12-27 DIAGNOSIS — R7309 Other abnormal glucose: Secondary | ICD-10-CM

## 2019-12-27 DIAGNOSIS — Z86010 Personal history of colonic polyps: Secondary | ICD-10-CM

## 2019-12-27 DIAGNOSIS — G8929 Other chronic pain: Secondary | ICD-10-CM

## 2019-12-27 DIAGNOSIS — Z860101 Personal history of adenomatous and serrated colon polyps: Secondary | ICD-10-CM

## 2019-12-27 DIAGNOSIS — R300 Dysuria: Secondary | ICD-10-CM | POA: Diagnosis not present

## 2019-12-27 DIAGNOSIS — E782 Mixed hyperlipidemia: Secondary | ICD-10-CM

## 2019-12-27 DIAGNOSIS — M810 Age-related osteoporosis without current pathological fracture: Secondary | ICD-10-CM

## 2019-12-27 DIAGNOSIS — D509 Iron deficiency anemia, unspecified: Secondary | ICD-10-CM

## 2019-12-27 DIAGNOSIS — D508 Other iron deficiency anemias: Secondary | ICD-10-CM

## 2019-12-27 NOTE — Progress Notes (Signed)
COMPLETE PHYSICAL  Assessment:   Encounter for routine medical examination with abnormal findings Yearly  Labile hypertension - continue medications, DASH diet, exercise and monitor at home. Call if greater than 130/80.  -     CBC with Differential/Platelet -     COMPLETE METABOLIC PANEL WITH GFR -     TSH  Other iron deficiency anemia -     Iron,Total/Total Iron Binding Cap -     Ferritin  Chronic pain of both knees Discussed naproxen, take with food -   Monitor    Hyperlipidemia, mixed check lipids decrease fatty foods increase activity.  -     Lipid panel  Medication management Continued  Vitamin D deficiency Continue supplementation to maintain goal of 70-100 Taking Vitamin Ddaily Defer vitamin D level at goal   Chronic anxiety -continue medications, stress management techniques discussed, increase water, good sleep hygiene discussed, increase exercise, and increase veggies.   Abnormal glucose Discussed disease progression and risks Discussed diet/exercise, weight management and risk modification -     Hemoglobin A1c  Hx of adenomatous colonic polyps UTD  Osteoporosis, unspecified osteoporosis type, unspecified pathological fracture presence continue Vit D and Ca, weight bearing exercises  Hiatal hernia Monitor  BMI 24.0-24.9, adult Discussed dietary and exercise modifications  S/P lumpectomy, right breast Benign, will  Proceed with follow up MGM but then discussed need for further testing    Future Appointments  Date Time Provider [place]  05/01/2020 11:30 AM Unk Pinto, MD [facility] None  12/29/2020  3:00 PM Garnet Sierras, NP [facility] None      HPI:   Stephanie Roach is a 84 y.o. female who presents for complete physical and follow up for HTN. HLD, osteoporosis, vitamin D deeficency and weight.  .    Her blood pressure has been controlled at home, today their BP is BP: 124/80 She does not workout.  She denies chest  pain,dizziness.   Patient had right breast lumpectomy with Dr. Ninfa Linden 07/05/19 due to abnormal MGM and pathology was benign  Husband, charles diagnosed with parkinson's and he is declining very quickly, she was moving him and pulled something on her right back 3 months ago. She does not have much help with him, her nephews will help mow their grass and get groceries.  She is on effexor 150mg  a day which helps.   She has dental infection, going to get on amoxicillin and will need root canal.   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 Olevia Perches) which lead to iron def anemia, she is not on PPI at this time, she is not on any NSAIDS. She is on integra and following with Dr. Julien Nordmann. Lab Results  Component Value Date   IRON 49 09/19/2019   TIBC 461 (H) 09/19/2019   FERRITIN 8 (L) 09/19/2019     She is not on cholesterol medication. Her cholesterol is not at goal. The cholesterol last visit was:   Lab Results  Component Value Date   CHOL 228 (H) 09/19/2019   HDL 74 09/19/2019   LDLCALC 128 (H) 09/19/2019   TRIG 151 (H) 09/19/2019   CHOLHDL 3.1 09/19/2019    She has been working on diet and exercise for prediabetes, and denies polydipsia, polyuria and visual disturbances. Last A1C in the office was:  Lab Results  Component Value Date   HGBA1C 6.4 (H) 12/27/2019   Patient is on Vitamin D supplement, 2000 IU.   Lab Results  Component  Value Date   VD25OH 70 09/19/2019     Saw Dr. Berenice Primas for bilateral knee pain, got injections that helped.    Names of Other Physician/Practitioners you currently use: 1. [facility]- here for primary care 2. Dr. Katy Fitch, eye doctor, last visit 3 months 3. Dr. Osa Craver, dentist, last visit 6 months Patient Care Team: Unk Pinto, MD as PCP - General (Internal Medicine) Lafayette Dragon, MD (Inactive) as Consulting Physician (Gastroenterology) Norma Fredrickson, MD as Consulting  Physician (Psychiatry) Curt Bears, MD as Consulting Physician (Oncology) Berenice Primas, MD as Referring Physician (Orthopedic Surgery) Daryll Brod, MD as Consulting Physician (Orthopedic Surgery)   Medication Review      Current Outpatient Medications (Hematological):  Marland Kitchen  Fe Fum-FePoly-FA-Vit C-Vit B3 (FOLIVANE-F) 125-1 MG CAPS, Take 1 capsule Daily  Current Outpatient Medications (Other):  Marland Kitchen  Cholecalciferol (VITAMIN D PO), Take 5,000 Units by mouth daily.  Marland Kitchen  LORazepam (ATIVAN) 1 MG tablet, Take 1 mg by mouth 2 (two) times daily.  Marland Kitchen  venlafaxine XR (EFFEXOR-XR) 150 MG 24 hr capsule, Take 300 mg by mouth at bedtime.   Current Problems (verified) Patient Active Problem List   Diagnosis Date Noted  . S/P lumpectomy, right breast 09/17/2019  . Medication management 02/18/2015  . Hiatal hernia 02/18/2015  . Osteoporosis 09/17/2014  . Abnormal glucose   . Hyperlipidemia, mixed   . Labile hypertension   . Vitamin D deficiency   . Hx of adenomatous colonic polyps 08/11/2010  . Iron deficiency anemia 08/11/2010  . Chronic anxiety 08/11/2010    Screening Tests Immunization History  Administered Date(s) Administered  . Influenza, High Dose Seasonal PF 12/29/2018  . PFIZER SARS-COV-2 Vaccination 05/07/2019, 05/30/2019  . Pneumococcal Conjugate-13 08/27/2013  . Pneumococcal Polysaccharide-23 10/23/2015  . Pneumococcal-Unspecified 12/06/2001  . Tdap 08/05/2012  . Zoster 02/05/2010   Health Maintenance  Topic Date Due  . COLONOSCOPY  11/22/2018  . INFLUENZA VACCINE  08/26/2019  . COVID-19 Vaccine (3 - Booster for Pfizer series) 11/30/2019  . TETANUS/TDAP  08/06/2022  . DEXA SCAN  Completed  . PNA vac Low Risk Adult  Completed    Preventative care: Last colonoscopy: 2015  EGD 2015 Last mammogram: 09/2018 Last pap smear/pelvic exam: 2012  DEXA: 09/2015 + osteoporosis off the fosamax- could not tolerate due to her stomach.   Allergies Allergies  Allergen  Reactions  . Morphine And Related     Unknown     SURGICAL HISTORY She  has a past surgical history that includes Cholecystectomy (2003); Knee arthroscopy; Bunionectomy; Wrist fracture surgery; Cataract extraction (Bilateral); Breast lumpectomy with radioactive seed localization (Right, 07/05/2019); Breast biopsy (Right, 10/19/2018); and Breast excisional biopsy (Right, 07/05/2019). FAMILY HISTORY Her family history includes Breast cancer in her cousin; Colon cancer in her father; Colon polyps in her maternal aunt; Pancreatic cancer in her paternal aunt; Stroke in her mother; Ulcerative colitis in her brother. SOCIAL HISTORY She  reports that she has never smoked. She has never used smokeless tobacco. She reports that she does not drink alcohol and does not use drugs.  Review of Systems  Constitutional: Positive for malaise/fatigue. Negative for chills, diaphoresis, fever and weight loss.  HENT: Negative.   Eyes: Negative.   Respiratory: Negative.   Cardiovascular: Negative.   Gastrointestinal: Negative.   Genitourinary: Negative.   Musculoskeletal: Negative.   Skin: Negative.   Neurological: Negative.  Negative for weakness.  Psychiatric/Behavioral: Negative.      Objective:     Blood pressure 124/80,  pulse 83, temperature (!) 97.3 F (36.3 C), height 5\' 7"  (1.702 m), weight 149 lb (67.6 kg), SpO2 97 %. Body mass index is 23.34 kg/m.  General appearance: alert, no distress, WD/WN,  female HEENT: normocephalic, sclerae anicteric, TMs obstructed by cerumen bilaterally., nares patent, no discharge or erythema, pharynx normal Oral cavity: MMM, no lesions Neck: supple, no lymphadenopathy, no thyromegaly, no masses Heart: RRR, normal S1, S2, no murmurs Lungs: CTA bilaterally, no wheezes, rhonchi, or rales Abdomen: +bs, soft, non tender, non distended, no masses, no hepatomegaly, no splenomegaly Musculoskeletal: nontender, no swelling, no obvious deformity.  Extremities: no edema,  no cyanosis, no clubbing Pulses: 2+ symmetric, upper and lower extremities, normal cap refill Neurological: alert, oriented x 3, CN2-12 intact, strength normal upper extremities and lower extremities, sensation normal throughout, DTRs 2+ throughout, no cerebellar signs, gait normal Psychiatric: normal affect, behavior normal, pleasant    Garnet Sierras, Laqueta Jean, DNP [facility] 12/27/2019  4:55 PM

## 2019-12-27 NOTE — Patient Instructions (Addendum)
Look into getting you Influenza vaccination.   When you are sneezing and have watery eyes try (Loratadine) Claritin daily.   You can also try saline nasal spray to help keep your nose moist if it is dry.   When you feel like you have sinus pressure in your head try Sudafed, nasal decongestant.  This is usually behind the counter at the pharmacy.    Ear care: Both of your ears are clear of wax at this time Use 2-3 drops of oil for next 3-4 nights.  This will help to sooth your ear canals.  Use cotton ball to keep in ears while sleeping.  For regular maintenance: Use warm or room temp peroxide in ear once every 1-2 weeks, then apply oil that night x1.   Next morning let warm water from shower run into your ears.   This will help flush out any wax Do not use cotton swabs in your ears Should you have pain, decrease in your ability to hear or dizziness please contact the office for an appointment.    For the pain in your hands, you can try naproxen (Aleve)  Take one tablet as needed.  You can take this up to twice a day.  Take this with food to avoid stomach upset.  If you can help it try not to take every day.   GENERAL HEALTH GOALS  Know what a healthy weight is for you (roughly BMI <25) and aim to maintain this  Aim for 7+ servings of fruits and vegetables daily  70-80+ fluid ounces of water or unsweet tea for healthy kidneys  Limit to max 1 drink of alcohol per day; avoid smoking/tobacco  Limit animal fats in diet for cholesterol and heart health - choose grass fed whenever available  Avoid highly processed foods, and foods high in saturated/trans fats  Aim for low stress - take time to unwind and care for your mental health  Aim for 150 min of moderate intensity exercise weekly for heart health, and weights twice weekly for bone health  Aim for 7-9 hours of sleep daily

## 2019-12-28 LAB — COMPLETE METABOLIC PANEL WITH GFR
AG Ratio: 1.8 (calc) (ref 1.0–2.5)
ALT: 11 U/L (ref 6–29)
AST: 15 U/L (ref 10–35)
Albumin: 4.4 g/dL (ref 3.6–5.1)
Alkaline phosphatase (APISO): 88 U/L (ref 37–153)
BUN: 12 mg/dL (ref 7–25)
CO2: 23 mmol/L (ref 20–32)
Calcium: 9.1 mg/dL (ref 8.6–10.4)
Chloride: 104 mmol/L (ref 98–110)
Creat: 0.61 mg/dL (ref 0.60–0.88)
GFR, Est African American: 98 mL/min/{1.73_m2} (ref >=60)
GFR, Est Non African American: 84 mL/min/{1.73_m2} (ref >=60)
Globulin: 2.5 g/dL (calc) (ref 1.9–3.7)
Glucose, Bld: 102 mg/dL — ABNORMAL HIGH (ref 65–99)
Potassium: 3.6 mmol/L (ref 3.5–5.3)
Sodium: 142 mmol/L (ref 135–146)
Total Bilirubin: 0.4 mg/dL (ref 0.2–1.2)
Total Protein: 6.9 g/dL (ref 6.1–8.1)

## 2019-12-28 LAB — HEMOGLOBIN A1C
Hgb A1c MFr Bld: 6.4 % of total Hgb — ABNORMAL HIGH (ref <5.7)
Mean Plasma Glucose: 137 (calc)
eAG (mmol/L): 7.6 (calc)

## 2019-12-28 LAB — CBC WITH DIFFERENTIAL/PLATELET
Absolute Monocytes: 521 cells/uL (ref 200–950)
Basophils Absolute: 19 cells/uL (ref 0–200)
Basophils Relative: 0.3 %
Eosinophils Absolute: 0 cells/uL — ABNORMAL LOW (ref 15–500)
Eosinophils Relative: 0 %
HCT: 40.5 % (ref 35.0–45.0)
Hemoglobin: 12.9 g/dL (ref 11.7–15.5)
Lymphs Abs: 1463 cells/uL (ref 850–3900)
MCH: 24.9 pg — ABNORMAL LOW (ref 27.0–33.0)
MCHC: 31.9 g/dL — ABNORMAL LOW (ref 32.0–36.0)
MCV: 78.2 fL — ABNORMAL LOW (ref 80.0–100.0)
MPV: 10.7 fL (ref 7.5–12.5)
Monocytes Relative: 8.4 %
Neutro Abs: 4197 cells/uL (ref 1500–7800)
Neutrophils Relative %: 67.7 %
Platelets: 348 10*3/uL (ref 140–400)
RBC: 5.18 10*6/uL — ABNORMAL HIGH (ref 3.80–5.10)
RDW: 14.3 % (ref 11.0–15.0)
Total Lymphocyte: 23.6 %
WBC: 6.2 10*3/uL (ref 3.8–10.8)

## 2019-12-28 LAB — TSH: TSH: 1.95 mIU/L (ref 0.40–4.50)

## 2020-03-20 ENCOUNTER — Other Ambulatory Visit: Payer: Self-pay

## 2020-03-20 ENCOUNTER — Ambulatory Visit (INDEPENDENT_AMBULATORY_CARE_PROVIDER_SITE_OTHER): Payer: Medicare Other | Admitting: Internal Medicine

## 2020-03-20 VITALS — BP 128/82 | HR 83 | Temp 97.9°F | Resp 16 | Ht 65.0 in | Wt 151.8 lb

## 2020-03-20 DIAGNOSIS — F5102 Adjustment insomnia: Secondary | ICD-10-CM

## 2020-03-20 DIAGNOSIS — R0989 Other specified symptoms and signs involving the circulatory and respiratory systems: Secondary | ICD-10-CM | POA: Diagnosis not present

## 2020-03-20 DIAGNOSIS — Z79899 Other long term (current) drug therapy: Secondary | ICD-10-CM | POA: Diagnosis not present

## 2020-03-20 DIAGNOSIS — R55 Syncope and collapse: Secondary | ICD-10-CM | POA: Diagnosis not present

## 2020-03-20 LAB — COMPLETE METABOLIC PANEL WITH GFR
AG Ratio: 1.8 (calc) (ref 1.0–2.5)
ALT: 13 U/L (ref 6–29)
AST: 14 U/L (ref 10–35)
Albumin: 4.3 g/dL (ref 3.6–5.1)
Alkaline phosphatase (APISO): 88 U/L (ref 37–153)
BUN: 11 mg/dL (ref 7–25)
CO2: 30 mmol/L (ref 20–32)
Calcium: 9.2 mg/dL (ref 8.6–10.4)
Chloride: 104 mmol/L (ref 98–110)
Creat: 0.64 mg/dL (ref 0.60–0.88)
GFR, Est African American: 96 mL/min/{1.73_m2} (ref >=60)
GFR, Est Non African American: 83 mL/min/{1.73_m2} (ref >=60)
Globulin: 2.4 g/dL (calc) (ref 1.9–3.7)
Glucose, Bld: 113 mg/dL — ABNORMAL HIGH (ref 65–99)
Potassium: 3.9 mmol/L (ref 3.5–5.3)
Sodium: 142 mmol/L (ref 135–146)
Total Bilirubin: 0.3 mg/dL (ref 0.2–1.2)
Total Protein: 6.7 g/dL (ref 6.1–8.1)

## 2020-03-20 LAB — CBC WITH DIFFERENTIAL/PLATELET
Absolute Monocytes: 653 cells/uL (ref 200–950)
Basophils Absolute: 28 cells/uL (ref 0–200)
Basophils Relative: 0.4 %
Eosinophils Absolute: 0 cells/uL — ABNORMAL LOW (ref 15–500)
Eosinophils Relative: 0 %
HCT: 38.3 % (ref 35.0–45.0)
Hemoglobin: 12.8 g/dL (ref 11.7–15.5)
Lymphs Abs: 1527 cells/uL (ref 850–3900)
MCH: 26.3 pg — ABNORMAL LOW (ref 27.0–33.0)
MCHC: 33.4 g/dL (ref 32.0–36.0)
MCV: 78.6 fL — ABNORMAL LOW (ref 80.0–100.0)
MPV: 10.3 fL (ref 7.5–12.5)
Monocytes Relative: 9.2 %
Neutro Abs: 4892 cells/uL (ref 1500–7800)
Neutrophils Relative %: 68.9 %
Platelets: 421 10*3/uL — ABNORMAL HIGH (ref 140–400)
RBC: 4.87 10*6/uL (ref 3.80–5.10)
RDW: 13.5 % (ref 11.0–15.0)
Total Lymphocyte: 21.5 %
WBC: 7.1 10*3/uL (ref 3.8–10.8)

## 2020-03-20 MED ORDER — TRAZODONE HCL 50 MG PO TABS: 50.0000 mg | ORAL_TABLET | Freq: Every day | ORAL | 0 refills | Status: DC

## 2020-03-20 MED ORDER — TRAZODONE HCL 50 MG PO TABS: ORAL_TABLET | ORAL | 0 refills | Status: DC

## 2020-03-20 NOTE — Patient Instructions (Signed)
Syncope Syncope is when you pass out (faint) for a short time. It is caused by a sudden decrease in blood flow to the brain. Signs that you may be about to pass out include:  Feeling dizzy or light-headed.  Feeling sick to your stomach (nauseous).  Seeing all white or all black.  Having cold, clammy skin. If you pass out, get help right away. Call your local emergency services (911 in the [place] Do not drive yourself to the hospital. Follow these instructions at home: Watch for any changes in your symptoms. Take these actions to stay safe and help with your symptoms: Lifestyle  Do not drive, use machinery, or play sports until your doctor says it is okay.  Do not drink alcohol.  Do not use any products that contain nicotine or tobacco, such as cigarettes and e-cigarettes. If you need help quitting, ask your doctor.  Drink enough fluid to keep your pee (urine) pale yellow. General instructions  Take over-the-counter and prescription medicines only as told by your doctor.  If you are taking blood pressure or heart medicine, sit up and stand up slowly. Spend a few minutes getting ready to sit and then stand. This can help you feel less dizzy.  Have someone stay with you until you feel stable.  If you start to feel like you might pass out, lie down right away and raise (elevate) your feet above the level of your heart. Breathe deeply and steadily. Wait until all of the symptoms are gone.  Keep all follow-up visits as told by your doctor. This is important. Get help right away if:  You have a very bad headache.  You pass out once or more than once.  You have pain in your chest, belly, or back.  You have a very fast or uneven heartbeat (palpitations).  It hurts to breathe.  You are bleeding from your mouth or your bottom (rectum).  You have black or tarry poop (stool).  You have jerky movements that you cannot control (seizure).  You are confused.  You have trouble  walking.  You are very weak.  You have vision problems. These symptoms may be an emergency. Do not wait to see if the symptoms will go away. Get medical help right away. Call your local emergency services (911 in the [place] Do not drive yourself to the hospital. Summary  Syncope is when you pass out (faint) for a short time. It is caused by a sudden decrease in blood flow to the brain.  Signs that you may be about to faint include feeling dizzy, light-headed, or sick to your stomach, seeing all white or all black, or having cold, clammy skin.  If you start to feel like you might pass out, lie down right away and raise (elevate) your feet above the level of your heart. Breathe deeply and steadily. Wait until all of the symptoms are gone. This information is not intended to replace advice given to you by your health care provider. Make sure you discuss any questions you have with your health care provider. Document Revised: 02/21/2019 Document Reviewed: 02/23/2017 Elsevier Patient Education  2021 Elsevier Inc.  

## 2020-03-20 NOTE — Progress Notes (Signed)
   History of Present Illness:      Patient is a delightful 85 yo recently [facility].  On Feb 20, her husband apparently collapsed -> unresponsive, had CPR and never became revived to alertness  & expired during heart cath on 5/21.  Since then, patient has been experiencing severe anxiety with insomnia.  Two daughters from out of town are staying with her & are very supportive. They report she's had a couple of near faint episodes.   Medications  .  Fe Fum-FePoly-FA-Vit C-Vit B3 (FOLIVANE-F) 125-1 MG CAPS, Take 1 capsule Daily  . VITAMIN D , Take 5,000 Units by mouth daily.  Marland Kitchen  LORazepam (ATIVAN) 1 MG tablet, Take 1 mg by mouth 2 (two) times daily.  Marland Kitchen  venlafaxine XR (EFFEXOR-XR) 150 MG 24 hr capsule, Take 300 mg by mouth at bedtime.   Problem list She has Hx of adenomatous colonic polyps; Iron deficiency anemia; Chronic anxiety; Abnormal glucose; Hyperlipidemia, mixed; Labile hypertension; Vitamin D deficiency; Medication management; Hiatal hernia; and S/P lumpectomy, right breast on their problem list.   Observations/Objective:   BP 128/82   P 83   T 97.9 F    R 16   Ht 5\' 5"     Wt 151 lb 12.8 oz    SpO2 97%   BMI 25.26   Postural     Sitting  BP 156/100   P 85       &         Standing  BP  150/98   P  95   HEENT - WNL. Neck - supple.  Chest - Clear equal BS. Cor - Nl HS. RRR w/o sig MGR. PP 1(+). No edema. MS- FROM w/o deformities.  Gait Nl. Neuro -  Nl w/o focal abnormalities.  Assessment and Plan:   1. Labile hypertension  - ROV in 7-10 days to re-evaluate BP  - Defer starting BP Meds til return OV  - CBC with Differential/Platelet - COMPLETE METABOLIC PANEL WITH GFR  2. Syncope  - CBC with Differential/Platelet - COMPLETE METABOLIC PANEL WITH GFR  3. Adjustment insomnia   4. Medication management  - CBC with Differential/Platelet - COMPLETE METABOLIC PANEL WITH GFR   Follow Up Instructions:       I discussed the assessment and treatment plan with the  patient. The patient was provided an opportunity to ask questions and all were answered. The patient agreed with the plan and demonstrated an understanding of the instructions.      The patient was advised to call back or seek an in-person evaluation if the symptoms worsen or if the condition fails to improve as anticipated.    Kirtland Bouchard, MD

## 2020-03-21 NOTE — Progress Notes (Signed)
============================================================== ==============================================================  -    CBC is OK   - Normal Hgb / Red cell count - No Anemia   &   - WBC is Normal - No sign of Infection ============================================================== ==============================================================  - Chem / 24 - All OK - Glucose -  Kidneys - Electrolytes - Liver  - all  Normal / OK  ==============================================================

## 2020-03-22 ENCOUNTER — Encounter: Payer: Self-pay | Admitting: Internal Medicine

## 2020-03-27 ENCOUNTER — Ambulatory Visit: Payer: Medicare Other | Admitting: Internal Medicine

## 2020-03-27 NOTE — Progress Notes (Deleted)
Assessment and Plan:  There are no diagnoses linked to this encounter.    Further disposition pending results of labs. Discussed med's effects and SE's.   Over 30 minutes of exam, counseling, chart review, and critical decision making was performed.   Future Appointments  Date Time Provider [place]  03/28/2020 11:30 AM Liane Comber, NP [facility] None  05/01/2020 11:30 AM Unk Pinto, MD [facility] None  12/29/2020  3:00 PM Garnet Sierras, NP [facility] None    ------------------------------------------------------------------------------------------------------------------   HPI There were no vitals taken for this visit.  85 y.o.female presents for evaluation of anxiety, her husband passed unexpectedly Feb 2022, was having some syncopal episodes and elevated BP when Dr. Melford Aase saw her 03/20/2020, had normal CBC, CMP/GFR at that time. Presents for follow up ***   She has been prescribed effexor 150 mg caps *** ? 300 mg - we haven't filled, who gave originally Dr. Toy Care?   Trazodone  Ativan without recent fills - 06/2019 last refill, ? Dr. Ardis Rowan   Past Medical History:  Diagnosis Date  . Allergic rhinitis   . Anemia   . Anxiety   . Arthritis   . Depression   . GERD (gastroesophageal reflux disease)   . Hyperlipidemia   . Hypertension   . Migraine   . Prediabetes   . Sleep apnea    no cpap  . Vitamin D deficiency      Allergies  Allergen Reactions  . Morphine And Related     Unknown     Current Outpatient Medications on File Prior to Visit  Medication Sig  . Cholecalciferol (VITAMIN D PO) Take 5,000 Units by mouth daily.   . Fe Fum-FePoly-FA-Vit C-Vit B3 (FOLIVANE-F) 125-1 MG CAPS Take 1 capsule Daily  . LORazepam (ATIVAN) 1 MG tablet Take 1 mg by mouth 2 (two) times daily.  . traZODone (DESYREL) 50 MG tablet Take  1 to 2 tablets  1 hour  before Bedtime as needed for Sleep  . venlafaxine XR (EFFEXOR-XR) 150 MG 24 hr capsule Take 300 mg by  mouth at bedtime.    No current facility-administered medications on file prior to visit.    ROS: all negative except above.   Physical Exam:  There were no vitals taken for this visit.  General Appearance: Well nourished, in no apparent distress. Eyes: PERRLA, EOMs, conjunctiva no swelling or erythema Sinuses: No Frontal/maxillary tenderness ENT/Mouth: Ext aud canals clear, TMs without erythema, bulging. No erythema, swelling, or exudate on post pharynx.  Tonsils not swollen or erythematous. Hearing normal.  Neck: Supple, thyroid normal.  Respiratory: Respiratory effort normal, BS equal bilaterally without rales, rhonchi, wheezing or stridor.  Cardio: RRR with no MRGs. Brisk peripheral pulses without edema.  Abdomen: Soft, + BS.  Non tender, no guarding, rebound, hernias, masses. Lymphatics: Non tender without lymphadenopathy.  Musculoskeletal: Full ROM, 5/5 strength, normal gait.  Skin: Warm, dry without rashes, lesions, ecchymosis.  Neuro: Cranial nerves intact. Normal muscle tone, no cerebellar symptoms. Sensation intact.  Psych: Awake and oriented X 3, normal affect, Insight and Judgment appropriate.     [name], NP 5:24 PM [facility]

## 2020-03-28 ENCOUNTER — Ambulatory Visit: Payer: Medicare Other | Admitting: Adult Health

## 2020-04-01 NOTE — Progress Notes (Signed)
Assessment and Plan:  Stephanie Roach was seen today for depression.  Diagnoses and all orders for this visit:  Grief reaction Chronic anxiety Improved; denies further syncopal episodes, dizziness  continue medications; Dr. Ardis Roach managing, has follow up - she might call to see if can be seen sooner, or if any cancellations Continue grief counseling via church Encouraged her to spend time with local family and friends, start taking walks with grandsons Stress management techniques discussed, increase water, good sleep hygiene discussed, increase exercise, and increase veggies.  Follow up in 1 month or sooner if needed.   Further disposition pending results of labs. Discussed med's effects and SE's.   Over 15 minutes of exam, counseling, chart review, and critical decision making was performed.   Future Appointments  Date Time Provider Stephanie Roach  05/01/2020 11:30 AM Stephanie Pinto, MD [facility] None  12/29/2020  3:00 PM Stephanie Roach, Stephanie Sewer, NP [facility] None    ------------------------------------------------------------------------------------------------------------------   HPI BP 120/64    Pulse 92    Temp (!) 96.8 F (36 C)    Wt 150 lb (68 kg)    SpO2 98%    BMI 24.96 kg/m   85 y.o.female presents for evaluation of anxiety, her husband passed unexpectedly Feb 2022, was having some syncopal episodes and elevated BP when Dr. Melford Roach saw her 03/20/2020, had normal CBC, CMP/GFR at that time. She reports no syncopal episodes or dizziness since that time. Does continue to express frequent sad/down days. She reports has been speaking with counselor at church a few days a week which does help.   She has been prescribed effexor 150 mg caps, has been on for very long time by Dr. Harriet Roach, reports has appointment end of April. She is also prescribed trazodone 50 mg for sleep which does work well. She is also prescribed ativan 1 mg PRN.   She denies ETOH, drugs, SI/HI.    Past Medical  History:  Diagnosis Date   Allergic rhinitis    Anemia    Anxiety    Arthritis    Depression    GERD (gastroesophageal reflux disease)    Hyperlipidemia    Hypertension    Migraine    Prediabetes    Sleep apnea    no cpap   Vitamin D deficiency      Allergies  Allergen Reactions   Morphine And Related     Unknown     Current Outpatient Medications on File Prior to Visit  Medication Sig   Cholecalciferol (VITAMIN D PO) Take 5,000 Units by mouth daily.    Fe Fum-FePoly-FA-Vit C-Vit B3 (FOLIVANE-F) 125-1 MG CAPS Take 1 capsule Daily   LORazepam (ATIVAN) 1 MG tablet Take 1 mg by mouth 2 (two) times daily.   traZODone (DESYREL) 50 MG tablet Take  1 to 2 tablets  1 hour  before Bedtime as needed for Sleep   venlafaxine XR (EFFEXOR-XR) 150 MG 24 hr capsule Take 150 mg by mouth at bedtime.   No current facility-administered medications on file prior to visit.    ROS: all negative except above.   Physical Exam:  BP 120/64    Pulse 92    Temp (!) 96.8 F (36 C)    Wt 150 lb (68 kg)    SpO2 98%    BMI 24.96 kg/m   General Appearance: Well nourished, in no apparent distress. Eyes: PERRLA, conjunctiva no swelling or erythema ENT/Mouth: Ext aud canals clear, TMs without erythema, bulging. No erythema, swelling, or exudate on post pharynx.  Tonsils not swollen or erythematous. Hearing normal.  Neck: Supple, thyroid normal.  Respiratory: Respiratory effort normal, BS equal bilaterally without rales, rhonchi, wheezing or stridor.  Cardio: RRR with no MRGs. Brisk peripheral pulses without edema.  Abdomen: Soft, + BS.  Non tender Lymphatics: Non tender without lymphadenopathy.  Musculoskeletal: normal gait.  Skin: Warm, dry without rashes, lesions, ecchymosis.  Neuro: Normal muscle tone Psych: Awake and oriented X 3, depressed affect, Insight and Judgment appropriate. No SI/HI.     Stephanie Ribas, NP 2:35 PM [facility]

## 2020-04-02 ENCOUNTER — Encounter: Payer: Self-pay | Admitting: Adult Health

## 2020-04-02 ENCOUNTER — Other Ambulatory Visit: Payer: Self-pay

## 2020-04-02 ENCOUNTER — Ambulatory Visit (INDEPENDENT_AMBULATORY_CARE_PROVIDER_SITE_OTHER): Payer: Medicare Other | Admitting: Adult Health

## 2020-04-02 VITALS — BP 120/64 | HR 92 | Temp 96.8°F | Wt 150.0 lb

## 2020-04-02 DIAGNOSIS — F4321 Adjustment disorder with depressed mood: Secondary | ICD-10-CM | POA: Insufficient documentation

## 2020-04-02 DIAGNOSIS — F419 Anxiety disorder, unspecified: Secondary | ICD-10-CM

## 2020-04-08 DIAGNOSIS — Z961 Presence of intraocular lens: Secondary | ICD-10-CM | POA: Diagnosis not present

## 2020-04-08 DIAGNOSIS — H1013 Acute atopic conjunctivitis, bilateral: Secondary | ICD-10-CM | POA: Diagnosis not present

## 2020-04-08 DIAGNOSIS — H26492 Other secondary cataract, left eye: Secondary | ICD-10-CM | POA: Diagnosis not present

## 2020-04-08 DIAGNOSIS — H04123 Dry eye syndrome of bilateral lacrimal glands: Secondary | ICD-10-CM | POA: Diagnosis not present

## 2020-04-30 ENCOUNTER — Encounter: Payer: Self-pay | Admitting: Internal Medicine

## 2020-04-30 NOTE — Progress Notes (Signed)
                                                                                                                                  This very nice 85 y.o.female presents for 3 month follow up with HTN, HLD, Pre-Diabetes and Vitamin D Deficiency.        Patient is treated for HTN & BP has been controlled at home. Today's  . Patient has had no complaints of any cardiac type chest pain, palpitations, dyspnea / orthopnea / PND, dizziness, claudication, or dependent edema.       Hyperlipidemia is controlled with diet & meds. Patient denies myalgias or other med SE's. Last Lipids were  Lab Results  Component Value Date   CHOL 228 (H) 09/19/2019   HDL 74 09/19/2019   LDLCALC 128 (H) 09/19/2019   TRIG 151 (H) 09/19/2019   CHOLHDL 3.1 09/19/2019     Also, the patient has history of PreDiabetes and has had no symptoms of reactive hypoglycemia, diabetic polys, paresthesias or visual blurring.  Last A1c was   Lab Results  Component Value Date   HGBA1C 6.4 (H) 12/27/2019        Further, the patient also has history of Vitamin D Deficiency and supplements vitamin D without any suspected side-effects. Last vitamin D was  Lab Results  Component Value Date   VD25OH 70 09/19/2019

## 2020-05-01 ENCOUNTER — Ambulatory Visit: Payer: Medicare Other | Admitting: Internal Medicine

## 2020-05-01 DIAGNOSIS — R7309 Other abnormal glucose: Secondary | ICD-10-CM

## 2020-05-01 DIAGNOSIS — E559 Vitamin D deficiency, unspecified: Secondary | ICD-10-CM

## 2020-05-01 DIAGNOSIS — Z79899 Other long term (current) drug therapy: Secondary | ICD-10-CM

## 2020-05-01 DIAGNOSIS — R0989 Other specified symptoms and signs involving the circulatory and respiratory systems: Secondary | ICD-10-CM

## 2020-05-01 DIAGNOSIS — E782 Mixed hyperlipidemia: Secondary | ICD-10-CM

## 2020-05-15 ENCOUNTER — Ambulatory Visit (INDEPENDENT_AMBULATORY_CARE_PROVIDER_SITE_OTHER): Payer: Medicare Other | Admitting: Internal Medicine

## 2020-05-15 ENCOUNTER — Other Ambulatory Visit: Payer: Self-pay

## 2020-05-15 VITALS — BP 120/72 | HR 96 | Temp 97.7°F | Resp 16 | Ht 65.0 in | Wt 152.0 lb

## 2020-05-15 DIAGNOSIS — R0989 Other specified symptoms and signs involving the circulatory and respiratory systems: Secondary | ICD-10-CM | POA: Diagnosis not present

## 2020-05-15 DIAGNOSIS — Z79899 Other long term (current) drug therapy: Secondary | ICD-10-CM | POA: Diagnosis not present

## 2020-05-15 DIAGNOSIS — E782 Mixed hyperlipidemia: Secondary | ICD-10-CM

## 2020-05-15 DIAGNOSIS — E559 Vitamin D deficiency, unspecified: Secondary | ICD-10-CM

## 2020-05-15 DIAGNOSIS — R7309 Other abnormal glucose: Secondary | ICD-10-CM | POA: Diagnosis not present

## 2020-05-15 NOTE — Progress Notes (Signed)
Future Appointments  Date Time Provider [place]  05/15/2020  4:30 PM Unk Pinto, MD [facility] None  12/29/2020  3:00 PM Garnet Sierras, NP [facility] None     History of Present Illness:       This very nice 85 y.o.WWF presents for 3 month follow up with HTN, HLD, Pre-Diabetes and Vitamin D Deficiency.        Patient is treated for HTN & BP has been controlled at home. Today's BP is at goal -  120/72. Patient has had no complaints of any cardiac type chest pain, palpitations, dyspnea / orthopnea / PND, dizziness, claudication, or dependent edema.       Hyperlipidemia is controlled with diet & meds. Patient denies myalgias or other med SE's. Last Lipids were  Lab Results  Component Value Date   CHOL 228 (H) 09/19/2019   HDL 74 09/19/2019   LDLCALC 128 (H) 09/19/2019   TRIG 151 (H) 09/19/2019   CHOLHDL 3.1 09/19/2019     Also, the patient has history of T2_NIDDM PreDiabetes and has had no symptoms of reactive hypoglycemia, diabetic polys, paresthesias or visual blurring.  Last A1c was elevated:  Lab Results  Component Value Date   HGBA1C 6.4 (H) 12/27/2019        Further, the patient also has history of Vitamin D Deficiency and supplements vitamin D without any suspected side-effects. Last vitamin D was at goal:  Lab Results  Component Value Date   VD25OH 6 09/19/2019    Current Outpatient Medications on File Prior to Visit  Medication Sig  . VITAMIN D 5,000 Units Take daily.   . Fe Fum-FePoly-FA-Vit C-Vit B3  (FOLIVANE-F) 125-1 MG CAPS Take 1 capsule Daily  . LORazepam  1 MG tablet Take  1 tablet 2 times daily.  . traZODone  50 MG tablet Take  1 to 2 tabs  1 hr  before Bedtime as needed for Sleep  . venlafaxine-XR 150 MG  Take at bedtime.     Allergies  Allergen Reactions  . Morphine And Related Unknown    PMHx:   Past Medical History:  Diagnosis Date  . Allergic rhinitis   . Anemia   . Anxiety   . Arthritis   . Depression    . GERD (gastroesophageal reflux disease)   . Hyperlipidemia   . Hypertension   . Migraine   . Prediabetes   . Sleep apnea    no cpap  . Vitamin D deficiency     Immunization History  Administered Date(s) Administered  . Influenza, High Dose Seasonal PF 12/29/2018  . PFIZER  SARS-COV-2 Vacc 05/07/2019, 05/30/2019  . Pneumococcal Conjugate-13 08/27/2013  . Pneumococcal Polysaccharide-23 10/23/2015  . Pneumococcal-Unspecified 12/06/2001  . Tdap 08/05/2012  . Zoster 02/05/2010    Past Surgical History:  Procedure Laterality Date  . BREAST BIOPSY Right 10/19/2018   FIBROCYSTIC CHANGE WITH USUAL DUCTAL HYPERPLASIA,  . BREAST EXCISIONAL BIOPSY Right 07/05/2019   Negative for in situ or invasive carcinoma  . BREAST LUMPECTOMY WITH RADIOACTIVE SEED LOCALIZATION Right 07/05/2019   Procedure: RIGHT BREAST LUMPECTOMY WITH RADIOACTIVE SEED LOCALIZATION;  Surgeon: Coralie Keens, MD;  Location: [facility];  Service: General;  Laterality: Right;  . BUNIONECTOMY    . CATARACT EXTRACTION Bilateral   . CHOLECYSTECTOMY  2003  . KNEE ARTHROSCOPY    . WRIST FRACTURE SURGERY      FHx:    Reviewed / unchanged  SHx:    Reviewed / unchanged  Systems Review:  Constitutional: Denies fever, chills, wt changes, headaches, insomnia, fatigue, night sweats, change in appetite. Eyes: Denies redness, blurred vision, diplopia, discharge, itchy, watery eyes.  ENT: Denies discharge, congestion, post nasal drip, epistaxis, sore throat, earache, hearing loss, dental pain, tinnitus, vertigo, sinus pain, snoring.  CV: Denies chest pain, palpitations, irregular heartbeat, syncope, dyspnea, diaphoresis, orthopnea, PND, claudication or edema. Respiratory: denies cough, dyspnea, DOE, pleurisy, hoarseness, laryngitis, wheezing.  Gastrointestinal: Denies dysphagia, odynophagia, heartburn, reflux, water brash, abdominal pain or cramps, nausea, vomiting, bloating, diarrhea, constipation, hematemesis, melena,  hematochezia  or hemorrhoids. Genitourinary: Denies dysuria, frequency, urgency, nocturia, hesitancy, discharge, hematuria or flank pain. Musculoskeletal: Denies arthralgias, myalgias, stiffness, jt. swelling, pain, limping or strain/sprain.  Skin: Denies pruritus, rash, hives, warts, acne, eczema or change in skin lesion(s). Neuro: No weakness, tremor, incoordination, spasms, paresthesia or pain. Psychiatric: Denies confusion, memory loss or sensory loss. Endo: Denies change in weight, skin or hair change.  Heme/Lymph: No excessive bleeding, bruising or enlarged lymph nodes.  Physical Exam  BP 120/72   Pulse 96   Temp 97.7 F (36.5 C)   Resp 16   Ht 5\' 5"  (1.651 m)   Wt 152 lb (68.9 kg)   SpO2 99%   BMI 25.29 kg/m   Appears  well nourished, well groomed  and in no distress.  Eyes: PERRLA, EOMs, conjunctiva no swelling or erythema. Sinuses: No frontal/maxillary tenderness ENT/Mouth: EAC's clear, TM's nl w/o erythema, bulging. Nares clear w/o erythema, swelling, exudates. Oropharynx clear without erythema or exudates. Oral hygiene is good. Tongue normal, non obstructing. Hearing intact.  Neck: Supple. Thyroid not palpable. Car 2+/2+ without bruits, nodes or JVD. Chest: Respirations nl with BS clear & equal w/o rales, rhonchi, wheezing or stridor.  Cor: Heart sounds normal w/ regular rate and rhythm without sig. murmurs, gallops, clicks or rubs. Peripheral pulses normal and equal  without edema.  Abdomen: Soft & bowel sounds normal. Non-tender w/o guarding, rebound, hernias, masses or organomegaly.  Lymphatics: Unremarkable.  Musculoskeletal: Full ROM all peripheral extremities, joint stability, 5/5 strength and normal gait.  Skin: Warm, dry without exposed rashes, lesions or ecchymosis apparent.  Neuro: Cranial nerves intact, reflexes equal bilaterally. Sensory-motor testing grossly intact. Tendon reflexes grossly intact.  Pysch: Alert & oriented x 3.  Insight and judgement nl &  appropriate. No ideations.  Assessment and Plan:  1. Labile hypertension  - Continue medication, monitor blood pressure at home.  - Continue DASH diet.  Reminder to go to the ER if any CP,  SOB, nausea, dizziness, severe HA, changes vision/speech.  - CBC with Differential/Platelet - COMPLETE METABOLIC PANEL WITH GFR - Magnesium - TSH  2. Hyperlipidemia, mixed  - Continue diet/meds, exercise,& lifestyle modifications.  - Continue monitor periodic cholesterol/liver & renal functions   - Lipid panel - TSH  3. Abnormal glucose  - Continue diet, exercise  - Lifestyle modifications.  - Monitor appropriate labs  - Hemoglobin A1c - Insulin, random  4. Vitamin D deficiency  - Continue supplementation.  - VITAMIN D 25 Hydroxy  5. Medication management  - CBC with Differential/Platelet - COMPLETE METABOLIC PANEL WITH GFR - Magnesium - Lipid panel - TSH - Hemoglobin A1c - Insulin, random - VITAMIN D 25 Hydroxy          Discussed  regular exercise, BP monitoring, weight control to achieve/maintain BMI less than 25 and discussed med and SE's. Recommended labs to assess and monitor clinical status with further disposition pending results of labs.  I discussed  the assessment and treatment plan with the patient. The patient was provided an opportunity to ask questions and all were answered. The patient agreed with the plan and demonstrated an understanding of the instructions.  I provided over 30 minutes of exam, counseling, chart review and  complex critical decision making.           The patient was advised to call back or seek an in-person evaluation if the symptoms worsen or if the condition fails to improve as anticipated.   Kirtland Bouchard, MD

## 2020-05-15 NOTE — Patient Instructions (Signed)

## 2020-05-16 ENCOUNTER — Encounter: Payer: Self-pay | Admitting: Internal Medicine

## 2020-05-16 ENCOUNTER — Other Ambulatory Visit: Payer: Self-pay | Admitting: Internal Medicine

## 2020-05-16 DIAGNOSIS — D508 Other iron deficiency anemias: Secondary | ICD-10-CM

## 2020-05-16 LAB — COMPLETE METABOLIC PANEL WITH GFR
AG Ratio: 1.8 (calc) (ref 1.0–2.5)
ALT: 12 U/L (ref 6–29)
AST: 15 U/L (ref 10–35)
Albumin: 4.3 g/dL (ref 3.6–5.1)
Alkaline phosphatase (APISO): 84 U/L (ref 37–153)
BUN: 13 mg/dL (ref 7–25)
CO2: 28 mmol/L (ref 20–32)
Calcium: 9.2 mg/dL (ref 8.6–10.4)
Chloride: 103 mmol/L (ref 98–110)
Creat: 0.81 mg/dL (ref 0.60–0.88)
GFR, Est African American: 78 mL/min/{1.73_m2} (ref >=60)
GFR, Est Non African American: 68 mL/min/{1.73_m2} (ref >=60)
Globulin: 2.4 g/dL (calc) (ref 1.9–3.7)
Glucose, Bld: 98 mg/dL (ref 65–99)
Potassium: 3.7 mmol/L (ref 3.5–5.3)
Sodium: 139 mmol/L (ref 135–146)
Total Bilirubin: 0.3 mg/dL (ref 0.2–1.2)
Total Protein: 6.7 g/dL (ref 6.1–8.1)

## 2020-05-16 LAB — CBC WITH DIFFERENTIAL/PLATELET
Absolute Monocytes: 570 cells/uL (ref 200–950)
Basophils Absolute: 30 cells/uL (ref 0–200)
Basophils Relative: 0.5 %
Eosinophils Absolute: 0 cells/uL — ABNORMAL LOW (ref 15–500)
Eosinophils Relative: 0 %
HCT: 33.1 % — ABNORMAL LOW (ref 35.0–45.0)
Hemoglobin: 10.2 g/dL — ABNORMAL LOW (ref 11.7–15.5)
Lymphs Abs: 1452 cells/uL (ref 850–3900)
MCH: 23.2 pg — ABNORMAL LOW (ref 27.0–33.0)
MCHC: 30.8 g/dL — ABNORMAL LOW (ref 32.0–36.0)
MCV: 75.2 fL — ABNORMAL LOW (ref 80.0–100.0)
MPV: 10.5 fL (ref 7.5–12.5)
Monocytes Relative: 9.5 %
Neutro Abs: 3948 cells/uL (ref 1500–7800)
Neutrophils Relative %: 65.8 %
Platelets: 413 10*3/uL — ABNORMAL HIGH (ref 140–400)
RBC: 4.4 10*6/uL (ref 3.80–5.10)
RDW: 13.4 % (ref 11.0–15.0)
Total Lymphocyte: 24.2 %
WBC: 6 10*3/uL (ref 3.8–10.8)

## 2020-05-16 LAB — LIPID PANEL
Cholesterol: 235 mg/dL — ABNORMAL HIGH (ref <200)
HDL: 68 mg/dL (ref >=50)
LDL Cholesterol (Calc): 133 mg/dL (calc) — ABNORMAL HIGH
Non-HDL Cholesterol (Calc): 167 mg/dL (calc) — ABNORMAL HIGH (ref <130)
Total CHOL/HDL Ratio: 3.5 (calc) (ref <5.0)
Triglycerides: 196 mg/dL — ABNORMAL HIGH (ref <150)

## 2020-05-16 LAB — TSH: TSH: 2.31 mIU/L (ref 0.40–4.50)

## 2020-05-16 LAB — INSULIN, RANDOM: Insulin: 13.4 u[IU]/mL

## 2020-05-16 LAB — HEMOGLOBIN A1C
Hgb A1c MFr Bld: 6.3 % of total Hgb — ABNORMAL HIGH (ref <5.7)
Mean Plasma Glucose: 134 mg/dL
eAG (mmol/L): 7.4 mmol/L

## 2020-05-16 LAB — MAGNESIUM: Magnesium: 1.8 mg/dL (ref 1.5–2.5)

## 2020-05-16 LAB — VITAMIN D 25 HYDROXY (VIT D DEFICIENCY, FRACTURES): Vit D, 25-Hydroxy: 71 ng/mL (ref 30–100)

## 2020-08-02 ENCOUNTER — Encounter (HOSPITAL_COMMUNITY): Payer: Self-pay

## 2020-08-04 NOTE — Progress Notes (Signed)
Assessment and Plan: Stephanie Roach was seen today for swollen area on mid back.  Diagnoses and all orders for this visit:  Lipoma of back - Will perform observation, if any changes, grows larger or becomes irritated or painful she is to call the office     Other iron deficiency anemia -     Fe Fum-FePoly-FA-Vit C-Vit B3 (FOLIVANE-F) 125-1 MG CAPS; Take 1 capsule Daily     Further disposition pending results of labs. Discussed med's effects and SE's.   Over 15 minutes of exam, counseling, chart review, and critical decision making was performed.   Future Appointments  Date Time Provider [place]  12/31/2020 10:00 AM Liane Comber, NP [facility] None    ------------------------------------------------------------------------------------------------------------------   HPI BP 134/76   Pulse 82   Temp (!) 97.3 F (36.3 C)   Wt 147 lb (66.7 kg)   SpO2 95%   BMI 24.46 kg/m   85 y.o.female presents for evaluation of lump on her back.  She has noticed the area on right upper back for 2-3 weeks.  States the area is nontender and does not believe it has changed in size in any way.  She did fall on her back several months ago and had some tenderness in that area and is questioning if that is what caused it.    Depression is fairly well controlled at present on Effexor XR 150 mg.  No suicidal thoughts.   Pt is taking Folivane- F for anemia, tolerating medication well. Will recheck lab at next visit   Lab Results  Component Value Date   WBC 6.0 05/15/2020   HGB 10.2 (L) 05/15/2020   HCT 33.1 (L) 05/15/2020   MCV 75.2 (L) 05/15/2020   PLT 413 (H) 05/15/2020    She denies ETOH, drugs, SI/HI.    Past Medical History:  Diagnosis Date   Allergic rhinitis    Anemia    Anxiety    Arthritis    Depression    GERD (gastroesophageal reflux disease)    Hyperlipidemia    Hypertension    Migraine    Prediabetes    Sleep apnea    no cpap   Vitamin D deficiency       Allergies  Allergen Reactions   Morphine And Related     Unknown     Current Outpatient Medications on File Prior to Visit  Medication Sig   Cholecalciferol (VITAMIN D PO) Take 5,000 Units by mouth daily.    LORazepam (ATIVAN) 1 MG tablet Take 1 mg by mouth 2 (two) times daily.   traZODone (DESYREL) 50 MG tablet Take  1 to 2 tablets  1 hour  before Bedtime as needed for Sleep   venlafaxine XR (EFFEXOR-XR) 150 MG 24 hr capsule Take 150 mg by mouth at bedtime.   No current facility-administered medications on file prior to visit.    ROS: all negative except above.   Physical Exam:  BP 134/76   Pulse 82   Temp (!) 97.3 F (36.3 C)   Wt 147 lb (66.7 kg)   SpO2 95%   BMI 24.46 kg/m   General Appearance: Well nourished, in no apparent distress. Eyes: PERRLA, conjunctiva no swelling or erythema ENT/Mouth: Ext aud canals clear, TMs without erythema, bulging. No erythema, swelling, or exudate on post pharynx.  Tonsils not swollen or erythematous. Hearing normal.  Neck: Supple, thyroid normal.  Respiratory: Respiratory effort normal, BS equal bilaterally without rales, rhonchi, wheezing or stridor.  Cardio: RRR with no MRGs.  Brisk peripheral pulses without edema.  Abdomen: Soft, + BS.  Non tender Lymphatics: Non tender without lymphadenopathy.  Musculoskeletal: normal gait.  Skin: 2cm soft moveable mass of right mid back  Neuro: Normal muscle tone Psych: Awake and oriented X 3, depressed affect, Insight and Judgment appropriate. No SI/HI.     Magda Bernheim, NP 12:17 PM [facility]

## 2020-08-05 ENCOUNTER — Other Ambulatory Visit: Payer: Self-pay

## 2020-08-05 ENCOUNTER — Encounter: Payer: Self-pay | Admitting: Nurse Practitioner

## 2020-08-05 ENCOUNTER — Ambulatory Visit (INDEPENDENT_AMBULATORY_CARE_PROVIDER_SITE_OTHER): Payer: Medicare Other | Admitting: Nurse Practitioner

## 2020-08-05 VITALS — BP 134/76 | HR 82 | Temp 97.3°F | Wt 147.0 lb

## 2020-08-05 DIAGNOSIS — D508 Other iron deficiency anemias: Secondary | ICD-10-CM

## 2020-08-05 DIAGNOSIS — D171 Benign lipomatous neoplasm of skin and subcutaneous tissue of trunk: Secondary | ICD-10-CM | POA: Diagnosis not present

## 2020-08-05 MED ORDER — FOLIVANE-F 125-1 MG PO CAPS: ORAL_CAPSULE | ORAL | 1 refills | Status: DC

## 2020-08-05 NOTE — Patient Instructions (Signed)
Lipoma  A lipoma is a noncancerous (benign) tumor that is made up of fat cells. This is a very common type of soft-tissue growth. Lipomas are usually found under the skin (subcutaneous). They may occur in any tissue of the body that contains fat. Common areas forlipomas to appear include the back, arms, shoulders, buttocks, and thighs. Lipomas grow slowly, and they are usually painless. Most lipomas do not causeproblems and do not require treatment. What are the causes? The cause of this condition is not known. What increases the risk? You are more likely to develop this condition if: You are 40-60 years old. You have a family history of lipomas. What are the signs or symptoms? A lipoma usually appears as a small, round bump under the skin. In most cases, the lump will: Feel soft or rubbery. Not cause pain or other symptoms. However, if a lipoma is located in an area where it pushes on nerves, it canbecome painful or cause other symptoms. How is this diagnosed? A lipoma can usually be diagnosed with a physical exam. You may also have tests to confirm the diagnosis and to rule out other conditions. Tests may include: Imaging tests, such as a CT scan or an MRI. Removal of a tissue sample to be looked at under a microscope (biopsy). How is this treated? Treatment for this condition depends on the size of the lipoma and whether it is causing any symptoms. For small lipomas that are not causing problems, no treatment is needed. If a lipoma is bigger or it causes problems, surgery may be done to remove the lipoma. Lipomas can also be removed to improve appearance. Most often, the procedure is done after applying a medicine that numbs the area (local anesthetic). Liposuction may be done to reduce the size of the lipoma before it is removed through surgery, or it may be done to remove the lipoma. Lipomas are removed with this method in order to limit incision size and scarring. A liposuction tube is  inserted through a small incision into the lipoma, and the contents of the lipoma are removed through the tube with suction. Follow these instructions at home: Watch your lipoma for any changes. Keep all follow-up visits as told by your health care provider. This is important. Contact a health care provider if: Your lipoma becomes larger or hard. Your lipoma becomes painful, red, or increasingly swollen. These could be signs of infection or a more serious condition. Get help right away if: You develop tingling or numbness in an area near the lipoma. This could indicate that the lipoma is causing nerve damage. Summary A lipoma is a noncancerous tumor that is made up of fat cells. Most lipomas do not cause problems and do not require treatment. If a lipoma is bigger or it causes problems, surgery may be done to remove the lipoma. Contact a health care provider if your lipoma becomes larger or hard, or if it becomes painful, red, or increasingly swollen. Pain, redness, and swelling could be signs of infection or a more serious condition. This information is not intended to replace advice given to you by your health care provider. Make sure you discuss any questions you have with your healthcare provider. Document Revised: 08/28/2018 Document Reviewed: 08/28/2018 Elsevier Patient Education  2022 [employer] Inc.  

## 2020-09-11 ENCOUNTER — Other Ambulatory Visit: Payer: Self-pay | Admitting: Internal Medicine

## 2020-09-11 DIAGNOSIS — Z1231 Encounter for screening mammogram for malignant neoplasm of breast: Secondary | ICD-10-CM

## 2020-09-26 DIAGNOSIS — Z20822 Contact with and (suspected) exposure to covid-19: Secondary | ICD-10-CM | POA: Diagnosis not present

## 2020-10-02 ENCOUNTER — Other Ambulatory Visit: Payer: Self-pay

## 2020-10-02 ENCOUNTER — Ambulatory Visit (INDEPENDENT_AMBULATORY_CARE_PROVIDER_SITE_OTHER): Payer: Medicare Other | Admitting: Nurse Practitioner

## 2020-10-02 ENCOUNTER — Encounter: Payer: Self-pay | Admitting: Nurse Practitioner

## 2020-10-02 VITALS — BP 148/72 | HR 79 | Temp 97.7°F | Wt 148.6 lb

## 2020-10-02 DIAGNOSIS — F32A Depression, unspecified: Secondary | ICD-10-CM | POA: Diagnosis not present

## 2020-10-02 DIAGNOSIS — E782 Mixed hyperlipidemia: Secondary | ICD-10-CM | POA: Diagnosis not present

## 2020-10-02 DIAGNOSIS — F419 Anxiety disorder, unspecified: Secondary | ICD-10-CM | POA: Diagnosis not present

## 2020-10-02 DIAGNOSIS — R0989 Other specified symptoms and signs involving the circulatory and respiratory systems: Secondary | ICD-10-CM

## 2020-10-02 DIAGNOSIS — F320 Major depressive disorder, single episode, mild: Secondary | ICD-10-CM

## 2020-10-02 DIAGNOSIS — R5383 Other fatigue: Secondary | ICD-10-CM

## 2020-10-02 DIAGNOSIS — M25561 Pain in right knee: Secondary | ICD-10-CM | POA: Diagnosis not present

## 2020-10-02 DIAGNOSIS — G8929 Other chronic pain: Secondary | ICD-10-CM | POA: Diagnosis not present

## 2020-10-02 DIAGNOSIS — H6502 Acute serous otitis media, left ear: Secondary | ICD-10-CM

## 2020-10-02 DIAGNOSIS — M25562 Pain in left knee: Secondary | ICD-10-CM

## 2020-10-02 LAB — CBC WITH DIFFERENTIAL/PLATELET
Absolute Monocytes: 448 cells/uL (ref 200–950)
Basophils Absolute: 28 cells/uL (ref 0–200)
Basophils Relative: 0.5 %
Eosinophils Absolute: 11 cells/uL — ABNORMAL LOW (ref 15–500)
Eosinophils Relative: 0.2 %
HCT: 42.5 % (ref 35.0–45.0)
Hemoglobin: 13.7 g/dL (ref 11.7–15.5)
Lymphs Abs: 1086 cells/uL (ref 850–3900)
MCH: 26 pg — ABNORMAL LOW (ref 27.0–33.0)
MCHC: 32.2 g/dL (ref 32.0–36.0)
MCV: 80.6 fL (ref 80.0–100.0)
MPV: 10.7 fL (ref 7.5–12.5)
Monocytes Relative: 8 %
Neutro Abs: 4026 cells/uL (ref 1500–7800)
Neutrophils Relative %: 71.9 %
Platelets: 351 10*3/uL (ref 140–400)
RBC: 5.27 10*6/uL — ABNORMAL HIGH (ref 3.80–5.10)
RDW: 14.5 % (ref 11.0–15.0)
Total Lymphocyte: 19.4 %
WBC: 5.6 10*3/uL (ref 3.8–10.8)

## 2020-10-02 LAB — COMPLETE METABOLIC PANEL WITH GFR
AG Ratio: 1.8 (calc) (ref 1.0–2.5)
ALT: 14 U/L (ref 6–29)
AST: 14 U/L (ref 10–35)
Albumin: 4.5 g/dL (ref 3.6–5.1)
Alkaline phosphatase (APISO): 84 U/L (ref 37–153)
BUN/Creatinine Ratio: 20 (calc) (ref 6–22)
BUN: 12 mg/dL (ref 7–25)
CO2: 27 mmol/L (ref 20–32)
Calcium: 9.5 mg/dL (ref 8.6–10.4)
Chloride: 106 mmol/L (ref 98–110)
Creat: 0.59 mg/dL — ABNORMAL LOW (ref 0.60–0.95)
Globulin: 2.5 g/dL (calc) (ref 1.9–3.7)
Glucose, Bld: 136 mg/dL — ABNORMAL HIGH (ref 65–99)
Potassium: 3.8 mmol/L (ref 3.5–5.3)
Sodium: 142 mmol/L (ref 135–146)
Total Bilirubin: 0.5 mg/dL (ref 0.2–1.2)
Total Protein: 7 g/dL (ref 6.1–8.1)
eGFR: 90 mL/min/{1.73_m2} (ref >=60)

## 2020-10-02 LAB — LIPID PANEL
Cholesterol: 269 mg/dL — ABNORMAL HIGH (ref <200)
HDL: 71 mg/dL (ref >=50)
LDL Cholesterol (Calc): 168 mg/dL (calc) — ABNORMAL HIGH
Non-HDL Cholesterol (Calc): 198 mg/dL (calc) — ABNORMAL HIGH (ref <130)
Total CHOL/HDL Ratio: 3.8 (calc) (ref <5.0)
Triglycerides: 155 mg/dL — ABNORMAL HIGH (ref <150)

## 2020-10-02 LAB — TSH: TSH: 2.57 mIU/L (ref 0.40–4.50)

## 2020-10-02 MED ORDER — AZITHROMYCIN 250 MG PO TABS: ORAL_TABLET | ORAL | 1 refills | Status: DC

## 2020-10-02 NOTE — Patient Instructions (Signed)
Use Mucinex twice a day for 7 days Z-pak as directed Otitis Media, Adult Otitis media is a condition in which the middle ear is red and swollen (inflamed) and full of fluid. The middle ear is the part of the ear that contains bones for hearing as well as air that helps send sounds to the brain. The condition usually goes away on its own. What are the causes? This condition is caused by a blockage in the eustachian tube. This tube connects the middle ear to the back of the nose. It normally allows air into the middle ear. The blockage is caused by fluid or swelling. Problems that can cause blockage include: A cold or infection that affects the nose, mouth, or throat. Allergies. An irritant, such as tobacco smoke. Adenoids that have become large. The adenoids are soft tissue located in the back of the throat, behind the nose and the roof of the mouth. Growth or swelling in the upper part of the throat, just behind the nose (nasopharynx). Damage to the ear caused by a change in pressure. This is called barotrauma. What increases the risk? You are more likely to develop this condition if you: Smoke or are exposed to tobacco smoke. Have an opening in the roof of your mouth (cleft palate). Have acid reflux. Have problems in your body's defense system (immune system). What are the signs or symptoms? Symptoms of this condition include: Ear pain. Fever. Problems with hearing. Being tired. Fluid leaking from the ear. Ringing in the ear. How is this treated? This condition can go away on its own within 3-5 days. But if the condition is caused by germs (bacteria) and does not go away on its own, or if it keeps coming back, your doctor may: Give you antibiotic medicines. Give you medicines for pain. Follow these instructions at home: Take over-the-counter and prescription medicines only as told by your doctor. If you were prescribed an antibiotic medicine, take it as told by your doctor. Do not  stop taking it even if you start to feel better. Keep all follow-up visits. Contact a doctor if: You have bleeding from your nose. There is a lump on your neck. You are not feeling better in 5 days. You feel worse instead of better. Get help right away if: You have pain that is not helped with medicine. You have swelling, redness, or pain around your ear. You get a stiff neck. You cannot move part of your face (paralysis). You notice that the bone behind your ear hurts when you touch it. You get a very bad headache. Summary Otitis media means that the middle ear is red, swollen, and full of fluid. This condition usually goes away on its own. If the problem does not go away, treatment may be needed. You may be given medicines to treat the infection or to treat your pain. If you were prescribed an antibiotic medicine, take it as told by your doctor. Do not stop taking it even if you start to feel better. Keep all follow-up visits. This information is not intended to replace advice given to you by your health care provider. Make sure you discuss any questions you have with your health care provider. Document Revised: 04/21/2020 Document Reviewed: 04/21/2020 Elsevier Patient Education  [place].

## 2020-10-02 NOTE — Progress Notes (Signed)
Assessment and Plan:  Stephanie Roach was seen today for otalgia.  Diagnoses and all orders for this visit:  Labile hypertension -     CBC with Differential/Platelet  Hyperlipidemia, mixed -     COMPLETE METABOLIC PANEL WITH GFR -     Lipid panel -     TSH  Chronic pain of both knees  Continue to monitor and is considering following up with her orthopedic doctor for possible cortisone injections  Difficulty ambulating and has been limiting her ability going out because having difficulty getting from car to store.  She cannot walk more than 200 feet without needing to rest, needs assistance to help with ambulation   Chronic anxiety  Continue to follow up with psychiatry and consider therapist  Current mild episode of major depressive disorder, unspecified whether recurrent ([place])  Started on Wellbutrin yesterday in addition to Effexor.  Monitor symptoms, continue to follow with psychiatrist  Fatigue due to depression -     TSH  Non-recurrent acute serous otitis media of left ear -     azithromycin (ZITHROMAX) 250 MG tablet; Take 2 tablets (500 mg) on  Day 1,  followed by 1 tablet (250 mg) once daily on Days 2 through 5. - Mucinex DM twice a day as needed      Further disposition pending results of labs. Discussed med's effects and SE's.   Over 30 minutes of exam, counseling, chart review, and critical decision making was performed.   Future Appointments  Date Time Provider [place]  10/27/2020  1:10 PM [facility] 2 [facility]  12/31/2020 10:00 AM [place], Caryl Pina, NP [facility] None    ------------------------------------------------------------------------------------------------------------------   HPI BP (!) 148/72   Pulse 79   Temp 97.7 F (36.5 C)   Wt 148 lb 9.6 oz (67.4 kg)   SpO2 97%   BMI 24.73 kg/m  85 y.o.female presents for ear pain and congestion  Pt has been having  congestion in sinus with related pain in sinuses and left ear pain. No  fever/chills, headaches, myalgias, sore throat.    Pt is still having increased depression , fatigue, feels down and hopeless at times.  She is followed by a psychiatrist.  He ordered Wellbutrin to add to her Effexor yesterday. Denies suicidal/homicidal ideation  Difficulty getting in and out of car due to knee pain.  Unable to walk more than 50 feet without severe pain.  Has seen orthopedics in the past and had cortisone shots.  Lipoma on back has not increased in size and is not painful but pt is very concerned.  Pt also concerned about breast lump and would like breast exam.  Had right breast lumpectomy 07/05/19 which was benign    Past Medical History:  Diagnosis Date   Allergic rhinitis    Anemia    Anxiety    Arthritis    Depression    GERD (gastroesophageal reflux disease)    Hyperlipidemia    Hypertension    Migraine    Prediabetes    Sleep apnea    no cpap   Vitamin D deficiency      Allergies  Allergen Reactions   Morphine And Related     Unknown     Current Outpatient Medications on File Prior to Visit  Medication Sig   buPROPion (WELLBUTRIN XL) 150 MG 24 hr tablet Take 150 mg by mouth daily.   Cholecalciferol (VITAMIN D PO) Take 5,000 Units by mouth daily.    Fe Fum-FePoly-FA-Vit C-Vit B3 (  FOLIVANE-F) 125-1 MG CAPS Take 1 capsule Daily   LORazepam (ATIVAN) 1 MG tablet Take 1 mg by mouth 2 (two) times daily.   traZODone (DESYREL) 50 MG tablet Take  1 to 2 tablets  1 hour  before Bedtime as needed for Sleep   venlafaxine XR (EFFEXOR-XR) 150 MG 24 hr capsule Take 150 mg by mouth at bedtime.   No current facility-administered medications on file prior to visit.    Review of Systems  Constitutional:  Positive for malaise/fatigue. Negative for chills and fever.  HENT:  Positive for congestion, ear pain, hearing loss and sinus pain.   Eyes:  Negative for blurred vision and double vision.  Respiratory:  Negative for cough, shortness of breath and wheezing.    Cardiovascular:  Negative for chest pain, palpitations, orthopnea and leg swelling.  Gastrointestinal:  Negative for abdominal pain, constipation, diarrhea, heartburn, nausea and vomiting.  Genitourinary:  Negative for dysuria.  Musculoskeletal:  Positive for back pain and joint pain.  Neurological:  Negative for dizziness and headaches.  Endo/Heme/Allergies:  Does not bruise/bleed easily.  Psychiatric/Behavioral:  Positive for depression. Negative for suicidal ideas.     Physical Exam:  BP (!) 148/72   Pulse 79   Temp 97.7 F (36.5 C)   Wt 148 lb 9.6 oz (67.4 kg)   SpO2 97%   BMI 24.73 kg/m   General Appearance: Well nourished, in no apparent distress. Eyes: PERRLA, EOMs, conjunctiva no swelling or erythema Sinuses: No Frontal/maxillary tenderness ENT/Mouth: Ext aud canals clear, Left TM bulging serous drainage. No erythema, swellin. Some clear post pharynx drainage noted.  Tonsils not swollen or erythematous. Hearing normal.  Breast: right upper outer quadrant soft moveable, most likely fibrocystic area noted. No nipple discharge or retractions noted Neck: Supple, thyroid normal.  Respiratory: Respiratory effort normal, BS equal bilaterally without rales, rhonchi, wheezing or stridor.  Cardio: RRR with no MRGs. Brisk peripheral pulses without edema.  Abdomen: Soft, + BS.  Non tender, no guarding, rebound, hernias, masses. Lymphatics: left submandibular adenopathy Musculoskeletal: Full ROM, 5/5 strength, normal gait.  Skin: Warm, dry without rashes, lesions, ecchymosis. Lipoma on back is unchanged in size and nontender Neuro: Cranial nerves intact. Normal muscle tone, no cerebellar symptoms. Sensation intact.  Psych: Awake and oriented X 3, normal affect, Insight and Judgment appropriate.     Magda Bernheim, NP 11:22 AM [facility]

## 2020-10-27 ENCOUNTER — Ambulatory Visit
Admission: RE | Admit: 2020-10-27 | Discharge: 2020-10-27 | Disposition: A | Discharge status: Home / Self Care | Payer: Medicare Other | Source: Ambulatory Visit | Attending: Internal Medicine | Admitting: Internal Medicine

## 2020-10-27 ENCOUNTER — Other Ambulatory Visit: Payer: Self-pay

## 2020-10-27 DIAGNOSIS — Z1231 Encounter for screening mammogram for malignant neoplasm of breast: Secondary | ICD-10-CM

## 2020-12-29 ENCOUNTER — Encounter: Payer: Medicare Other | Admitting: Adult Health Nurse Practitioner

## 2020-12-30 NOTE — Progress Notes (Signed)
CPE AND 3 MONTH FOLLOW  Assessment:   Stephanie Roach was seen today for annual exam.  Diagnoses and all orders for this visit:  Encounter for general adult medical examination with abnormal findings Due Annually  Current mild episode of major depressive disorder, unspecified whether recurrent (HCC) Continue Effexor and Wellbutrin, Behavior modifications of diet, exercise and Good sleep hygiene  Labile hypertension Currently controlled without medication -     CBC with Differential/Platelet -     EKG 12-Lead -     Urinalysis, Routine w reflex microscopic -     Microalbumin / creatinine urine ratio  Hyperlipidemia, mixed Continue diet and exercise Discussed possible need for statin if remains elevated -     COMPLETE METABOLIC PANEL WITH GFR -     Lipid panel -     TSH -     EKG 12-Lead  Chronic pain of both knees Continue exercise, follow up with ortho as needed  Chronic anxiety Continue Effexor and encouraged to restart Wellbutrin Monitor symptoms, encourage behavior modifications exercise and good sleep hygiene  Abnormal glucose -     Hemoglobin A1c Continue diet and exercise  Vitamin D deficiency -     VITAMIN D 25 Hydroxy (Vit-D Deficiency, Fractures)  Medication management -     Magnesium  Iron deficiency anemia, unspecified iron deficiency anemia type -     CBC with Differential/Platelet -     Iron, Total/Total Iron Binding Cap  Osteoporosis, unspecified osteoporosis type, unspecified pathological fracture presence Continue Calcium and Vit D3 will order DEXA with next years mammogram  Hx of adenomatous colonic polyps Monitor for symptoms, has aged out of colonoscopy  Screening for hematuria or proteinuria -     Urinalysis, Routine w reflex microscopic -     Microalbumin / creatinine urine ratio  Screening for ischemic heart disease -     EKG 12-Lead  Sensation of fullness in both ears Mucinex as needed, suggest allergy med such as Zyrtec or Allegra as  well  Flu vaccine need High dose flu vaccine given      Future Appointments  Date Time Provider [place]  01/01/2022 10:00 AM Magda Bernheim, NP [facility] None      Subjective:   Stephanie Roach is a 85 y.o. female who presents for CPE and follow up for chol, preDM, and HTN.   Stephanie Roach feels like her ears are stopped up , no pain, denies fever, sinus congestion.    Her blood pressure has been controlled at home, today their BP is BP: 126/82 Stephanie Roach does not workout.  Stephanie Roach denies chest pain,dizziness.   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 Stephanie Roach is on integra and following with Dr. Julien Nordmann. Lab Results  Component Value Date   IRON 49 09/19/2019   TIBC 461 (H) 09/19/2019   FERRITIN 8 (L) 09/19/2019   Stephanie Roach diagnosed with parkinson's and he is declining very quickly. Stephanie Roach states Stephanie Roach worries a lot about him.. Stephanie Roach is on effexor 150mg  a day which helps. Was reluctant to take the buproprion, still grieving over husband death.  Strongly encouraged to start the Wellbutrin.  Stephanie Roach is not on cholesterol medication and denies myalgias. Her cholesterol is not at goal. The cholesterol last visit was:  Reluctant to try medications, wants to try diet and exercise Lab Results  Component Value Date   CHOL 269 (H) 10/02/2020   HDL 71 10/02/2020  LDLCALC 168 (H) 10/02/2020   TRIG 155 (H) 10/02/2020   CHOLHDL 3.8 10/02/2020    Stephanie Roach has been working on diet and exercise for prediabetes, and denies polydipsia, polyuria and visual disturbances. Last A1C in the office was:  Lab Results  Component Value Date   HGBA1C 6.3 (H) 05/15/2020   Patient is on Vitamin D supplement, 2000 IU.   Lab Results  Component Value Date   VD25OH 71 05/15/2020     Saw Dr. Berenice Primas for bilateral knee pain, got injections that helped. Has not had an injection for awhile, pain persists continually.  BMI is Body mass index is 24.76  kg/m., Stephanie Roach is working on diet and exercise. Wt Readings from Last 3 Encounters:  01/01/21 148 lb 12.8 oz (67.5 kg)  10/02/20 148 lb 9.6 oz (67.4 kg)  08/05/20 147 lb (66.7 kg)     Names of Other Physician/Practitioners you currently use: 1. [facility]- here for primary care 2. Dr. Katy Fitch, eye doctor, last visit 3 months 3. Dr. Osa Craver, dentist, last visit 6 months Patient Care Team: Unk Pinto, MD as PCP - General (Internal Medicine) Lafayette Dragon, MD (Inactive) as Consulting Physician (Gastroenterology) Norma Fredrickson, MD as Consulting Physician (Psychiatry) Curt Bears, MD as Consulting Physician (Oncology) Berenice Primas, MD as Referring Physician (Orthopedic Surgery) Daryll Brod, MD as Consulting Physician (Orthopedic Surgery)   Medication Review Current Outpatient Medications on File Prior to Visit  Medication Sig Dispense Refill   buPROPion (WELLBUTRIN XL) 150 MG 24 hr tablet Take 150 mg by mouth daily.     Cholecalciferol (VITAMIN D PO) Take 5,000 Units by mouth daily.      Fe Fum-FePoly-FA-Vit C-Vit B3 (FOLIVANE-F) 125-1 MG CAPS Take 1 capsule Daily 90 capsule 1   LORazepam (ATIVAN) 1 MG tablet Take 1 mg by mouth 2 (two) times daily.     traZODone (DESYREL) 50 MG tablet Take  1 to 2 tablets  1 hour  before Bedtime as needed for Sleep 60 tablet 0   venlafaxine XR (EFFEXOR-XR) 150 MG 24 hr capsule Take 150 mg by mouth at bedtime.     azithromycin (ZITHROMAX) 250 MG tablet Take 2 tablets (500 mg) on  Day 1,  followed by 1 tablet (250 mg) once daily on Days 2 through 5. (Patient not taking: Reported on 01/01/2021) 6 each 1   No current facility-administered medications on file prior to visit.    Current Problems (verified) Patient Active Problem List   Diagnosis Date Noted   Grief reaction 04/02/2020   S/P lumpectomy, right breast 09/17/2019   Medication management 02/18/2015   Hiatal hernia 02/18/2015   Abnormal  glucose    Hyperlipidemia, mixed    Labile hypertension    Vitamin D deficiency    Hx of adenomatous colonic polyps 08/11/2010   Iron deficiency anemia 08/11/2010   Chronic anxiety 08/11/2010    Screening Tests Immunization History  Administered Date(s) Administered   Influenza, High Dose Seasonal PF 12/29/2018   PFIZER(Purple Top)SARS-COV-2 Vaccination 05/07/2019, 05/30/2019   Pneumococcal Conjugate-13 08/27/2013   Pneumococcal Polysaccharide-23 10/23/2015   Pneumococcal-Unspecified 12/06/2001   Tdap 08/05/2012   Zoster, Live 02/05/2010    Preventative care: Last colonoscopy: 2015 EGD 2015 Last mammogram:10/27/20 negative repeat 1 year Last pap smear/pelvic exam: 2012  DEXA: 09/2015 + osteoporosis off the fosamax- could not tolerate due to her stomach. Will schedule with mammogram next year  Prior vaccinations: TD or Tdap: 2014  Influenza: 01/01/21 Prevnar 13: 2015 Pneumococcal:  2017 Shingles/Zostavax: 2012  Allergies Allergies  Allergen Reactions   Morphine And Related     Unknown     SURGICAL HISTORY Stephanie Roach  has a past surgical history that includes Cholecystectomy (2003); Knee arthroscopy; Bunionectomy; Wrist fracture surgery; Cataract extraction (Bilateral); Breast lumpectomy with radioactive seed localization (Right, 07/05/2019); Breast biopsy (Right, 10/19/2018); and Breast excisional biopsy (Right, 07/05/2019). FAMILY HISTORY Her family history includes Breast cancer in her cousin; Colon cancer in her father; Colon polyps in her maternal aunt; Pancreatic cancer in her paternal aunt; Stroke in her mother; Ulcerative colitis in her brother. SOCIAL HISTORY Stephanie Roach  reports that Stephanie Roach has never smoked. Stephanie Roach has never used smokeless tobacco. Stephanie Roach reports that Stephanie Roach does not drink alcohol and does not use drugs.  Review of Systems  Constitutional:  Positive for malaise/fatigue. Negative for chills, diaphoresis, fever and weight loss.  HENT: Negative.  Negative for congestion,  hearing loss, sinus pain, sore throat and tinnitus.   Eyes: Negative.  Negative for blurred vision and double vision.  Respiratory: Negative.  Negative for cough, hemoptysis, sputum production, shortness of breath and wheezing.   Cardiovascular: Negative.  Negative for chest pain, palpitations and leg swelling.  Gastrointestinal:  Positive for heartburn (Tums). Negative for abdominal pain, constipation, diarrhea, nausea and vomiting.  Genitourinary: Negative.  Negative for dysuria and urgency.  Musculoskeletal:  Positive for joint pain (knees). Negative for back pain, falls, myalgias and neck pain.  Skin: Negative.  Negative for rash.  Neurological: Negative.  Negative for dizziness, tingling, tremors, weakness and headaches.  Endo/Heme/Allergies:  Does not bruise/bleed easily.  Psychiatric/Behavioral:  Positive for depression. Negative for suicidal ideas. The patient has insomnia (bad dreams). The patient is not nervous/anxious.     Objective:     Blood pressure 126/82, pulse 87, temperature (!) 97.3 F (36.3 C), height 5\' 5"  (1.651 m), weight 148 lb 12.8 oz (67.5 kg), SpO2 97 %. Body mass index is 24.76 kg/m.  General appearance: alert, no distress, WD/WN,  female HEENT: normocephalic, sclerae anicteric, TMs pearly, nares patent, no discharge or erythema, pharynx normal Oral cavity: MMM, no lesions Neck: supple, no lymphadenopathy, no thyromegaly, no masses Heart: RRR, normal S1, S2, no murmurs Lungs: CTA bilaterally, no wheezes, rhonchi, or rales Abdomen: +bs, soft, non tender, non distended, no masses, no hepatomegaly, no splenomegaly Musculoskeletal: nontender, no swelling, no obvious deformity.  Extremities: no edema, no cyanosis, no clubbing Pulses: 2+ symmetric, upper and lower extremities, normal cap refill Neurological: alert, oriented x 3, CN2-12 intact, strength normal upper extremities and lower extremities, sensation normal throughout, DTRs 2+ throughout, no cerebellar  signs, gait normal Psychiatric: normal affect, behavior normal, pleasant  Breast:   breasts appear normal, no suspicious masses, no skin or nipple changes or axillary nodes. Gyn: defer  Rectal: defer EKG: NSR, no ST changes  Magda Bernheim, NP   01/01/2021

## 2020-12-31 ENCOUNTER — Encounter: Payer: Medicare Other | Admitting: Adult Health

## 2021-01-01 ENCOUNTER — Other Ambulatory Visit: Payer: Self-pay

## 2021-01-01 ENCOUNTER — Encounter: Payer: Self-pay | Admitting: Nurse Practitioner

## 2021-01-01 ENCOUNTER — Ambulatory Visit (INDEPENDENT_AMBULATORY_CARE_PROVIDER_SITE_OTHER): Payer: Medicare Other | Admitting: Nurse Practitioner

## 2021-01-01 VITALS — BP 126/82 | HR 87 | Temp 97.3°F | Ht 65.0 in | Wt 148.8 lb

## 2021-01-01 DIAGNOSIS — F419 Anxiety disorder, unspecified: Secondary | ICD-10-CM

## 2021-01-01 DIAGNOSIS — R0989 Other specified symptoms and signs involving the circulatory and respiratory systems: Secondary | ICD-10-CM

## 2021-01-01 DIAGNOSIS — H938X3 Other specified disorders of ear, bilateral: Secondary | ICD-10-CM

## 2021-01-01 DIAGNOSIS — Z23 Encounter for immunization: Secondary | ICD-10-CM | POA: Diagnosis not present

## 2021-01-01 DIAGNOSIS — Z79899 Other long term (current) drug therapy: Secondary | ICD-10-CM

## 2021-01-01 DIAGNOSIS — E782 Mixed hyperlipidemia: Secondary | ICD-10-CM | POA: Diagnosis not present

## 2021-01-01 DIAGNOSIS — I10 Essential (primary) hypertension: Secondary | ICD-10-CM

## 2021-01-01 DIAGNOSIS — Z136 Encounter for screening for cardiovascular disorders: Secondary | ICD-10-CM

## 2021-01-01 DIAGNOSIS — Z0000 Encounter for general adult medical examination without abnormal findings: Secondary | ICD-10-CM | POA: Diagnosis not present

## 2021-01-01 DIAGNOSIS — E559 Vitamin D deficiency, unspecified: Secondary | ICD-10-CM

## 2021-01-01 DIAGNOSIS — M810 Age-related osteoporosis without current pathological fracture: Secondary | ICD-10-CM

## 2021-01-01 DIAGNOSIS — R7309 Other abnormal glucose: Secondary | ICD-10-CM

## 2021-01-01 DIAGNOSIS — D509 Iron deficiency anemia, unspecified: Secondary | ICD-10-CM | POA: Diagnosis not present

## 2021-01-01 DIAGNOSIS — F320 Major depressive disorder, single episode, mild: Secondary | ICD-10-CM

## 2021-01-01 DIAGNOSIS — G8929 Other chronic pain: Secondary | ICD-10-CM

## 2021-01-01 DIAGNOSIS — Z0001 Encounter for general adult medical examination with abnormal findings: Secondary | ICD-10-CM

## 2021-01-01 DIAGNOSIS — M25562 Pain in left knee: Secondary | ICD-10-CM

## 2021-01-01 DIAGNOSIS — Z86010 Personal history of colonic polyps: Secondary | ICD-10-CM

## 2021-01-01 DIAGNOSIS — Z1389 Encounter for screening for other disorder: Secondary | ICD-10-CM

## 2021-01-01 NOTE — Patient Instructions (Signed)

## 2021-01-03 LAB — CBC WITH DIFFERENTIAL/PLATELET
Absolute Monocytes: 410 cells/uL (ref 200–950)
Basophils Absolute: 29 cells/uL (ref 0–200)
Basophils Relative: 0.5 %
Eosinophils Absolute: 120 cells/uL (ref 15–500)
Eosinophils Relative: 2.1 %
HCT: 36.4 % (ref 35.0–45.0)
Hemoglobin: 11.9 g/dL (ref 11.7–15.5)
Lymphs Abs: 1277 cells/uL (ref 850–3900)
MCH: 26.3 pg — ABNORMAL LOW (ref 27.0–33.0)
MCHC: 32.7 g/dL (ref 32.0–36.0)
MCV: 80.5 fL (ref 80.0–100.0)
MPV: 10.5 fL (ref 7.5–12.5)
Monocytes Relative: 7.2 %
Neutro Abs: 3865 cells/uL (ref 1500–7800)
Neutrophils Relative %: 67.8 %
Platelets: 369 10*3/uL (ref 140–400)
RBC: 4.52 10*6/uL (ref 3.80–5.10)
RDW: 13.3 % (ref 11.0–15.0)
Total Lymphocyte: 22.4 %
WBC: 5.7 10*3/uL (ref 3.8–10.8)

## 2021-01-03 LAB — URINALYSIS, ROUTINE W REFLEX MICROSCOPIC
Bacteria, UA: NONE SEEN /HPF
Bilirubin Urine: NEGATIVE
Glucose, UA: NEGATIVE
Hgb urine dipstick: NEGATIVE
Hyaline Cast: NONE SEEN /LPF
Ketones, ur: NEGATIVE
Leukocytes,Ua: NEGATIVE
Nitrite: NEGATIVE
RBC / HPF: NONE SEEN /HPF (ref 0–2)
Specific Gravity, Urine: 1.018 (ref 1.001–1.035)
pH: 5.5 (ref 5.0–8.0)

## 2021-01-03 LAB — COMPLETE METABOLIC PANEL WITH GFR
AG Ratio: 1.7 (calc) (ref 1.0–2.5)
ALT: 21 U/L (ref 6–29)
AST: 18 U/L (ref 10–35)
Albumin: 4.1 g/dL (ref 3.6–5.1)
Alkaline phosphatase (APISO): 104 U/L (ref 37–153)
BUN: 11 mg/dL (ref 7–25)
CO2: 26 mmol/L (ref 20–32)
Calcium: 9.4 mg/dL (ref 8.6–10.4)
Chloride: 104 mmol/L (ref 98–110)
Creat: 0.66 mg/dL (ref 0.60–0.95)
Globulin: 2.4 g/dL (calc) (ref 1.9–3.7)
Glucose, Bld: 130 mg/dL — ABNORMAL HIGH (ref 65–99)
Potassium: 3.6 mmol/L (ref 3.5–5.3)
Sodium: 140 mmol/L (ref 135–146)
Total Bilirubin: 0.3 mg/dL (ref 0.2–1.2)
Total Protein: 6.5 g/dL (ref 6.1–8.1)
eGFR: 87 mL/min/{1.73_m2} (ref >=60)

## 2021-01-03 LAB — VITAMIN D 25 HYDROXY (VIT D DEFICIENCY, FRACTURES): Vit D, 25-Hydroxy: 54 ng/mL (ref 30–100)

## 2021-01-03 LAB — IRON, TOTAL/TOTAL IRON BINDING CAP
%SAT: 10 % (calc) — ABNORMAL LOW (ref 16–45)
Iron: 44 ug/dL — ABNORMAL LOW (ref 45–160)
TIBC: 436 mcg/dL (calc) (ref 250–450)

## 2021-01-03 LAB — LIPID PANEL
Cholesterol: 196 mg/dL (ref <200)
HDL: 61 mg/dL (ref >=50)
LDL Cholesterol (Calc): 106 mg/dL (calc) — ABNORMAL HIGH
Non-HDL Cholesterol (Calc): 135 mg/dL (calc) — ABNORMAL HIGH (ref <130)
Total CHOL/HDL Ratio: 3.2 (calc) (ref <5.0)
Triglycerides: 176 mg/dL — ABNORMAL HIGH (ref <150)

## 2021-01-03 LAB — HEMOGLOBIN A1C
Hgb A1c MFr Bld: 6.7 % of total Hgb — ABNORMAL HIGH (ref <5.7)
Mean Plasma Glucose: 146 mg/dL
eAG (mmol/L): 8.1 mmol/L

## 2021-01-03 LAB — TSH: TSH: 1.82 mIU/L (ref 0.40–4.50)

## 2021-01-03 LAB — MAGNESIUM: Magnesium: 1.8 mg/dL (ref 1.5–2.5)

## 2021-01-03 LAB — MICROSCOPIC MESSAGE

## 2021-01-03 LAB — MICROALBUMIN / CREATININE URINE RATIO
Creatinine, Urine: 128 mg/dL (ref 20–275)
Microalb Creat Ratio: 7 mcg/mg creat (ref <30)
Microalb, Ur: 0.9 mg/dL

## 2021-03-24 DIAGNOSIS — H6122 Impacted cerumen, left ear: Secondary | ICD-10-CM | POA: Diagnosis not present

## 2021-03-24 DIAGNOSIS — K219 Gastro-esophageal reflux disease without esophagitis: Secondary | ICD-10-CM | POA: Diagnosis not present

## 2021-04-10 DIAGNOSIS — Z961 Presence of intraocular lens: Secondary | ICD-10-CM | POA: Diagnosis not present

## 2021-04-10 DIAGNOSIS — H1013 Acute atopic conjunctivitis, bilateral: Secondary | ICD-10-CM | POA: Diagnosis not present

## 2021-04-10 DIAGNOSIS — H04123 Dry eye syndrome of bilateral lacrimal glands: Secondary | ICD-10-CM | POA: Diagnosis not present

## 2021-04-14 NOTE — Progress Notes (Signed)
MEDICARE ANNUAL WELLNESS VISIT AND 3 MONTH FOLLOW ? ?Assessment:  ? ?Stephanie Roach was seen today for annual exam. ? ?Diagnoses and all orders for this visit: ? ?Encounter for medicare annual wellness visit ?Due Annually ? ?Current mild episode of major depressive disorder, unspecified whether recurrent ([place]) ?Continue Effexor and Wellbutrin, Behavior modifications of diet, exercise and Good sleep hygiene ? ?Labile hypertension ?Currently controlled without medication ?-     CBC with Differential/Platelet ?-     EKG 12-Lead ?-     Urinalysis, Routine w reflex microscopic ?-     Microalbumin / creatinine urine ratio ? ?Hyperlipidemia, mixed ?Continue diet and exercise ?Discussed possible need for statin if remains elevated ?-     COMPLETE METABOLIC PANEL WITH GFR ?-     Lipid panel ?-     TSH ?-     EKG 12-Lead ? ?Chronic pain of both knees ?Continue exercise, follow up with ortho as needed ? ?Chronic anxiety ?Continue Effexor and encouraged to restart Wellbutrin ?Monitor symptoms, encourage behavior modifications exercise and good sleep hygiene ? ?Abnormal glucose ?-     Hemoglobin A1c ?Continue diet and exercise ? ?Vitamin D deficiency ?-     VITAMIN D 25 Hydroxy (Vit-D Deficiency, Fractures) ? ?Medication management ?-     Magnesium ? ?Iron deficiency anemia, unspecified iron deficiency anemia type ?-     CBC with Differential/Platelet ?-     Iron, Total/Total Iron Binding Cap ? ?Osteoporosis, unspecified osteoporosis type, unspecified pathological fracture presence ?Continue Calcium and Vit D3 will order DEXA with next years mammogram ? ?Hx of adenomatous colonic polyps ?Monitor for symptoms, has aged out of colonoscopy ? ? ? ?  ?Future Appointments  ?Date Time Provider [place]  ?01/01/2022 10:00 AM Jakarie Pember, Townsend Roger, NP [facility] None  ? ? ? ?Plan:  ? ?During the course of the visit the patient was educated and counseled about appropriate screening and preventive services including:  ? ?Pneumococcal vaccine   ?Prevnar 13 ?Influenza vaccine ?Td vaccine ?Screening electrocardiogram ?Bone densitometry screening ?Colorectal cancer screening ?Diabetes screening ?Glaucoma screening ?Nutrition counseling  ?Advanced directives: requested  ? ?Subjective:  ? ?Stephanie Roach is a 86 y.o. female who presents for CPE and follow up for chol, preDM, and HTN.  ? ?She has noticed right ear discomfort, saw Dr. Constance Holster 03/24/21 and had removal of cerumen impaction of left ear.  ? ? Her blood pressure has been controlled at home, today their BP is BP: 120/76  ?BP Readings from Last 3 Encounters:  ?04/16/21 120/76  ?01/01/21 126/82  ?10/02/20 (!) 148/72  ? ? ?She does not workout.  She denies chest pain,dizziness.  ? 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 She is on integra and following with Dr. Julien Nordmann. ?Lab Results  ?Component Value Date  ? IRON 44 (L) 01/01/2021  ? TIBC 436 01/01/2021  ? FERRITIN 8 (L) 09/19/2019  ? ?Husband, Stephanie Roach is deceased.  Continues to have issues with depression/anxiety. She does have limited interactions with her friends. She is on Effexor 150 mg daily and Ativan 1 mg as needed.  She is also using Trazodone for sleep as needed. She tried Buproprion and stopped because she did not feel it helped. She does not want to go on another medication.  She will consider going to a support group.  ? ?She is not on cholesterol medication and denies myalgias. Her cholesterol is not at  goal. The cholesterol last visit was:  Reluctant to try medications, wants to try diet and exercise ?Lab Results  ?Component Value Date  ? CHOL 196 01/01/2021  ? HDL 61 01/01/2021  ? LDLCALC 106 (H) 01/01/2021  ? TRIG 176 (H) 01/01/2021  ? CHOLHDL 3.2 01/01/2021  ? ? She has been working on diet and exercise for prediabetes, and denies polydipsia, polyuria and visual disturbances. Last A1C in the office was:  ?Lab Results  ?Component Value Date  ? HGBA1C 6.7 (H)  01/01/2021  ? ?Patient is on Vitamin D supplement, 2000 IU.   ?Lab Results  ?Component Value Date  ? VD25OH 54 01/01/2021  ?   ?Saw Dr. Berenice Primas for bilateral knee pain, got injections that helped. Has not had an injection for awhile, pain persists continually. She is having continued knee pain and needs to have another visit for injections ?BMI is Body mass index is 25.23 kg/m?., she is working on diet and exercise. ?Wt Readings from Last 3 Encounters:  ?04/16/21 151 lb 9.6 oz (68.8 kg)  ?01/01/21 148 lb 12.8 oz (67.5 kg)  ?10/02/20 148 lb 9.6 oz (67.4 kg)  ? ?Immunization History  ?Administered Date(s) Administered  ? Influenza, High Dose Seasonal PF 12/29/2018, 01/01/2021  ? PFIZER(Purple Top)SARS-COV-2 Vaccination 05/07/2019, 05/30/2019  ? Pneumococcal Conjugate-13 08/27/2013  ? Pneumococcal Polysaccharide-23 10/23/2015  ? Pneumococcal-Unspecified 12/06/2001  ? Tdap 08/05/2012  ? Zoster, Live 02/05/2010  ? ?Health Maintenance  ?Topic Date Due  ? COVID-19 Vaccine (3 - Pfizer risk series) 05/02/2021 (Originally 06/27/2019)  ? Zoster Vaccines- Shingrix (1 of 2) 07/17/2021 (Originally 12/21/1954)  ? TETANUS/TDAP  08/06/2022  ? Pneumonia Vaccine 33+ Years old  Completed  ? INFLUENZA VACCINE  Completed  ? DEXA SCAN  Completed  ? HPV VACCINES  Aged Out  ? COLONOSCOPY (Pts 45-73yr Insurance coverage will need to be confirmed)  Discontinued  ?  ?Mammogram: 10/27/20 Negative ?Colonoscopy: 2015 , no follow up recommended ?DEXA: 2017 osteoporosis, on Vit D3 ? ?Names of Other Physician/Practitioners you currently use: ?[facility]. [facility] ?2. Dr. GKaty Fitch eye doctor, 03/2021 ?3. Dr. MOsa Craver dentist, 01/2021 ?Patient Care Team: ?MUnk Pinto MD as PCP - General (Internal Medicine) ?BLafayette Dragon MD (Inactive) as Consulting Physician (Gastroenterology) ?PNorma Fredrickson MD as Consulting Physician (Psychiatry) ?MCurt Bears MD as Consulting Physician  (Oncology) ?GBerenice Primas MD as Referring Physician (Orthopedic Surgery) ?KDaryll Brod MD as Consulting Physician (Orthopedic Surgery) ? ? ?Medication Review ?Current Outpatient Medications on File Prior to Visit  ?Medication Sig Dispense Refill  ? buPROPion (WELLBUTRIN XL) 150 MG 24 hr tablet Take 150 mg by mouth daily.    ? Cholecalciferol (VITAMIN D PO) Take 5,000 Units by mouth daily.     ? Fe Fum-FePoly-FA-Vit C-Vit B3 (FOLIVANE-F) 125-1 MG CAPS Take 1 capsule Daily 90 capsule 1  ? LORazepam (ATIVAN) 1 MG tablet Take 1 mg by mouth 2 (two) times daily.    ? traZODone (DESYREL) 50 MG tablet Take  1 to 2 tablets  1 hour  before Bedtime as needed for Sleep 60 tablet 0  ? venlafaxine XR (EFFEXOR-XR) 150 MG 24 hr capsule Take 150 mg by mouth at bedtime.    ? ?No current facility-administered medications on file prior to visit.  ? ? ?Current Problems (verified) ?Patient Active Problem List  ? Diagnosis Date Noted  ? Grief reaction 04/02/2020  ? S/P lumpectomy, right breast 09/17/2019  ? Medication management 02/18/2015  ? Hiatal hernia  02/18/2015  ? Abnormal glucose   ? Hyperlipidemia, mixed   ? Labile hypertension   ? Vitamin D deficiency   ? Hx of adenomatous colonic polyps 08/11/2010  ? Iron deficiency anemia 08/11/2010  ? Chronic anxiety 08/11/2010  ? ? ?Screening Tests ?Immunization History  ?Administered Date(s) Administered  ? Influenza, High Dose Seasonal PF 12/29/2018, 01/01/2021  ? PFIZER(Purple Top)SARS-COV-2 Vaccination 05/07/2019, 05/30/2019  ? Pneumococcal Conjugate-13 08/27/2013  ? Pneumococcal Polysaccharide-23 10/23/2015  ? Pneumococcal-Unspecified 12/06/2001  ? Tdap 08/05/2012  ? Zoster, Live 02/05/2010  ? ? ?Preventative care: ?Last colonoscopy: 2015 ?EGD 2015 ?Last mammogram:10/27/20 negative repeat 1 year ?Last pap smear/pelvic exam: 2012  ?DEXA: 09/2015 + osteoporosis off the fosamax- could not tolerate due to her stomach. Will schedule with mammogram next year ? ?Prior vaccinations: ?TD or  Tdap: 2014  ?Influenza: 01/01/21 ?Prevnar 13: 2015 ?Pneumococcal: 2017 ?Shingles/Zostavax: 2012 ? ?Allergies ?Allergies  ?Allergen Reactions  ? Morphine And Related   ?  Unknown ?  ? ? ?SURGICAL HISTORY ?She  has a past surgi

## 2021-04-16 ENCOUNTER — Other Ambulatory Visit: Payer: Self-pay

## 2021-04-16 ENCOUNTER — Encounter: Payer: Self-pay | Admitting: Nurse Practitioner

## 2021-04-16 ENCOUNTER — Ambulatory Visit (INDEPENDENT_AMBULATORY_CARE_PROVIDER_SITE_OTHER): Payer: Medicare Other | Admitting: Nurse Practitioner

## 2021-04-16 VITALS — BP 120/76 | HR 83 | Temp 97.5°F | Wt 151.6 lb

## 2021-04-16 DIAGNOSIS — D509 Iron deficiency anemia, unspecified: Secondary | ICD-10-CM | POA: Diagnosis not present

## 2021-04-16 DIAGNOSIS — Z79899 Other long term (current) drug therapy: Secondary | ICD-10-CM | POA: Diagnosis not present

## 2021-04-16 DIAGNOSIS — E782 Mixed hyperlipidemia: Secondary | ICD-10-CM | POA: Diagnosis not present

## 2021-04-16 DIAGNOSIS — M25562 Pain in left knee: Secondary | ICD-10-CM | POA: Diagnosis not present

## 2021-04-16 DIAGNOSIS — Z0001 Encounter for general adult medical examination with abnormal findings: Secondary | ICD-10-CM

## 2021-04-16 DIAGNOSIS — R7309 Other abnormal glucose: Secondary | ICD-10-CM

## 2021-04-16 DIAGNOSIS — F320 Major depressive disorder, single episode, mild: Secondary | ICD-10-CM

## 2021-04-16 DIAGNOSIS — R6889 Other general symptoms and signs: Secondary | ICD-10-CM | POA: Diagnosis not present

## 2021-04-16 DIAGNOSIS — Z86010 Personal history of colonic polyps: Secondary | ICD-10-CM | POA: Diagnosis not present

## 2021-04-16 DIAGNOSIS — G8929 Other chronic pain: Secondary | ICD-10-CM

## 2021-04-16 DIAGNOSIS — E559 Vitamin D deficiency, unspecified: Secondary | ICD-10-CM

## 2021-04-16 DIAGNOSIS — Z0000 Encounter for general adult medical examination without abnormal findings: Secondary | ICD-10-CM

## 2021-04-16 DIAGNOSIS — M25561 Pain in right knee: Secondary | ICD-10-CM

## 2021-04-16 DIAGNOSIS — R0989 Other specified symptoms and signs involving the circulatory and respiratory systems: Secondary | ICD-10-CM

## 2021-04-16 DIAGNOSIS — M810 Age-related osteoporosis without current pathological fracture: Secondary | ICD-10-CM | POA: Diagnosis not present

## 2021-04-16 DIAGNOSIS — F419 Anxiety disorder, unspecified: Secondary | ICD-10-CM

## 2021-04-16 NOTE — Patient Instructions (Signed)
Dr. Raymondo Band ?[facility] ?[phone] ? ?[address] ?[place] [address] ? ?Prolonged Grief ?Grief is a normal response to the death of someone close to you. Feelings of fear, anger, and guilt can affect almost everyone who loses a loved one. It is also common to have symptoms of depression while you are grieving. These include problems with sleep, loss of appetite, and lack of energy. They may last for weeks or months after a loss. ?Prolonged grief is different from normal grief or depression. Normal grieving involves sadness and feelings of loss, but those feelings get better and heal over time. Prolonged grief is a severe type of grief that lasts for a long time, usually for several months to a year or longer. It interferes with your ability to function normally. Prolonged grief may require treatment from a mental health care provider. ?What are the causes? ?The cause of this condition is not known. It is not clear why some people continue to struggle with grief and others do not. ?What increases the risk? ?You are more likely to develop this condition if: ?The death of your loved one was sudden or unexpected. ?The death of your loved one was due to a violent event. ?Your loved one died from suicide. ?Your loved one was a child or a young person. ?You were very close to your loved one, or you were dependent on him or her. ?You have a history of depression or anxiety. ?You have very little to no support from others. ?What are the signs or symptoms? ?Symptoms of this condition include: ?Feeling disbelief or having a lack of emotion (numbness). ?Being unable to enjoy good memories of your loved one. ?Needing to avoid anything or anyone that reminds you of your loved one. ?Being unable to stop thinking about the death. ?Feeling intense anger, loneliness, helplessness, or guilt. ?Feeling that your life is meaningless and empty, and having trouble moving on with your life. ?How is this  diagnosed? ?This condition may be diagnosed based on: ?Your symptoms. Prolonged grief will be diagnosed if you have ongoing symptoms of grief for 6 months for children and 12 months or longer for adults. ?The effect of symptoms on your life. You may be diagnosed with this condition if your symptoms are interfering with your ability to live your life. ?Your health care provider may recommend that you see a mental health care provider. Many symptoms of depression are similar to the symptoms of prolonged grief. It is important to be evaluated for prolonged grief along with other mental health conditions. ?How is this treated? ?This condition is most commonly treated with talk therapy. This therapy is offered by a mental health specialist (psychiatrist). During therapy: ?You will learn healthy ways to cope with the loss of your loved one. ?Your mental health care provider may recommend antidepressant medicines. ?Follow these instructions at home: ?Lifestyle ? ?Take care of yourself. ?Eat on a regular basis, and maintain a healthy diet. Eat plenty of fruits, vegetables, lean protein, and whole grains. ?Try to get some exercise each day. Aim for 30 minutes of exercise on most days of the week. ?Keep a consistent sleep schedule. Try to get 8 or more hours of sleep each night. ?Start doing the things that you used to enjoy. ?Do not use drugs or alcohol to ease your symptoms. ?Spend time with friends and loved ones. ?General instructions ?Take over-the-counter and prescription medicines only as told by your health care provider. ?Consider joining a grief (bereavement) support group  to help you deal with your loss. ?Keep all follow-up visits. This is important. ?Contact a health care provider if: ?Your symptoms prevent you from functioning normally. ?Your symptoms do not get better with treatment. ?Get help right away if: ?You have serious thoughts about hurting yourself or someone else. ?You have suicidal feelings. ?Get help  right away if you feel like you may hurt yourself or others, or have thoughts about taking your own life. Go to your nearest emergency room or: ?Call 911. ?Call the [facility] at [phone] or 988. This is open 24 hours a day. ?Text the Crisis Text Line at 601-846-6506. ?Summary ?Prolonged grief is a severe type of grief that lasts for a long time. This grief is not likely to go away on its own. Get the help you need. ?Some griefs are more difficult than others and can cause this condition. You may need a certain type of treatment to help you recover if the loss of your loved one was sudden, violent, or due to suicide. ?You may feel guilty about moving on with your life. Getting help does not mean that you are forgetting your loved one. It means that you are taking care of yourself. ?Prolonged grief is best treated with talk therapy. Medicines may also be prescribed. ?Seek the help you need, and find support that will help you recover. ?This information is not intended to replace advice given to you by your health care provider. Make sure you discuss any questions you have with your health care provider. ?Document Revised: 09/01/2020 Document Reviewed: 09/01/2020 ?Elsevier Patient Education ? [place]. ? ?

## 2021-04-17 LAB — COMPLETE METABOLIC PANEL WITH GFR
AG Ratio: 2 (calc) (ref 1.0–2.5)
ALT: 12 U/L (ref 6–29)
AST: 15 U/L (ref 10–35)
Albumin: 4.5 g/dL (ref 3.6–5.1)
Alkaline phosphatase (APISO): 93 U/L (ref 37–153)
BUN: 8 mg/dL (ref 7–25)
CO2: 27 mmol/L (ref 20–32)
Calcium: 9 mg/dL (ref 8.6–10.4)
Chloride: 104 mmol/L (ref 98–110)
Creat: 0.62 mg/dL (ref 0.60–0.95)
Globulin: 2.2 g/dL (calc) (ref 1.9–3.7)
Glucose, Bld: 152 mg/dL — ABNORMAL HIGH (ref 65–99)
Potassium: 3.9 mmol/L (ref 3.5–5.3)
Sodium: 140 mmol/L (ref 135–146)
Total Bilirubin: 0.3 mg/dL (ref 0.2–1.2)
Total Protein: 6.7 g/dL (ref 6.1–8.1)
eGFR: 88 mL/min/{1.73_m2} (ref >=60)

## 2021-04-17 LAB — CBC WITH DIFFERENTIAL/PLATELET
Absolute Monocytes: 392 cells/uL (ref 200–950)
Basophils Absolute: 32 cells/uL (ref 0–200)
Basophils Relative: 0.6 %
Eosinophils Absolute: 0 cells/uL — ABNORMAL LOW (ref 15–500)
Eosinophils Relative: 0 %
HCT: 35.8 % (ref 35.0–45.0)
Hemoglobin: 11.2 g/dL — ABNORMAL LOW (ref 11.7–15.5)
Lymphs Abs: 954 cells/uL (ref 850–3900)
MCH: 24.6 pg — ABNORMAL LOW (ref 27.0–33.0)
MCHC: 31.3 g/dL — ABNORMAL LOW (ref 32.0–36.0)
MCV: 78.7 fL — ABNORMAL LOW (ref 80.0–100.0)
MPV: 11 fL (ref 7.5–12.5)
Monocytes Relative: 7.4 %
Neutro Abs: 3922 cells/uL (ref 1500–7800)
Neutrophils Relative %: 74 %
Platelets: 326 10*3/uL (ref 140–400)
RBC: 4.55 10*6/uL (ref 3.80–5.10)
RDW: 14.9 % (ref 11.0–15.0)
Total Lymphocyte: 18 %
WBC: 5.3 10*3/uL (ref 3.8–10.8)

## 2021-04-17 LAB — LIPID PANEL
Cholesterol: 241 mg/dL — ABNORMAL HIGH (ref <200)
HDL: 66 mg/dL (ref >=50)
LDL Cholesterol (Calc): 139 mg/dL (calc) — ABNORMAL HIGH
Non-HDL Cholesterol (Calc): 175 mg/dL (calc) — ABNORMAL HIGH (ref <130)
Total CHOL/HDL Ratio: 3.7 (calc) (ref <5.0)
Triglycerides: 216 mg/dL — ABNORMAL HIGH (ref <150)

## 2021-04-17 LAB — INSULIN, RANDOM: Insulin: 6.2 u[IU]/mL

## 2021-04-20 ENCOUNTER — Telehealth: Payer: Self-pay | Admitting: Nurse Practitioner

## 2021-04-20 NOTE — Telephone Encounter (Signed)
Per Hinton Dyer Mull,NP left vm to advise mailing list of local grief support groups.  ?

## 2021-07-19 ENCOUNTER — Encounter: Payer: Self-pay | Admitting: Internal Medicine

## 2021-07-20 ENCOUNTER — Encounter: Payer: Self-pay | Admitting: Internal Medicine

## 2021-07-20 ENCOUNTER — Ambulatory Visit (INDEPENDENT_AMBULATORY_CARE_PROVIDER_SITE_OTHER): Payer: Medicare Other | Admitting: Internal Medicine

## 2021-07-20 VITALS — BP 108/64 | HR 98 | Temp 97.7°F | Ht 63.0 in | Wt 151.4 lb

## 2021-07-20 DIAGNOSIS — Z79899 Other long term (current) drug therapy: Secondary | ICD-10-CM | POA: Diagnosis not present

## 2021-07-20 DIAGNOSIS — E1122 Type 2 diabetes mellitus with diabetic chronic kidney disease: Secondary | ICD-10-CM | POA: Diagnosis not present

## 2021-07-20 DIAGNOSIS — E785 Hyperlipidemia, unspecified: Secondary | ICD-10-CM

## 2021-07-20 DIAGNOSIS — R0989 Other specified symptoms and signs involving the circulatory and respiratory systems: Secondary | ICD-10-CM | POA: Diagnosis not present

## 2021-07-20 DIAGNOSIS — N182 Chronic kidney disease, stage 2 (mild): Secondary | ICD-10-CM

## 2021-07-20 DIAGNOSIS — E1169 Type 2 diabetes mellitus with other specified complication: Secondary | ICD-10-CM

## 2021-07-20 DIAGNOSIS — E559 Vitamin D deficiency, unspecified: Secondary | ICD-10-CM

## 2021-07-21 ENCOUNTER — Other Ambulatory Visit: Payer: Self-pay | Admitting: Internal Medicine

## 2021-07-21 DIAGNOSIS — D649 Anemia, unspecified: Secondary | ICD-10-CM

## 2021-07-21 DIAGNOSIS — D518 Other vitamin B12 deficiency anemias: Secondary | ICD-10-CM

## 2021-07-21 DIAGNOSIS — D509 Iron deficiency anemia, unspecified: Secondary | ICD-10-CM

## 2021-07-21 DIAGNOSIS — D529 Folate deficiency anemia, unspecified: Secondary | ICD-10-CM

## 2021-07-21 DIAGNOSIS — N182 Chronic kidney disease, stage 2 (mild): Secondary | ICD-10-CM

## 2021-07-21 LAB — COMPLETE METABOLIC PANEL WITH GFR
AG Ratio: 1.5 (calc) (ref 1.0–2.5)
ALT: 11 U/L (ref 6–29)
AST: 11 U/L (ref 10–35)
Albumin: 4.3 g/dL (ref 3.6–5.1)
Alkaline phosphatase (APISO): 95 U/L (ref 37–153)
BUN/Creatinine Ratio: 17 (calc) (ref 6–22)
BUN: 16 mg/dL (ref 7–25)
CO2: 24 mmol/L (ref 20–32)
Calcium: 9.6 mg/dL (ref 8.6–10.4)
Chloride: 105 mmol/L (ref 98–110)
Creat: 0.96 mg/dL — ABNORMAL HIGH (ref 0.60–0.95)
Globulin: 2.9 g/dL (calc) (ref 1.9–3.7)
Glucose, Bld: 264 mg/dL — ABNORMAL HIGH (ref 65–99)
Potassium: 3.7 mmol/L (ref 3.5–5.3)
Sodium: 139 mmol/L (ref 135–146)
Total Bilirubin: 0.4 mg/dL (ref 0.2–1.2)
Total Protein: 7.2 g/dL (ref 6.1–8.1)
eGFR: 59 mL/min/{1.73_m2} — ABNORMAL LOW (ref >=60)

## 2021-07-21 LAB — CBC WITH DIFFERENTIAL/PLATELET
Absolute Monocytes: 495 cells/uL (ref 200–950)
Basophils Absolute: 33 cells/uL (ref 0–200)
Basophils Relative: 0.5 %
Eosinophils Absolute: 0 cells/uL — ABNORMAL LOW (ref 15–500)
Eosinophils Relative: 0 %
HCT: 29 % — ABNORMAL LOW (ref 35.0–45.0)
Hemoglobin: 8 g/dL — ABNORMAL LOW (ref 11.7–15.5)
Lymphs Abs: 1076 cells/uL (ref 850–3900)
MCH: 18.2 pg — ABNORMAL LOW (ref 27.0–33.0)
MCHC: 27.6 g/dL — ABNORMAL LOW (ref 32.0–36.0)
MCV: 65.9 fL — ABNORMAL LOW (ref 80.0–100.0)
MPV: 10.6 fL (ref 7.5–12.5)
Monocytes Relative: 7.5 %
Neutro Abs: 4996 cells/uL (ref 1500–7800)
Neutrophils Relative %: 75.7 %
Platelets: 442 10*3/uL — ABNORMAL HIGH (ref 140–400)
RBC: 4.4 10*6/uL (ref 3.80–5.10)
RDW: 16.3 % — ABNORMAL HIGH (ref 11.0–15.0)
Total Lymphocyte: 16.3 %
WBC: 6.6 10*3/uL (ref 3.8–10.8)

## 2021-07-21 LAB — LIPID PANEL
Cholesterol: 222 mg/dL — ABNORMAL HIGH (ref <200)
HDL: 61 mg/dL (ref >=50)
LDL Cholesterol (Calc): 121 mg/dL (calc) — ABNORMAL HIGH
Non-HDL Cholesterol (Calc): 161 mg/dL (calc) — ABNORMAL HIGH (ref <130)
Total CHOL/HDL Ratio: 3.6 (calc) (ref <5.0)
Triglycerides: 247 mg/dL — ABNORMAL HIGH (ref <150)

## 2021-07-21 LAB — HEMOGLOBIN A1C
Hgb A1c MFr Bld: 7.9 % of total Hgb — ABNORMAL HIGH (ref <5.7)
Mean Plasma Glucose: 180 mg/dL
eAG (mmol/L): 10 mmol/L

## 2021-07-21 LAB — CBC MORPHOLOGY

## 2021-07-21 LAB — TSH: TSH: 2.92 mIU/L (ref 0.40–4.50)

## 2021-07-21 LAB — VITAMIN D 25 HYDROXY (VIT D DEFICIENCY, FRACTURES): Vit D, 25-Hydroxy: 54 ng/mL (ref 30–100)

## 2021-07-21 LAB — INSULIN, RANDOM: Insulin: 27.8 u[IU]/mL — ABNORMAL HIGH

## 2021-07-21 LAB — MAGNESIUM: Magnesium: 1.9 mg/dL (ref 1.5–2.5)

## 2021-07-23 ENCOUNTER — Ambulatory Visit (INDEPENDENT_AMBULATORY_CARE_PROVIDER_SITE_OTHER): Payer: Medicare Other

## 2021-07-23 DIAGNOSIS — N182 Chronic kidney disease, stage 2 (mild): Secondary | ICD-10-CM

## 2021-07-23 DIAGNOSIS — E1122 Type 2 diabetes mellitus with diabetic chronic kidney disease: Secondary | ICD-10-CM | POA: Diagnosis not present

## 2021-07-23 DIAGNOSIS — D518 Other vitamin B12 deficiency anemias: Secondary | ICD-10-CM | POA: Diagnosis not present

## 2021-07-23 DIAGNOSIS — D649 Anemia, unspecified: Secondary | ICD-10-CM | POA: Diagnosis not present

## 2021-07-23 DIAGNOSIS — D529 Folate deficiency anemia, unspecified: Secondary | ICD-10-CM | POA: Diagnosis not present

## 2021-07-23 DIAGNOSIS — D508 Other iron deficiency anemias: Secondary | ICD-10-CM

## 2021-07-23 DIAGNOSIS — D509 Iron deficiency anemia, unspecified: Secondary | ICD-10-CM | POA: Diagnosis not present

## 2021-07-23 LAB — COMPLETE METABOLIC PANEL WITH GFR
AG Ratio: 1.5 (calc) (ref 1.0–2.5)
ALT: 9 U/L (ref 6–29)
AST: 12 U/L (ref 10–35)
Albumin: 4.4 g/dL (ref 3.6–5.1)
Alkaline phosphatase (APISO): 105 U/L (ref 37–153)
BUN: 13 mg/dL (ref 7–25)
CO2: 26 mmol/L (ref 20–32)
Calcium: 9.2 mg/dL (ref 8.6–10.4)
Chloride: 103 mmol/L (ref 98–110)
Creat: 0.73 mg/dL (ref 0.60–0.95)
Globulin: 2.9 g/dL (calc) (ref 1.9–3.7)
Glucose, Bld: 165 mg/dL — ABNORMAL HIGH (ref 65–99)
Potassium: 4 mmol/L (ref 3.5–5.3)
Sodium: 138 mmol/L (ref 135–146)
Total Bilirubin: 0.4 mg/dL (ref 0.2–1.2)
Total Protein: 7.3 g/dL (ref 6.1–8.1)
eGFR: 82 mL/min/{1.73_m2} (ref >=60)

## 2021-07-23 LAB — CBC WITH DIFFERENTIAL/PLATELET
Absolute Monocytes: 462 cells/uL (ref 200–950)
Basophils Absolute: 63 cells/uL (ref 0–200)
Basophils Relative: 0.9 %
Eosinophils Absolute: 0 cells/uL — ABNORMAL LOW (ref 15–500)
Eosinophils Relative: 0 %
HCT: 30.8 % — ABNORMAL LOW (ref 35.0–45.0)
Hemoglobin: 8.8 g/dL — ABNORMAL LOW (ref 11.7–15.5)
Lymphs Abs: 1218 cells/uL (ref 850–3900)
MCH: 18 pg — ABNORMAL LOW (ref 27.0–33.0)
MCHC: 28.6 g/dL — ABNORMAL LOW (ref 32.0–36.0)
MCV: 63.1 fL — ABNORMAL LOW (ref 80.0–100.0)
MPV: 10.2 fL (ref 7.5–12.5)
Monocytes Relative: 6.6 %
Neutro Abs: 5257 cells/uL (ref 1500–7800)
Neutrophils Relative %: 75.1 %
Platelets: 412 10*3/uL — ABNORMAL HIGH (ref 140–400)
RBC: 4.88 10*6/uL (ref 3.80–5.10)
RDW: 16.8 % — ABNORMAL HIGH (ref 11.0–15.0)
Total Lymphocyte: 17.4 %
WBC: 7 10*3/uL (ref 3.8–10.8)

## 2021-07-23 MED ORDER — GLUCOSE BLOOD VI STRP: ORAL_STRIP | 12 refills | Status: DC

## 2021-07-23 MED ORDER — BLOOD GLUCOSE MONITORING SUPPL DEVI: 0 refills | Status: DC

## 2021-07-23 MED ORDER — FOLIVANE-F 125-1 MG PO CAPS: ORAL_CAPSULE | ORAL | 1 refills | Status: DC

## 2021-07-23 MED ORDER — LANCETS MISC: 12 refills | Status: DC

## 2021-07-23 NOTE — Progress Notes (Signed)
Patient presents to the office for a nurse visit to have labs done. Also, checking patient's blood sugar today. Reading was 154. States that she had eaten a banana and drank a diet soda 1.5 hours ago. Will return to the office tomorrow for teaching on how to use a glucometer.

## 2021-07-23 NOTE — Progress Notes (Signed)
<><><><><><><><><><><><><><><><><><><><><><><><><><><><><><><><><> <><><><><><><><><><><><><><><><><><><><><><><><><><><><><><><><><> -   Test results slightly outside the reference range are not unusual. If there is anything important, I will review this with you,  otherwise it is considered normal test values.  If you have further questions,  please do not hesitate to contact me at the office or via My Chart.  <><><><><><><><><><><><><><><><><><><><><><><><><><><><><><><><><> <><><><><><><><><><><><><><><><><><><><><><><><><><><><><><><><><>  -  CBC  show Red  cell count is still low, But is much better  - up - 10 %.  - Glucose = 165 - high, but better ( was   264 )   So                                                 - Avoid Sweets, Candy & White Stuff   - White Rice, White [place], White Flour  - Breads &  Pasta <><><><><><><><><><><><><><><><><><><><><><><><><><><><><><><><><>

## 2021-07-26 NOTE — Progress Notes (Signed)
<><><><><><><><><><><><><><><><><><><><><><><><><><><><><><><><><> <><><><><><><><><><><><><><><><><><><><><><><><><><><><><><><><><>  -   Iron & Folate levels are both Normal  <><><><><><><><><><><><><><><><><><><><><><><><><><><><><><><><><> <><><><><><><><><><><><><><><><><><><><><><><><><><><><><><><><><>  -  Vitamin B12 =    383    Very Low                                                             (Ideal or Goal Vit B12 is between 450 - 1,100)     Low Vit B12 may be associated with Anemia , Fatigue,                                    Peripheral Neuropathy, Dementia, "Brain Fog", & Depression   - Recommend take a sub-lingual form of Vitamin B12 tablet   1,000 to 5,000 mcg tab that you dissolve under your tongue /Daily   - Can get Baron Sane - best price at [employer] or on [employer]  <><><><><><><><><><><><><><><><><><><><><><><><><><><><><><><><><> <><><><><><><><><><><><><><><><><><><><><><><><><><><><><><><><><>

## 2021-07-27 ENCOUNTER — Ambulatory Visit (INDEPENDENT_AMBULATORY_CARE_PROVIDER_SITE_OTHER): Payer: Medicare Other

## 2021-07-27 ENCOUNTER — Telehealth: Payer: Self-pay

## 2021-07-27 DIAGNOSIS — R7309 Other abnormal glucose: Secondary | ICD-10-CM

## 2021-07-27 NOTE — Telephone Encounter (Signed)
See Nurse Visit note

## 2021-07-27 NOTE — Progress Notes (Signed)
Patient presents to the office for teaching on how to use a glucometer. Today's fasting sugar was 208. Instructed patient to check daily and keep a log.

## 2021-07-28 LAB — VITAMIN B12: Vitamin B-12: 383 pg/mL (ref 200–1100)

## 2021-07-28 LAB — IRON, TOTAL/TOTAL IRON BINDING CAP
%SAT: 17 % (calc) (ref 16–45)
Iron: 88 ug/dL (ref 45–160)
TIBC: 520 mcg/dL (calc) — ABNORMAL HIGH (ref 250–450)

## 2021-07-28 LAB — METHYLMALONIC ACID, SERUM: Methylmalonic Acid, Quant: 155 nmol/L (ref 87–318)

## 2021-07-28 LAB — FOLATE RBC: RBC Folate: 891 ng/mL RBC (ref >280)

## 2021-07-28 LAB — FERRITIN: Ferritin: 45 ng/mL (ref 16–288)

## 2021-08-10 ENCOUNTER — Ambulatory Visit
Admission: EM | Admit: 2021-08-10 | Discharge: 2021-08-10 | Disposition: A | Discharge status: Home / Self Care | Payer: Medicare Other

## 2021-08-10 ENCOUNTER — Encounter: Payer: Self-pay | Admitting: Emergency Medicine

## 2021-08-10 DIAGNOSIS — F410 Panic disorder [episodic paroxysmal anxiety] without agoraphobia: Secondary | ICD-10-CM | POA: Diagnosis not present

## 2021-08-10 DIAGNOSIS — F430 Acute stress reaction: Secondary | ICD-10-CM
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
## 2021-10-01 ENCOUNTER — Other Ambulatory Visit: Payer: Self-pay | Admitting: Internal Medicine

## 2021-10-01 DIAGNOSIS — Z1231 Encounter for screening mammogram for malignant neoplasm of breast: Secondary | ICD-10-CM

## 2021-10-27 NOTE — Progress Notes (Unsigned)
MEDICARE ANNUAL WELLNESS VISIT AND 3 MONTH FOLLOW  Assessment:   Stephanie Roach was seen today for annual exam.  Diagnoses and all orders for this visit:  Encounter for medicare annual wellness visit Due Annually Mammogram 10/2020- will call to schedule Behind on DEXA- last 2017 showed osteoporosis T score - 3.0  Current mild episode of major depressive disorder, unspecified whether recurrent (HCC) Continue Effexor and Wellbutrin, Behavior modifications of diet, exercise and Good sleep hygiene  Labile hypertension Currently controlled without medication -     CBC with Differential/Platelet -     EKG 12-Lead -     Urinalysis, Routine w reflex microscopic -     Microalbumin / creatinine urine ratio  Hyperlipidemia associate with Type 2 Diabetes Mellitus Continue diet and exercise Discussed possible need for statin if remains elevated -     COMPLETE METABOLIC PANEL WITH GFR -     Lipid panel -     TSH   CKD stage 2 associated with Type 2 Diabetes mellitus(HCC) Increase fluids, avoid NSAIDS, monitor sugars, will monitor  Chronic pain of both knees Continue exercise, follow up with ortho as needed  Chronic anxiety Continue Effexor and encouraged to restart Wellbutrin Monitor symptoms, encourage behavior modifications exercise and good sleep hygiene  Type 2 Diabetes Mellitus with Stage 2 CKD Continue medications: Continue diet and exercise.  Perform daily foot/skin check, notify office of any concerning changes.  Check A1C   Vitamin D deficiency -     VITAMIN D 25 Hydroxy (Vit-D Deficiency, Fractures)  Medication management -     Magnesium - CBC - CMP - TSH  Iron deficiency anemia, unspecified iron deficiency anemia type -     CBC with Differential/Platelet   Osteoporosis, unspecified osteoporosis type, unspecified pathological fracture presence Continue Calcium and Vit D3 , and weight bearing exercises DEXA ordered.   Hx of adenomatous colonic polyps Monitor for  symptoms, has aged out of colonoscopy  Flu vaccine need - High dose flu vaccine given    Future Appointments  Date Time Provider [place]  10/29/2021 11:40 AM [facility]  02/17/2022 10:00 AM Stephanie Rossetti, NP [facility] None  05/26/2022 11:00 AM Stephanie Rossetti, NP [facility] None     Plan:   During the course of the visit the patient was educated and counseled about appropriate screening and preventive services including:   Pneumococcal vaccine  Prevnar 13 Influenza vaccine Td vaccine Screening electrocardiogram Bone densitometry screening Colorectal cancer screening Diabetes screening Glaucoma screening Nutrition counseling  Advanced directives: requested   Subjective:   Stephanie Roach is a 86 y.o. female who presents for CPE and follow up for chol, preDM, and HTN.   She is having difficulties with her grandson who is in jail.  He is in jail for pulling a gun on his brother. She worries a lot about him.    Her blood pressure has been controlled at home, today their BP is BP: 120/78  BP Readings from Last 3 Encounters:  10/28/21 120/78  08/10/21 136/84  07/23/21 108/70  She does not workout.  She denies chest pain,dizziness.   BMI is Body mass index is 24.84 kg/m., she has been working on diet and exercise. She has stopped eating sweets.  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 She is on integra and following with Dr. Julien Roach. Lab Results  Component Value Date   IRON 88 07/23/2021   TIBC 520 (H) 07/23/2021   FERRITIN 45 07/23/2021   Husband, Stephanie Roach is deceased.  Continues to have issues with depression/anxiety. She does have limited interactions with her friends. She is on Effexor 150 mg daily, Wellbutrin 150 mg QD and  Ativan 1 mg as needed.   She is not on cholesterol medication and denies myalgias. Her cholesterol is not at goal. The cholesterol last visit was:  Reluctant to try medications, wants to try diet and exercise Lab Results  Component Value Date   CHOL 222 (H) 07/20/2021   HDL 61 07/20/2021   LDLCALC 121 (H) 07/20/2021   TRIG 247 (H) 07/20/2021   CHOLHDL 3.6 07/20/2021    She has been working on diet and exercise for prediabetes, and denies polydipsia, polyuria and visual disturbances. Last A1C in the office was:  Lab Results  Component Value Date   HGBA1C 7.9 (H) 07/20/2021   Patient is on Vitamin D supplement, 2000 IU.   Lab Results  Component Value Date   VD25OH 54 07/20/2021     Saw Dr. Berenice Roach for bilateral knee pain, got injections that helped. Has not had an injection for awhile, pain persists continually. She is having continued knee pain and needs to have another visit for injections   Immunization History  Administered Date(s) Administered   Influenza, High Dose Seasonal PF 12/29/2018, 01/01/2021   PFIZER(Purple Top)SARS-COV-2 Vaccination 05/07/2019, 05/30/2019   Pneumococcal Conjugate-13 08/27/2013   Pneumococcal Polysaccharide-23 10/23/2015   Pneumococcal-Unspecified 12/06/2001   Tdap 08/05/2012   Zoster, Live 02/05/2010   Health Maintenance  Topic Date Due   Zoster Vaccines- Shingrix (1 of 2) Never done   COVID-19 Vaccine (3 - Pfizer risk series) 06/27/2019   INFLUENZA VACCINE  08/25/2021   Diabetic kidney evaluation - Urine ACR  01/01/2022   Diabetic kidney evaluation - GFR measurement  07/24/2022   TETANUS/TDAP  08/06/2022   Pneumonia Vaccine 7+ Years old  Completed   DEXA SCAN  Completed   HPV VACCINES  Aged Out   COLONOSCOPY (Pts 45-79yr Insurance coverage will need to be confirmed)  Discontinued    Mammogram: 10/27/20 Negative Colonoscopy: 2015 , no follow up recommended DEXA: 2017 osteoporosis, on Vit D3  Names of Other Physician/Practitioners you  currently use: 1. [facility]- here for primary care 2. Stephanie Roach eye doctor, 03/2021 3. Stephanie Roach 06/2021 Patient Care Team: Stephanie Pinto MD as PCP - General (Internal Medicine) Stephanie Dragon MD (Inactive) as Consulting Physician (Gastroenterology) PNorma Fredrickson MD as Consulting Physician (Psychiatry) MCurt Bears MD as Consulting Physician (Oncology) GBerenice Primas MD as Referring Physician (Orthopedic Surgery) KDaryll Brod MD as Consulting Physician (Orthopedic Surgery)   Medication Review Current Outpatient Medications on File Prior to Visit  Medication Sig Dispense Refill   buPROPion (WELLBUTRIN XL) 150 MG 24 hr tablet Take 150 mg by mouth daily.     Cholecalciferol (VITAMIN D3) 125 MCG (5000 UT) CAPS Take by mouth.     Cyanocobalamin (VITAMIN B-12 PO) Take by mouth.     Fe Fum-FePoly-FA-Vit C-Vit B3 (FOLIVANE-F) 125-1 MG CAPS Take 1 capsule three times a day with meals. 180 capsule 1   LORazepam (ATIVAN) 1 MG tablet Take 1 mg by mouth 2 (two) times daily.     traZODone (DESYREL) 50 MG tablet Take  1 to 2 tablets  1 hour  before Bedtime as needed for Sleep 60 tablet 0   venlafaxine XR (EFFEXOR-XR) 150 MG 24 hr capsule Take 150 mg by mouth at bedtime.     Blood Glucose Monitoring Suppl DEVI Use to check blood sugar once daily or as directed. 1 each 0   glucose blood test strip Use to check blood sugar once daily or as directed. 100 each 12   Lancets MISC Use to check blood sugar once daily or as directed. 200 each 12   No current facility-administered medications on file prior to visit.    Current Problems (verified) Patient Active Problem List   Diagnosis Date Noted   Grief reaction 04/02/2020   S/P lumpectomy, right breast 09/17/2019   Medication management 02/18/2015   Hiatal hernia 02/18/2015   Abnormal glucose    Hyperlipidemia, mixed    Labile hypertension    Vitamin D deficiency    Hx of adenomatous colonic  polyps 08/11/2010   Iron deficiency anemia 08/11/2010   Chronic anxiety 08/11/2010    Screening Tests Immunization History  Administered Date(s) Administered   Influenza, High Dose Seasonal PF 12/29/2018, 01/01/2021   PFIZER(Purple Top)SARS-COV-2 Vaccination 05/07/2019, 05/30/2019   Pneumococcal Conjugate-13 08/27/2013   Pneumococcal Polysaccharide-23 10/23/2015   Pneumococcal-Unspecified 12/06/2001   Tdap 08/05/2012   Zoster, Live 02/05/2010    Health Maintenance  Topic Date Due   Zoster Vaccines- Shingrix (1 of 2) Never done   COVID-19 Vaccine (3 - Pfizer risk series) 06/27/2019   INFLUENZA VACCINE  08/25/2021   Diabetic kidney evaluation - Urine ACR  01/01/2022   Diabetic kidney evaluation - GFR measurement  07/24/2022   TETANUS/TDAP  08/06/2022   Pneumonia Vaccine 63+ Years old  Completed   DEXA SCAN  Completed   HPV VACCINES  Aged Out   COLONOSCOPY (Pts 45-83yr Insurance coverage will need to be confirmed)  Discontinued     Allergies Allergies  Allergen Reactions   Morphine And Related     Unknown     SURGICAL HISTORY She  has a past surgical history that includes Cholecystectomy (2003); Knee arthroscopy; Bunionectomy; Wrist fracture surgery; Cataract extraction (Bilateral); Breast lumpectomy with radioactive seed localization (Right, 07/05/2019); Breast biopsy (Right, 10/19/2018); and Breast excisional biopsy (Right, 07/05/2019). FAMILY HISTORY Her family history includes Breast cancer in her cousin; Colon cancer in her father; Colon polyps in her maternal aunt; Pancreatic cancer in her paternal aunt; Stroke in her mother; Ulcerative colitis in her brother. SOCIAL HISTORY She  reports that she has never smoked. She has never used smokeless tobacco. She reports that she does not drink alcohol and does not use drugs.  MEDICARE WELLNESS OBJECTIVES: Physical activity: Current Exercise Habits: Home exercise routine, Type of exercise: walking, Time (Minutes): 10,  Frequency (Times/Week): 3, Weekly Exercise (Minutes/Week): 30, Intensity: Mild, Exercise limited by: orthopedic condition(s) Cardiac risk factors: Cardiac Risk Factors include: advanced age (>566m, >6>18omen);diabetes mellitus;dyslipidemia;hypertension;sedentary lifestyle Depression/mood screen:      10/28/2021   12:04 PM  Depression screen PHQ 2/9  Decreased Interest 2  Down, Depressed, Hopeless 2  PHQ - 2 Score 4  Altered sleeping 1  Tired, decreased energy 1  Change in appetite 1  Feeling bad or failure about yourself  0  Trouble concentrating 0  Moving slowly or fidgety/restless 0  Suicidal thoughts 0  PHQ-9 Score 7  Difficult doing work/chores Somewhat difficult    ADLs:     10/28/2021   12:04 PM 04/16/2021   11:40  AM  In your present state of health, do you have any difficulty performing the following activities:  Hearing? 0 0  Vision? 0 0  Difficulty concentrating or making decisions? 0 0  Walking or climbing stairs? 1 1  Comment  pain in knees  Dressing or bathing? 0 0  Doing errands, shopping? 0 0     Cognitive Testing  Alert? Yes  Normal Appearance?Yes  Oriented to person? Yes  Place? Yes   Time? Yes  Recall of three objects?  Yes  Can perform simple calculations? Yes  Displays appropriate judgment?Yes  Can read the correct time from a watch face?Yes  EOL planning: Does Patient Have a Medical Advance Directive?: Yes Type of Advance Directive: Healthcare Power of Attorney, Living will Does patient want to make changes to medical advance directive?: No - Patient declined Copy of [place] in Chart?: No - copy requested     Review of Systems  Constitutional:  Positive for malaise/fatigue. Negative for chills, diaphoresis, fever and weight loss.  HENT: Negative.  Negative for congestion, hearing loss, sinus pain, sore throat and tinnitus.   Eyes: Negative.  Negative for blurred vision and double vision.  Respiratory: Negative.  Negative  for cough, hemoptysis, sputum production, shortness of breath and wheezing.   Cardiovascular: Negative.  Negative for chest pain, palpitations and leg swelling.  Gastrointestinal:  Positive for heartburn (Tums). Negative for abdominal pain, constipation, diarrhea, nausea and vomiting.  Genitourinary: Negative.  Negative for dysuria and urgency.  Musculoskeletal:  Positive for joint pain (knees). Negative for back pain, falls, myalgias and neck pain.  Skin: Negative.  Negative for rash.  Neurological: Negative.  Negative for dizziness, tingling, tremors, weakness and headaches.  Endo/Heme/Allergies:  Does not bruise/bleed easily.  Psychiatric/Behavioral:  Positive for depression. Negative for suicidal ideas. The patient has insomnia (bad dreams). The patient is not nervous/anxious.      Objective:     Blood pressure 120/78, pulse 83, temperature (!) 97.5 F (36.4 C), height '5\' 3"'$  (1.6 m), weight 140 lb 3.2 oz (63.6 kg), SpO2 95 %. Body mass index is 24.84 kg/m.  General appearance: alert, no distress, WD/WN,  female HEENT: normocephalic, sclerae anicteric, TMs pearly, nares patent, no discharge or erythema, pharynx normal Oral cavity: MMM, no lesions Neck: supple, no lymphadenopathy, no thyromegaly, no masses Heart: RRR, normal S1, S2, no murmurs Lungs: CTA bilaterally, no wheezes, rhonchi, or rales Abdomen: +bs, soft, non tender, non distended, no masses, no hepatomegaly, no splenomegaly Musculoskeletal: nontender, no swelling, no obvious deformity.  Extremities: no edema, no cyanosis, no clubbing Pulses: 2+ symmetric, upper and lower extremities, normal cap refill Neurological: alert, oriented x 3, CN2-12 intact, strength normal upper extremities and lower extremities, sensation normal throughout, DTRs 2+ throughout, no cerebellar signs, gait normal Psychiatric: normal affect, behavior normal, pleasant  Breast:   breasts appear normal, no suspicious masses, no skin or nipple changes  or axillary nodes.    Medicare Attestation I have personally reviewed: The patient's medical and social history Their use of alcohol, tobacco or illicit drugs Their current medications and supplements The patient's functional ability including ADLs,fall risks, home safety risks, cognitive, and hearing and visual impairment Diet and physical activities Evidence for depression or mood disorders  The patient's weight, height, BMI, and visual acuity have been recorded in the chart.  I have made referrals, counseling, and provided education to the patient based on review of the above and I have provided the patient with a written personalized  care plan for preventive services.     Stephanie Rossetti, NP   10/28/2021

## 2021-10-28 ENCOUNTER — Ambulatory Visit (INDEPENDENT_AMBULATORY_CARE_PROVIDER_SITE_OTHER): Payer: Medicare Other | Admitting: Nurse Practitioner

## 2021-10-28 ENCOUNTER — Encounter: Payer: Self-pay | Admitting: Nurse Practitioner

## 2021-10-28 VITALS — BP 120/78 | HR 83 | Temp 97.5°F | Ht 63.0 in | Wt 140.2 lb

## 2021-10-28 DIAGNOSIS — Z0001 Encounter for general adult medical examination with abnormal findings: Secondary | ICD-10-CM

## 2021-10-28 DIAGNOSIS — Z23 Encounter for immunization: Secondary | ICD-10-CM | POA: Diagnosis not present

## 2021-10-28 DIAGNOSIS — Z79899 Other long term (current) drug therapy: Secondary | ICD-10-CM | POA: Diagnosis not present

## 2021-10-28 DIAGNOSIS — E1169 Type 2 diabetes mellitus with other specified complication: Secondary | ICD-10-CM | POA: Diagnosis not present

## 2021-10-28 DIAGNOSIS — R0989 Other specified symptoms and signs involving the circulatory and respiratory systems: Secondary | ICD-10-CM

## 2021-10-28 DIAGNOSIS — G4700 Insomnia, unspecified: Secondary | ICD-10-CM | POA: Diagnosis not present

## 2021-10-28 DIAGNOSIS — N182 Chronic kidney disease, stage 2 (mild): Secondary | ICD-10-CM | POA: Diagnosis not present

## 2021-10-28 DIAGNOSIS — D509 Iron deficiency anemia, unspecified: Secondary | ICD-10-CM | POA: Diagnosis not present

## 2021-10-28 DIAGNOSIS — E559 Vitamin D deficiency, unspecified: Secondary | ICD-10-CM

## 2021-10-28 DIAGNOSIS — F419 Anxiety disorder, unspecified: Secondary | ICD-10-CM

## 2021-10-28 DIAGNOSIS — M810 Age-related osteoporosis without current pathological fracture: Secondary | ICD-10-CM | POA: Diagnosis not present

## 2021-10-28 DIAGNOSIS — G8929 Other chronic pain: Secondary | ICD-10-CM

## 2021-10-28 DIAGNOSIS — Z860101 Personal history of adenomatous and serrated colon polyps: Secondary | ICD-10-CM

## 2021-10-28 DIAGNOSIS — E785 Hyperlipidemia, unspecified: Secondary | ICD-10-CM | POA: Diagnosis not present

## 2021-10-28 DIAGNOSIS — Z86010 Personal history of colonic polyps: Secondary | ICD-10-CM

## 2021-10-28 DIAGNOSIS — M25561 Pain in right knee: Secondary | ICD-10-CM

## 2021-10-28 DIAGNOSIS — R6889 Other general symptoms and signs: Secondary | ICD-10-CM

## 2021-10-28 DIAGNOSIS — F320 Major depressive disorder, single episode, mild: Secondary | ICD-10-CM

## 2021-10-28 DIAGNOSIS — E1122 Type 2 diabetes mellitus with diabetic chronic kidney disease: Secondary | ICD-10-CM | POA: Diagnosis not present

## 2021-10-28 DIAGNOSIS — Z0000 Encounter for general adult medical examination without abnormal findings: Secondary | ICD-10-CM

## 2021-10-28 MED ORDER — HYDROXYZINE HCL 25 MG PO TABS: 25.0000 mg | ORAL_TABLET | Freq: Every day | ORAL | 0 refills | Status: DC

## 2021-10-28 NOTE — Patient Instructions (Signed)

## 2021-10-29 ENCOUNTER — Other Ambulatory Visit: Payer: Self-pay | Admitting: Nurse Practitioner

## 2021-10-29 ENCOUNTER — Ambulatory Visit
Admission: RE | Admit: 2021-10-29 | Discharge: 2021-10-29 | Disposition: A | Discharge status: Home / Self Care | Payer: Medicare Other | Source: Ambulatory Visit | Attending: Internal Medicine | Admitting: Internal Medicine

## 2021-10-29 DIAGNOSIS — N182 Chronic kidney disease, stage 2 (mild): Secondary | ICD-10-CM

## 2021-10-29 DIAGNOSIS — E1169 Type 2 diabetes mellitus with other specified complication: Secondary | ICD-10-CM

## 2021-10-29 DIAGNOSIS — Z1231 Encounter for screening mammogram for malignant neoplasm of breast: Secondary | ICD-10-CM | POA: Diagnosis not present

## 2021-10-29 LAB — HEMOGLOBIN A1C
Hgb A1c MFr Bld: 7 % of total Hgb — ABNORMAL HIGH (ref <5.7)
Mean Plasma Glucose: 154 mg/dL
eAG (mmol/L): 8.5 mmol/L

## 2021-10-29 LAB — CBC WITH DIFFERENTIAL/PLATELET
Absolute Monocytes: 470 cells/uL (ref 200–950)
Basophils Absolute: 31 cells/uL (ref 0–200)
Basophils Relative: 0.5 %
Eosinophils Absolute: 0 cells/uL — ABNORMAL LOW (ref 15–500)
Eosinophils Relative: 0 %
HCT: 46.5 % — ABNORMAL HIGH (ref 35.0–45.0)
Hemoglobin: 15.1 g/dL (ref 11.7–15.5)
Lymphs Abs: 1129 cells/uL (ref 850–3900)
MCH: 25.5 pg — ABNORMAL LOW (ref 27.0–33.0)
MCHC: 32.5 g/dL (ref 32.0–36.0)
MCV: 78.5 fL — ABNORMAL LOW (ref 80.0–100.0)
MPV: 9.8 fL (ref 7.5–12.5)
Monocytes Relative: 7.7 %
Neutro Abs: 4471 cells/uL (ref 1500–7800)
Neutrophils Relative %: 73.3 %
Platelets: 367 10*3/uL (ref 140–400)
RBC: 5.92 10*6/uL — ABNORMAL HIGH (ref 3.80–5.10)
RDW: 15.2 % — ABNORMAL HIGH (ref 11.0–15.0)
Total Lymphocyte: 18.5 %
WBC: 6.1 10*3/uL (ref 3.8–10.8)

## 2021-10-29 LAB — LIPID PANEL
Cholesterol: 268 mg/dL — ABNORMAL HIGH (ref <200)
HDL: 65 mg/dL (ref >=50)
LDL Cholesterol (Calc): 170 mg/dL (calc) — ABNORMAL HIGH
Non-HDL Cholesterol (Calc): 203 mg/dL (calc) — ABNORMAL HIGH (ref <130)
Total CHOL/HDL Ratio: 4.1 (calc) (ref <5.0)
Triglycerides: 177 mg/dL — ABNORMAL HIGH (ref <150)

## 2021-10-29 LAB — COMPLETE METABOLIC PANEL WITH GFR
AG Ratio: 1.8 (calc) (ref 1.0–2.5)
ALT: 12 U/L (ref 6–29)
AST: 12 U/L (ref 10–35)
Albumin: 4.6 g/dL (ref 3.6–5.1)
Alkaline phosphatase (APISO): 99 U/L (ref 37–153)
BUN/Creatinine Ratio: 31 (calc) — ABNORMAL HIGH (ref 6–22)
BUN: 18 mg/dL (ref 7–25)
CO2: 26 mmol/L (ref 20–32)
Calcium: 10.2 mg/dL (ref 8.6–10.4)
Chloride: 105 mmol/L (ref 98–110)
Creat: 0.58 mg/dL — ABNORMAL LOW (ref 0.60–0.95)
Globulin: 2.5 g/dL (calc) (ref 1.9–3.7)
Glucose, Bld: 157 mg/dL — ABNORMAL HIGH (ref 65–99)
Potassium: 4.5 mmol/L (ref 3.5–5.3)
Sodium: 142 mmol/L (ref 135–146)
Total Bilirubin: 0.3 mg/dL (ref 0.2–1.2)
Total Protein: 7.1 g/dL (ref 6.1–8.1)
eGFR: 90 mL/min/{1.73_m2} (ref >=60)

## 2021-10-29 LAB — TSH: TSH: 2.05 mIU/L (ref 0.40–4.50)

## 2021-10-29 MED ORDER — METFORMIN HCL 500 MG PO TABS: 500.0000 mg | ORAL_TABLET | Freq: Every day | ORAL | 11 refills | Status: DC

## 2021-10-29 MED ORDER — ROSUVASTATIN CALCIUM 5 MG PO TABS: 5.0000 mg | ORAL_TABLET | Freq: Every day | ORAL | 11 refills | Status: DC

## 2021-11-11 ENCOUNTER — Other Ambulatory Visit: Payer: Self-pay | Admitting: Nurse Practitioner

## 2021-11-11 DIAGNOSIS — G4700 Insomnia, unspecified: Secondary | ICD-10-CM

## 2022-01-01 ENCOUNTER — Encounter: Payer: Medicare Other | Admitting: Nurse Practitioner

## 2022-02-16 DIAGNOSIS — E1122 Type 2 diabetes mellitus with diabetic chronic kidney disease: Secondary | ICD-10-CM | POA: Insufficient documentation

## 2022-02-16 NOTE — Progress Notes (Unsigned)
CPE AND 3 MONTH FOLLOW  Assessment:  Stephanie Roach was seen today for annual exam.  Diagnoses and all orders for this visit:  Encounter for general adult medical examination with abnormal findings Mammogram UTD  Current mild episode of major depressive disorder, unspecified whether recurrent (HCC) Continue Effexor and Wellbutrin, Behavior modifications of diet, exercise and Good sleep hygiene   Type 2 diabetes mellitus with stage 2 chronic kidney disease, without long-term current use of insulin ([place]) Encouraged diet and exercise Has not been taking Metfromin, reviewed the need again with patient and hs agreed to start Follow up in 3 months -     CBC with Differential/Platelet -     COMPLETE METABOLIC PANEL WITH GFR -     Hemoglobin A1c  Hyperlipidemia associated with type 2 diabetes mellitus ([place]) Continue diet and exercise Never started Rosuvastatin that was prescribed, reviewed again with patient the importance of this medication and has agreed to start Follow up in 3 months -     Lipid panel -     TSH  CKD stage 2 due to type 2 diabetes mellitus (HCC) Increase fluids, avoid NSAIDS, monitor sugars, will monitor -     CBC with Differential/Platelet -     COMPLETE METABOLIC PANEL WITH GFR -     Microalbumin / creatinine urine ratio -     Urinalysis w microscopic + reflex cultur  Vitamin D deficiency Continue Vit D supplementation to maintain value in therapeutic level of 60-100  -     VITAMIN D 25 Hydroxy (Vit-D Deficiency, Fractures)  Medication management -     Magnesium  Chronic anxiety Continue Effexor, good sleep hygiene, diet and exercise  Labile hypertension - Controlled without medication. Continue DASH diet, exercise and monitor at home. Call if greater than 130/80.   Insomnia, unspecified type Continue Trazodone as needed, practice good sleep hygiene  Osteoporosis, unspecified osteoporosis type, unspecified pathological fracture presence Dexa has been ordered  and we scheduled for patient today  Chronic pain of both knees Continue to follow with orthopedics as needed  Screening for ischemic heart disease -     EKG 12-Lead  Screening for thyroid disorder -     TSH  Screening for AAA (abdominal aortic aneurysm) -     [place], RETROPERITNL ABD,  LTD  Other iron deficiency anemia -     Fe Fum-FePoly-FA-Vit C-Vit B3 (FOLIVANE-F) 125-1 MG CAPS; Take 1 capsule three times a day with meals.  S/P lumpectomy, right breast Continue regular mammograms and breast exams Breast exam WNL today      Future Appointments  Date Time Provider [place]  05/26/2022 11:00 AM Alycia Rossetti, NP [facility] None  02/18/2023 10:00 AM Unk Pinto, MD [facility] None      Subjective:   Stephanie Roach is a 87 y.o. female who presents for CPE and follow up for chol, preDM, and HTN.   She has her daughter stay with her which does cause some stress.  She says her daughter is occasionally very irritable with her    Her blood pressure has been controlled at home, today their BP is BP: 118/82  BP Readings from Last 3 Encounters:  02/17/22 118/82  10/28/21 120/78  08/10/21 136/84  She does not workout.  She denies chest pain,dizziness.   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 She is on integra and following with Dr. Julien Nordmann. Lab Results  Component Value Date   IRON 88 08/15/21   TIBC 520 (H) Aug 15, 2021   FERRITIN 45 2021-08-15   Husband, Stephanie Roach died of Parkinsons April 08, 2020.  Continues to grieve over her husbands death. She remains on effexor 150 mg QD and Wellbutrin 150 mg QD.  She has been prescribed Rosuvastatin but has not taken.. Her cholesterol is not at goal. The cholesterol last visit was:  Reluctant to try medications, wants to try diet and exercise Lab Results  Component Value Date   CHOL 268 (H) 10/28/2021   HDL 65 10/28/2021   LDLCALC 170 (H) 10/28/2021    TRIG 177 (H) 10/28/2021   CHOLHDL 4.1 10/28/2021    She has been working on diet and exercise for prediabetes, and denies polydipsia, polyuria and visual disturbances. She is prescribed Metformin for elevated A1c but is not taking. Last A1C in the office was:  Lab Results  Component Value Date   HGBA1C 7.0 (H) 10/28/2021   Patient is on Vitamin D supplement, 2000 IU.   Lab Results  Component Value Date   VD25OH 54 07/20/2021     Saw Dr. Berenice Primas for bilateral knee pain, got injections that helped. Has not had an injection for awhile, pain persists continually. They have been a little better  BMI is Body mass index is 23.59 kg/m., she is working on diet and exercise. She has been eating Oatmeal for breakfast, usually will skip lunch and eat dinner which is usually vegetables- meat does not appeal to her. Wt Readings from Last 3 Encounters:  02/17/22 139 lb 9.6 oz (63.3 kg)  10/28/21 140 lb 3.2 oz (63.6 kg)  07/27/21 148 lb (67.1 kg)   MMSE 30/30    Names of Other Physician/Practitioners you currently use: 1. [facility]- here for primary care 2. Dr. Katy Fitch, eye doctor, last visit 3 months 3. Dr. Osa Craver, dentist, last visit 6 months Patient Care Team: Unk Pinto, MD as PCP - General (Internal Medicine) Lafayette Dragon, MD (Inactive) as Consulting Physician (Gastroenterology) Norma Fredrickson, MD as Consulting Physician (Psychiatry) Curt Bears, MD as Consulting Physician (Oncology) Berenice Primas, MD as Referring Physician (Orthopedic Surgery) Daryll Brod, MD as Consulting Physician (Orthopedic Surgery)   Medication Review Current Outpatient Medications on File Prior to Visit  Medication Sig Dispense Refill   Blood Glucose Monitoring Suppl DEVI Use to check blood sugar once daily or as directed. 1 each 0   buPROPion (WELLBUTRIN XL) 150 MG 24 hr tablet Take 150 mg by mouth daily.     Cholecalciferol (VITAMIN D3) 125 MCG  (5000 UT) CAPS Take by mouth.     Fe Fum-FePoly-FA-Vit C-Vit B3 (FOLIVANE-F) 125-1 MG CAPS Take 1 capsule three times a day with meals. 180 capsule 1   glucose blood test strip Use to check blood sugar once daily or as directed. 100 each 12   hydrOXYzine (ATARAX) 25 MG tablet TAKE 1 TABLET BY MOUTH EVERYDAY AT BEDTIME 90 tablet 1   Lancets MISC Use to check blood sugar once daily or as directed. 200 each 12   LORazepam (ATIVAN) 1 MG tablet Take 1 mg by mouth 2 (two) times daily.     traZODone (DESYREL) 50 MG tablet Take  1 to 2 tablets  1 hour  before Bedtime as needed for Sleep 60 tablet 0   venlafaxine XR (EFFEXOR-XR) 150 MG 24 hr capsule Take 150 mg by mouth at bedtime.     Cyanocobalamin (VITAMIN B-12 PO) Take by mouth. (Patient not  taking: Reported on 02/17/2022)     metFORMIN (GLUCOPHAGE) 500 MG tablet Take 1 tablet (500 mg total) by mouth daily with breakfast. (Patient not taking: Reported on 02/17/2022) 30 tablet 11   rosuvastatin (CRESTOR) 5 MG tablet Take 1 tablet (5 mg total) by mouth daily. (Patient not taking: Reported on 02/17/2022) 30 tablet 11   No current facility-administered medications on file prior to visit.    Current Problems (verified) Patient Active Problem List   Diagnosis Date Noted   CKD stage 2 due to type 2 diabetes mellitus ([place]) 02/16/2022   Grief reaction 04/02/2020   S/P lumpectomy, right breast 09/17/2019   Medication management 02/18/2015   Hiatal hernia 02/18/2015   Abnormal glucose    Hyperlipidemia associated with type 2 diabetes mellitus ([place])    Labile hypertension    Vitamin D deficiency    Hx of adenomatous colonic polyps 08/11/2010   Iron deficiency anemia 08/11/2010   Chronic anxiety 08/11/2010    Screening Tests Immunization History  Administered Date(s) Administered   Influenza, High Dose Seasonal PF 12/29/2018, 01/01/2021, 10/28/2021   PFIZER(Purple Top)SARS-COV-2 Vaccination 05/07/2019, 05/30/2019   Pneumococcal Conjugate-13  08/27/2013   Pneumococcal Polysaccharide-23 10/23/2015   Pneumococcal-Unspecified 12/06/2001   Tdap 08/05/2012   Zoster, Live 02/05/2010    Preventative care: Last colonoscopy: 2015 EGD 2015 Last mammogram:10/5/223 negative repeat 1 year Last pap smear/pelvic exam: 2012  DEXA: 09/2015 + osteoporosis off the fosamax- could not tolerate due to her stomach.   Prior vaccinations: TD or Tdap: 2014  Influenza: 01/01/21 Prevnar 13: 2015 Pneumococcal: 2017 Shingles/Zostavax: 2012  Allergies Allergies  Allergen Reactions   Morphine And Related     Unknown     SURGICAL HISTORY She  has a past surgical history that includes Cholecystectomy (2003); Knee arthroscopy; Bunionectomy; Wrist fracture surgery; Cataract extraction (Bilateral); Breast lumpectomy with radioactive seed localization (Right, 07/05/2019); Breast biopsy (Right, 10/19/2018); and Breast excisional biopsy (Right, 07/05/2019). FAMILY HISTORY Her family history includes Breast cancer in her cousin; Colon cancer in her father; Colon polyps in her maternal aunt; Pancreatic cancer in her paternal aunt; Stroke in her mother; Ulcerative colitis in her brother. SOCIAL HISTORY She  reports that she has never smoked. She has never used smokeless tobacco. She reports that she does not drink alcohol and does not use drugs.  Review of Systems  Constitutional:  Positive for malaise/fatigue. Negative for chills, diaphoresis, fever and weight loss.  HENT: Negative.  Negative for congestion, hearing loss, sinus pain, sore throat and tinnitus.   Eyes: Negative.  Negative for blurred vision and double vision.  Respiratory: Negative.  Negative for cough, hemoptysis, sputum production, shortness of breath and wheezing.   Cardiovascular: Negative.  Negative for chest pain, palpitations and leg swelling.  Gastrointestinal:  Positive for heartburn (Tums). Negative for abdominal pain, constipation, diarrhea, nausea and vomiting.  Genitourinary:  Negative.  Negative for dysuria and urgency.  Musculoskeletal:  Positive for joint pain (knees). Negative for back pain, falls, myalgias and neck pain.  Skin: Negative.  Negative for rash.  Neurological: Negative.  Negative for dizziness, tingling, tremors, weakness and headaches.  Endo/Heme/Allergies:  Does not bruise/bleed easily.  Psychiatric/Behavioral:  Positive for depression. Negative for suicidal ideas. The patient has insomnia (bad dreams). The patient is not nervous/anxious.      Objective:     Blood pressure 118/82, pulse 85, temperature 97.9 F (36.6 C), height 5' 4.5" (1.638 m), weight 139 lb 9.6 oz (63.3 kg), SpO2 97 %. Body mass index is 23.59  kg/m.  General appearance: alert, no distress, WD/WN,  female HEENT: normocephalic, sclerae anicteric, TMs pearly, nares patent, no discharge or erythema, pharynx normal Oral cavity: MMM, no lesions Neck: supple, no lymphadenopathy, no thyromegaly, no masses Heart: RRR, normal S1, S2, no murmurs Lungs: CTA bilaterally, no wheezes, rhonchi, or rales Abdomen: +bs, soft, non tender, non distended, no masses, no hepatomegaly, no splenomegaly Musculoskeletal: nontender, no swelling, no obvious deformity.  Extremities: no edema, no cyanosis, no clubbing Pulses: 2+ symmetric, upper and lower extremities, normal cap refill Neurological: alert, oriented x 3, CN2-12 intact, strength normal upper extremities and lower extremities, sensation normal throughout, DTRs 2+ throughout, no cerebellar signs, gait normal Psychiatric: normal affect, behavior normal, pleasant . MMSE 30/30 Breast:   breasts appear normal, no suspicious masses, no skin or nipple changes or axillary nodes. Skin: Warm and dry. Lipoma on right upper back remains stable Gyn: defer  Rectal: defer EKG: NSR, no ST changes AAA: < 3 cm  Alycia Rossetti, NP   02/17/2022

## 2022-02-17 ENCOUNTER — Ambulatory Visit (INDEPENDENT_AMBULATORY_CARE_PROVIDER_SITE_OTHER): Payer: Medicare Other | Admitting: Nurse Practitioner

## 2022-02-17 ENCOUNTER — Encounter: Payer: Self-pay | Admitting: Nurse Practitioner

## 2022-02-17 VITALS — BP 118/82 | HR 85 | Temp 97.9°F | Ht 64.5 in | Wt 139.6 lb

## 2022-02-17 DIAGNOSIS — Z136 Encounter for screening for cardiovascular disorders: Secondary | ICD-10-CM

## 2022-02-17 DIAGNOSIS — G8929 Other chronic pain: Secondary | ICD-10-CM

## 2022-02-17 DIAGNOSIS — Z1329 Encounter for screening for other suspected endocrine disorder: Secondary | ICD-10-CM

## 2022-02-17 DIAGNOSIS — Z0000 Encounter for general adult medical examination without abnormal findings: Secondary | ICD-10-CM

## 2022-02-17 DIAGNOSIS — Z9889 Other specified postprocedural states: Secondary | ICD-10-CM

## 2022-02-17 DIAGNOSIS — Z0001 Encounter for general adult medical examination with abnormal findings: Secondary | ICD-10-CM

## 2022-02-17 DIAGNOSIS — F320 Major depressive disorder, single episode, mild: Secondary | ICD-10-CM

## 2022-02-17 DIAGNOSIS — Z79899 Other long term (current) drug therapy: Secondary | ICD-10-CM | POA: Diagnosis not present

## 2022-02-17 DIAGNOSIS — E1169 Type 2 diabetes mellitus with other specified complication: Secondary | ICD-10-CM | POA: Diagnosis not present

## 2022-02-17 DIAGNOSIS — E1122 Type 2 diabetes mellitus with diabetic chronic kidney disease: Secondary | ICD-10-CM

## 2022-02-17 DIAGNOSIS — I10 Essential (primary) hypertension: Secondary | ICD-10-CM

## 2022-02-17 DIAGNOSIS — G4700 Insomnia, unspecified: Secondary | ICD-10-CM

## 2022-02-17 DIAGNOSIS — E559 Vitamin D deficiency, unspecified: Secondary | ICD-10-CM | POA: Diagnosis not present

## 2022-02-17 DIAGNOSIS — D508 Other iron deficiency anemias: Secondary | ICD-10-CM

## 2022-02-17 DIAGNOSIS — R0989 Other specified symptoms and signs involving the circulatory and respiratory systems: Secondary | ICD-10-CM

## 2022-02-17 DIAGNOSIS — F419 Anxiety disorder, unspecified: Secondary | ICD-10-CM

## 2022-02-17 DIAGNOSIS — M810 Age-related osteoporosis without current pathological fracture: Secondary | ICD-10-CM

## 2022-02-17 DIAGNOSIS — I700 Atherosclerosis of aorta: Secondary | ICD-10-CM

## 2022-02-17 DIAGNOSIS — N182 Chronic kidney disease, stage 2 (mild): Secondary | ICD-10-CM | POA: Diagnosis not present

## 2022-02-17 MED ORDER — FOLIVANE-F 125-1 MG PO CAPS: ORAL_CAPSULE | ORAL | 1 refills | Status: DC

## 2022-02-17 NOTE — Patient Instructions (Signed)
Very important to take Metformin daily with breakfast  Very important to take Rosuvastatin 5 mg every day with breakfast    Please make sure you continue to decrease bad carbs like white bread, white rice, potatoes, corn, soft drinks, pasta, cereals, refined sugars, sweet tea, dried fruits, and fruit juice. Try to focus your diet on whole foods, focusing on fresh fruits and vegetables, beans, healthy fats (avocado, nuts, olives, seeds), and fish or lean protein if you choose to consume these in moderation. Good carbs are okay to eat in moderation like sweet potatoes, brown rice, old fashioned oats and whole grain (high fiber) pasta/bread. Exercise is also very helpful, as is adequate water intake - aim for 30 min of brisk walking daily, and 65-80+ fluid ounces of water daily.      Most cholesterol and saturated fat comes from animal sources - meat, egg yolks, high fat dairy- so recommend limiting your intake of these, if you consume, try to use leanest option possible in small portions. It's also very important to be conscious of highly processed foods - chips, pastries, oils, etc. Anything that comes with a label should indicate saturated fat content and continue to reduce this until LDL numbers are improved.  It's also important to increase the good things - particularly soluble fiber. Focus diet on whole foods, eating mostly plant-based items - fruit/vegetables, beans, nuts/seeds (chia, ground flax, etc), avocado/avocado oil, olives/olive oil, and whole grains (old fashioned oats, quinoa, etc) in moderation. If not already doing so, you could also add a daily soluble fiber supplement such as citrucel or benefiber.

## 2022-02-18 LAB — CBC WITH DIFFERENTIAL/PLATELET
Absolute Monocytes: 390 cells/uL (ref 200–950)
Basophils Absolute: 19 cells/uL (ref 0–200)
Basophils Relative: 0.4 %
Eosinophils Absolute: 0 cells/uL — ABNORMAL LOW (ref 15–500)
Eosinophils Relative: 0 %
HCT: 39.7 % (ref 35.0–45.0)
Hemoglobin: 13.1 g/dL (ref 11.7–15.5)
Lymphs Abs: 1095 cells/uL (ref 850–3900)
MCH: 27.2 pg (ref 27.0–33.0)
MCHC: 33 g/dL (ref 32.0–36.0)
MCV: 82.5 fL (ref 80.0–100.0)
MPV: 10.7 fL (ref 7.5–12.5)
Monocytes Relative: 8.3 %
Neutro Abs: 3196 cells/uL (ref 1500–7800)
Neutrophils Relative %: 68 %
Platelets: 329 10*3/uL (ref 140–400)
RBC: 4.81 10*6/uL (ref 3.80–5.10)
RDW: 12.4 % (ref 11.0–15.0)
Total Lymphocyte: 23.3 %
WBC: 4.7 10*3/uL (ref 3.8–10.8)

## 2022-02-18 LAB — LIPID PANEL
Cholesterol: 239 mg/dL — ABNORMAL HIGH (ref <200)
HDL: 62 mg/dL (ref >=50)
LDL Cholesterol (Calc): 142 mg/dL (calc) — ABNORMAL HIGH
Non-HDL Cholesterol (Calc): 177 mg/dL (calc) — ABNORMAL HIGH (ref <130)
Total CHOL/HDL Ratio: 3.9 (calc) (ref <5.0)
Triglycerides: 211 mg/dL — ABNORMAL HIGH (ref <150)

## 2022-02-18 LAB — MICROALBUMIN / CREATININE URINE RATIO
Creatinine, Urine: 136 mg/dL (ref 20–275)
Microalb Creat Ratio: 9 mcg/mg creat (ref <30)
Microalb, Ur: 1.2 mg/dL

## 2022-02-18 LAB — URINALYSIS W MICROSCOPIC + REFLEX CULTURE
Bacteria, UA: NONE SEEN /HPF
Bilirubin Urine: NEGATIVE
Glucose, UA: NEGATIVE
Hgb urine dipstick: NEGATIVE
Ketones, ur: NEGATIVE
Leukocyte Esterase: NEGATIVE
Nitrites, Initial: NEGATIVE
Protein, ur: NEGATIVE
RBC / HPF: NONE SEEN /HPF (ref 0–2)
Specific Gravity, Urine: 1.018 (ref 1.001–1.035)
pH: 5.5 (ref 5.0–8.0)

## 2022-02-18 LAB — COMPLETE METABOLIC PANEL WITH GFR
AG Ratio: 1.6 (calc) (ref 1.0–2.5)
ALT: 9 U/L (ref 6–29)
AST: 12 U/L (ref 10–35)
Albumin: 4.3 g/dL (ref 3.6–5.1)
Alkaline phosphatase (APISO): 81 U/L (ref 37–153)
BUN: 14 mg/dL (ref 7–25)
CO2: 29 mmol/L (ref 20–32)
Calcium: 9.4 mg/dL (ref 8.6–10.4)
Chloride: 105 mmol/L (ref 98–110)
Creat: 0.6 mg/dL (ref 0.60–0.95)
Globulin: 2.7 g/dL (calc) (ref 1.9–3.7)
Glucose, Bld: 140 mg/dL — ABNORMAL HIGH (ref 65–99)
Potassium: 3.9 mmol/L (ref 3.5–5.3)
Sodium: 142 mmol/L (ref 135–146)
Total Bilirubin: 0.3 mg/dL (ref 0.2–1.2)
Total Protein: 7 g/dL (ref 6.1–8.1)
eGFR: 88 mL/min/{1.73_m2} (ref >=60)

## 2022-02-18 LAB — HEMOGLOBIN A1C
Hgb A1c MFr Bld: 6.6 % of total Hgb — ABNORMAL HIGH (ref <5.7)
Mean Plasma Glucose: 143 mg/dL
eAG (mmol/L): 7.9 mmol/L

## 2022-02-18 LAB — VITAMIN D 25 HYDROXY (VIT D DEFICIENCY, FRACTURES): Vit D, 25-Hydroxy: 41 ng/mL (ref 30–100)

## 2022-02-18 LAB — MAGNESIUM: Magnesium: 1.9 mg/dL (ref 1.5–2.5)

## 2022-02-18 LAB — TSH: TSH: 1.9 mIU/L (ref 0.40–4.50)

## 2022-02-18 LAB — NO CULTURE INDICATED

## 2022-03-08 ENCOUNTER — Other Ambulatory Visit: Payer: Self-pay | Admitting: Nurse Practitioner

## 2022-03-08 DIAGNOSIS — D508 Other iron deficiency anemias: Secondary | ICD-10-CM

## 2022-03-09 ENCOUNTER — Other Ambulatory Visit: Payer: Self-pay | Admitting: Nurse Practitioner

## 2022-03-09 NOTE — Telephone Encounter (Signed)
Pt called today about [facility] not accepting coupon for :  Fe Fum-FePoly-FA-Vit C-Vit B3 (FOLIVANE-F) 125-1 MG CAPS   And it would be a $70 out-of-pocket cost. The advice that [facility] gave me yesterday when they called was to direct her to check pharmacies in her area that accept the coupon and transfer the prescription there. I tried to explain that to her but she said we didn't want to help her. I tried to explain that all she has to do is look for another pharmacy other than [facility] that would accept the coupon and she can continue getting her medication at the usual price she has been paying. Could you call her and maybe see where the confusion is or suggest something else she can take?

## 2022-03-09 NOTE — Telephone Encounter (Signed)
Spoke with patient. She is going to try a different pharmacy to see if they will take the coupon. Also gave her the recommendation for the over the counter one and patient wrote it down.

## 2022-03-09 NOTE — Telephone Encounter (Signed)
Please call the patient and suggest she get Ferrous Sulfate 325 mg daily from over the counter- should not be expensive. She is unable to have [place] - F as [facility] will not use the discount card

## 2022-03-10 NOTE — Telephone Encounter (Signed)
Pt is returning pam's call about a different pharmacy. She is requesting it be sent to [facility] on [address]

## 2022-03-15 ENCOUNTER — Telehealth: Payer: Self-pay | Admitting: Nurse Practitioner

## 2022-03-15 ENCOUNTER — Other Ambulatory Visit: Payer: Self-pay | Admitting: Nurse Practitioner

## 2022-03-15 ENCOUNTER — Telehealth: Payer: Self-pay

## 2022-03-15 DIAGNOSIS — D508 Other iron deficiency anemias: Secondary | ICD-10-CM

## 2022-03-15 MED ORDER — FOLIVANE-F 125-1 MG PO CAPS: ORAL_CAPSULE | ORAL | 1 refills | Status: DC

## 2022-03-15 NOTE — Telephone Encounter (Signed)
Patient states that she has been out of [place] F for a couple weeks. She says that [facility] doesn't honor the coupon she used in the past and it's $60.00. She wants to know if you can send the rx to [facility] at [place]? Patient would like a call from the nurse so that way she is aware.

## 2022-03-15 NOTE — Telephone Encounter (Signed)
LM for patient to call back.

## 2022-03-15 NOTE — Telephone Encounter (Signed)
Patient aware prescription has been sent to [facility] and that a prior Josem Kaufmann has been completed.

## 2022-03-15 NOTE — Telephone Encounter (Signed)
Prior auth completed and submitted.

## 2022-03-15 NOTE — Telephone Encounter (Signed)
I sent the prescription to [facility]

## 2022-03-24 NOTE — Telephone Encounter (Signed)
Prior auth denied. 

## 2022-05-25 NOTE — Progress Notes (Unsigned)
MEDICARE ANNUAL WELLNESS VISIT AND 3 MONTH FOLLOW  Assessment:   Stephanie Roach was seen today for annual exam.  Diagnoses and all orders for this visit:  Encounter for medicare annual wellness visit Due Annually She has an upcoming appointment with Dr Dione Booze Mammogram 10/29/21 Behind on DEXA- last 2017 showed osteoporosis T score - 3.0, scheduled for 08/02/22  Current mild episode of major depressive disorder, unspecified whether recurrent (HCC) Continue Effexor and Wellbutrin,  Continue diet, exercise, relaxation techniques and practicing good sleep hygiene  Labile hypertension Currently controlled without medication -     CBC with Differential/Platelet   Hyperlipidemia associate with Type 2 Diabetes Mellitus Continue diet and exercise Have prescribed Rosuvastatin 5 mg but pt has not taken Discussed possible need for statin if remains elevated -     COMPLETE METABOLIC PANEL WITH GFR -     Lipid panel -     TSH  CKD stage 2 associated with Type 2 Diabetes mellitus(HCC) Increase fluids, avoid NSAIDS, monitor sugars, will monitor  Chronic pain of both knees Continue exercise, follow up with ortho as needed  Protein malnutrition(HCC) Encouraged to increase protein and high nutrient dense foods  Chronic anxiety Continue Effexor and  Wellbutrin with  use of Ativan(prescribed by Dr. Donell Beers) Monitor symptoms, encourage behavior modifications exercise and good sleep hygiene  Type 2 Diabetes Mellitus with Stage 2 CKD Currently is not taking any medications- was prescribed Metformin in past but did not take Continue diet and exercise.  Perform daily foot/skin check, notify office of any concerning changes.  Diabetes foot exam today Check A1C   Vitamin D deficiency Continue Vit D supplementation to maintain value in therapeutic level of 60-100   Medication management -TSH  Iron deficiency anemia, unspecified iron deficiency anemia type Continue to monitor and stressed healthy  diet -     CBC with Differential/Platelet   Osteoporosis, unspecified osteoporosis type, unspecified pathological fracture presence Continue Calcium and Vit D3 , and weight bearing exercises DEXA ordered, scheduled for 7/98/24  Hx of adenomatous colonic polyps Monitor for symptoms, has aged out of colonoscopy  Noncompliance of medications/ Noncompliance of Diabetes Treatment Stressed the importance of taking medications as prescribed Counseled on risks of untreated cholesterol and blood sugar    Future Appointments  Date Time Provider Department Center  08/02/2022 11:00 AM [facility] DEXA 1 [facility]  02/18/2023 10:00 AM Lucky Cowboy, MD [facility] None     Plan:   During the course of the visit the patient was educated and counseled about appropriate screening and preventive services including:   Pneumococcal vaccine  Prevnar 13 Influenza vaccine Td vaccine Screening electrocardiogram Bone densitometry screening Colorectal cancer screening Diabetes screening Glaucoma screening Nutrition counseling  Advanced directives: requested   Subjective:   Stephanie Roach is a 87 y.o. female who presents for CPE and follow up for chol, preDM, and HTN.   Overall she is very noncompliant with medications.  She does not take her cholesterol medication or Metformin.   Her blood pressure has been controlled at home without medications, today their BP is BP: 132/72  BP Readings from Last 3 Encounters:  05/26/22 132/72  02/17/22 118/82  10/28/21 120/78  She does not workout.  She denies chest pain,dizziness.   BMI is Body mass index is 22.17 kg/m., she has been working on diet and exercise. She has stopped eating sweets. She has lost 8 pounds in the past 4 months. She is wearing invisalign but not eating as much. 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 She is on integra and following with Dr. Arbutus Ped. Lab Results  Component Value Date   IRON 88 07/23/2021   TIBC 520 (H) 07/23/2021   FERRITIN 45 07/23/2021   Husband, Stephanie Roach is deceased.  Continues to have issues with depression/anxiety. She does have limited interactions with her friends. She is on Effexor 150 mg daily, Wellbutrin 150 mg QD and Ativan 1 mg as needed. Ativan 1 mg #90 was last filled 04/05/2022, previous fill was 03/05/22 of 90. The Ativan is being filled by Dr Donell Beers  She is not on cholesterol medication and denies myalgias. Was prescribed Rosuvastatin 5 mg but never took. Her cholesterol is not at goal. The cholesterol last visit was:  Reluctant to try medications, wants to try diet and exercise Lab Results  Component Value Date   CHOL 239 (H) 02/17/2022   HDL 62 02/17/2022   LDLCALC 142 (H) 02/17/2022   TRIG 211 (H) 02/17/2022   CHOLHDL 3.9 02/17/2022    She has been working on diet and exercise for prediabetes, and denies polydipsia, polyuria and visual disturbances. She has been counseled at length on Metformin and importance of checking blood sugars. Last A1C in the office was:  Lab Results  Component Value Date   HGBA1C 6.6 (H) 02/17/2022   Patient is on Vitamin D supplement, 2000 IU.   Lab Results  Component Value Date   VD25OH 41 02/17/2022     Saw Dr. Luiz Blare for bilateral knee pain, got injections that helped. Has not had an injection for awhile, pain persists continually. She is having continued knee pain and needs to have another visit for injections   Immunization History  Administered Date(s) Administered   Influenza, High Dose Seasonal PF 12/29/2018, 01/01/2021, 10/28/2021   PFIZER(Purple Top)SARS-COV-2 Vaccination 05/07/2019, 05/30/2019   Pneumococcal Conjugate-13 08/27/2013   Pneumococcal Polysaccharide-23 10/23/2015    Pneumococcal-Unspecified 12/06/2001   Tdap 08/05/2012   Zoster, Live 02/05/2010   Health Maintenance  Topic Date Due   OPHTHALMOLOGY EXAM  05/26/2022 (Originally 04/10/2022)   COVID-19 Vaccine (3 - Pfizer risk series) 06/11/2022 (Originally 06/27/2019)   Zoster Vaccines- Shingrix (1 of 2) 08/26/2022 (Originally 12/21/1954)   DTaP/Tdap/Td (2 - Td or Tdap) 08/06/2022   HEMOGLOBIN A1C  08/18/2022   INFLUENZA VACCINE  08/26/2022   Diabetic kidney evaluation - eGFR measurement  02/18/2023   Diabetic kidney evaluation - Urine ACR  02/18/2023   FOOT EXAM  05/26/2023   Medicare Annual Wellness (AWV)  05/26/2023   Pneumonia Vaccine 70+ Years old  Completed   DEXA SCAN  Completed   HPV VACCINES  Aged Out   COLONOSCOPY (Pts 45-33yrs Insurance coverage will need to be confirmed)  Discontinued    Mammogram: 10/2021 Negative Colonoscopy: 2015 , no follow up recommended DEXA: 2017 osteoporosis, on Vit D3, scheduled for 07/2022  Names of Other Physician/Practitioners you currently use: 1. [facility]- here for primary care 2. Dr. Dione Booze, eye doctor, 03/2021 3. [id number] 10/2021 Patient Care Team: Lucky Cowboy, MD as PCP - General (Internal Medicine) Hart Carwin, MD (Inactive) as Consulting Physician (Gastroenterology) Archer Asa, MD as Consulting Physician (Psychiatry) Si Gaul, MD as Consulting Physician (Oncology) Luiz Blare,  Sharlet Salina, MD as Referring Physician (Orthopedic Surgery) Cindee Salt, MD as Consulting Physician (Orthopedic Surgery)   Medication Review Current Outpatient Medications on File Prior to Visit  Medication Sig Dispense Refill   buPROPion (WELLBUTRIN XL) 150 MG 24 hr tablet Take 150 mg by mouth daily.     Cholecalciferol (VITAMIN D3) 125 MCG (5000 UT) CAPS Take by mouth.     Cyanocobalamin (VITAMIN B-12 PO) Take by mouth. Sometimes     Fe Fum-FePoly-FA-Vit C-Vit B3 (FOLIVANE-F) 125-1 MG CAPS Take 1 capsule three times a  day with meals. 180 capsule 1   LORazepam (ATIVAN) 1 MG tablet Take 1 mg by mouth 2 (two) times daily.     omeprazole (PRILOSEC) 20 MG capsule Take by mouth.     traZODone (DESYREL) 50 MG tablet Take  1 to 2 tablets  1 hour  before Bedtime as needed for Sleep 60 tablet 0   venlafaxine XR (EFFEXOR-XR) 150 MG 24 hr capsule Take 150 mg by mouth at bedtime.     Blood Glucose Monitoring Suppl DEVI Use to check blood sugar once daily or as directed. 1 each 0   glucose blood test strip Use to check blood sugar once daily or as directed. 100 each 12   hydrOXYzine (ATARAX) 25 MG tablet TAKE 1 TABLET BY MOUTH EVERYDAY AT BEDTIME (Patient not taking: Reported on 05/26/2022) 90 tablet 1   Lancets MISC Use to check blood sugar once daily or as directed. 200 each 12   No current facility-administered medications on file prior to visit.    Current Problems (verified) Patient Active Problem List   Diagnosis Date Noted   CKD stage 2 due to type 2 diabetes mellitus (HCC) 02/16/2022   Grief reaction 04/02/2020   S/P lumpectomy, right breast 09/17/2019   Medication management 02/18/2015   Hiatal hernia 02/18/2015   Abnormal glucose    Hyperlipidemia associated with type 2 diabetes mellitus (HCC)    Labile hypertension    Vitamin D deficiency    Hx of adenomatous colonic polyps 08/11/2010   Iron deficiency anemia 08/11/2010   Chronic anxiety 08/11/2010    Screening Tests Immunization History  Administered Date(s) Administered   Influenza, High Dose Seasonal PF 12/29/2018, 01/01/2021, 10/28/2021   PFIZER(Purple Top)SARS-COV-2 Vaccination 05/07/2019, 05/30/2019   Pneumococcal Conjugate-13 08/27/2013   Pneumococcal Polysaccharide-23 10/23/2015   Pneumococcal-Unspecified 12/06/2001   Tdap 08/05/2012   Zoster, Live 02/05/2010    Health Maintenance  Topic Date Due   OPHTHALMOLOGY EXAM  05/26/2022 (Originally 04/10/2022)   COVID-19 Vaccine (3 - Pfizer risk series) 06/11/2022 (Originally 06/27/2019)    Zoster Vaccines- Shingrix (1 of 2) 08/26/2022 (Originally 12/21/1954)   DTaP/Tdap/Td (2 - Td or Tdap) 08/06/2022   HEMOGLOBIN A1C  08/18/2022   INFLUENZA VACCINE  08/26/2022   Diabetic kidney evaluation - eGFR measurement  02/18/2023   Diabetic kidney evaluation - Urine ACR  02/18/2023   FOOT EXAM  05/26/2023   Medicare Annual Wellness (AWV)  05/26/2023   Pneumonia Vaccine 30+ Years old  Completed   DEXA SCAN  Completed   HPV VACCINES  Aged Out   COLONOSCOPY (Pts 45-108yrs Insurance coverage will need to be confirmed)  Discontinued     Allergies Allergies  Allergen Reactions   Morphine And Related     Unknown     SURGICAL HISTORY She  has a past surgical history that includes Cholecystectomy (2003); Knee arthroscopy; Bunionectomy; Wrist fracture surgery; Cataract extraction (Bilateral); Breast lumpectomy with radioactive seed localization (Right, 07/05/2019); Breast biopsy (  Right, 10/19/2018); and Breast excisional biopsy (Right, 07/05/2019). FAMILY HISTORY Her family history includes Breast cancer in her cousin; Colon cancer in her father; Colon polyps in her maternal aunt; Pancreatic cancer in her paternal aunt; Stroke in her mother; Ulcerative colitis in her brother. SOCIAL HISTORY She  reports that she has never smoked. She has never used smokeless tobacco. She reports that she does not drink alcohol and does not use drugs.  MEDICARE WELLNESS OBJECTIVES: Physical activity: Current Exercise Habits: The patient does not participate in regular exercise at present, Exercise limited by: orthopedic condition(s) Cardiac risk factors: Cardiac Risk Factors include: advanced age (>9men, >69 women);diabetes mellitus;dyslipidemia;sedentary lifestyle Depression/mood screen:      05/26/2022   11:20 AM  Depression screen PHQ 2/9  Decreased Interest 1  Down, Depressed, Hopeless 1  PHQ - 2 Score 2  Altered sleeping 1  Tired, decreased energy 1  Change in appetite 1  Feeling bad or failure  about yourself  1  Trouble concentrating 0  Moving slowly or fidgety/restless 0  Suicidal thoughts 0  PHQ-9 Score 6  Difficult doing work/chores Somewhat difficult    ADLs:     05/26/2022   11:21 AM 10/28/2021   12:04 PM  In your present state of health, do you have any difficulty performing the following activities:  Hearing? 0 0  Vision? 0 0  Difficulty concentrating or making decisions? 0 0  Walking or climbing stairs? 1 1  Dressing or bathing? 0 0  Doing errands, shopping? 0 0     Cognitive Testing  Alert? Yes  Normal Appearance?Yes  Oriented to person? Yes  Place? Yes   Time? Yes  Recall of three objects?  Yes  Can perform simple calculations? Yes  Displays appropriate judgment?Yes  Can read the correct time from a watch face?Yes  EOL planning: Does Patient Have a Medical Advance Directive?: Yes Type of Advance Directive: Living will, Healthcare Power of Attorney Does patient want to make changes to medical advance directive?: No - Patient declined Copy of Healthcare Power of Attorney in Chart?: No - copy requested     Review of Systems  Constitutional:  Positive for malaise/fatigue. Negative for chills, diaphoresis, fever and weight loss.  HENT: Negative.  Negative for congestion, hearing loss, sinus pain, sore throat and tinnitus.   Eyes: Negative.  Negative for blurred vision and double vision.  Respiratory: Negative.  Negative for cough, hemoptysis, sputum production, shortness of breath and wheezing.   Cardiovascular: Negative.  Negative for chest pain, palpitations and leg swelling.  Gastrointestinal:  Positive for heartburn (Tums). Negative for abdominal pain, constipation, diarrhea, nausea and vomiting.  Genitourinary: Negative.  Negative for dysuria and urgency.  Musculoskeletal:  Positive for joint pain (knees). Negative for back pain, falls, myalgias and neck pain.  Skin: Negative.  Negative for rash.  Neurological: Negative.  Negative for dizziness,  tingling, tremors, weakness and headaches.  Endo/Heme/Allergies:  Does not bruise/bleed easily.  Psychiatric/Behavioral:  Positive for depression. Negative for suicidal ideas. The patient has insomnia (bad dreams). The patient is not nervous/anxious.      Objective:     Blood pressure 132/72, pulse 96, temperature (!) 97.5 F (36.4 C), height 5' 4.5" (1.638 m), weight 131 lb 3.2 oz (59.5 kg), SpO2 95 %. Body mass index is 22.17 kg/m.  General appearance: alert, no distress, WD/WN,  female HEENT: normocephalic, sclerae anicteric, TMs pearly, nares patent, no discharge or erythema, pharynx normal Oral cavity: MMM, no lesions Neck: supple, no lymphadenopathy, no  thyromegaly, no masses Heart: RRR, normal S1, S2, no murmurs Lungs: CTA bilaterally, no wheezes, rhonchi, or rales Abdomen: +bs, soft, non tender, non distended, no masses, no hepatomegaly, no splenomegaly Musculoskeletal: nontender, no swelling, no obvious deformity.  Extremities: no edema, no cyanosis, no clubbing Pulses: 2+ symmetric, upper and lower extremities, normal cap refill Neurological: alert, oriented x 3, CN2-12 intact, strength normal upper extremities and lower extremities, sensation normal throughout, DTRs 2+ throughout, no cerebellar signs, gait normal Psychiatric: flat affect, behavior normal, pleasant      Medicare Attestation I have personally reviewed: The patient's medical and social history Their use of alcohol, tobacco or illicit drugs Their current medications and supplements The patient's functional ability including ADLs,fall risks, home safety risks, cognitive, and hearing and visual impairment Diet and physical activities Evidence for depression or mood disorders  The patient's weight, height, BMI, and visual acuity have been recorded in the chart.  I have made referrals, counseling, and provided education to the patient based on review of the above and I have provided the patient with a written  personalized care plan for preventive services.     Raynelle Dick, NP   05/26/2022

## 2022-05-26 ENCOUNTER — Encounter: Payer: Self-pay | Admitting: Nurse Practitioner

## 2022-05-26 ENCOUNTER — Ambulatory Visit (INDEPENDENT_AMBULATORY_CARE_PROVIDER_SITE_OTHER): Payer: Medicare Other | Admitting: Nurse Practitioner

## 2022-05-26 VITALS — BP 132/72 | HR 96 | Temp 97.5°F | Ht 64.5 in | Wt 131.2 lb

## 2022-05-26 DIAGNOSIS — E1122 Type 2 diabetes mellitus with diabetic chronic kidney disease: Secondary | ICD-10-CM

## 2022-05-26 DIAGNOSIS — D509 Iron deficiency anemia, unspecified: Secondary | ICD-10-CM | POA: Diagnosis not present

## 2022-05-26 DIAGNOSIS — M25562 Pain in left knee: Secondary | ICD-10-CM

## 2022-05-26 DIAGNOSIS — E559 Vitamin D deficiency, unspecified: Secondary | ICD-10-CM | POA: Diagnosis not present

## 2022-05-26 DIAGNOSIS — M25561 Pain in right knee: Secondary | ICD-10-CM

## 2022-05-26 DIAGNOSIS — E46 Unspecified protein-calorie malnutrition: Secondary | ICD-10-CM

## 2022-05-26 DIAGNOSIS — M810 Age-related osteoporosis without current pathological fracture: Secondary | ICD-10-CM | POA: Diagnosis not present

## 2022-05-26 DIAGNOSIS — Z86010 Personal history of colonic polyps: Secondary | ICD-10-CM

## 2022-05-26 DIAGNOSIS — Z0000 Encounter for general adult medical examination without abnormal findings: Secondary | ICD-10-CM

## 2022-05-26 DIAGNOSIS — N182 Chronic kidney disease, stage 2 (mild): Secondary | ICD-10-CM | POA: Diagnosis not present

## 2022-05-26 DIAGNOSIS — Z91148 Patient's other noncompliance with medication regimen for other reason: Secondary | ICD-10-CM

## 2022-05-26 DIAGNOSIS — F320 Major depressive disorder, single episode, mild: Secondary | ICD-10-CM

## 2022-05-26 DIAGNOSIS — E1169 Type 2 diabetes mellitus with other specified complication: Secondary | ICD-10-CM

## 2022-05-26 DIAGNOSIS — R0989 Other specified symptoms and signs involving the circulatory and respiratory systems: Secondary | ICD-10-CM | POA: Diagnosis not present

## 2022-05-26 DIAGNOSIS — Z79899 Other long term (current) drug therapy: Secondary | ICD-10-CM

## 2022-05-26 DIAGNOSIS — R6889 Other general symptoms and signs: Secondary | ICD-10-CM | POA: Diagnosis not present

## 2022-05-26 DIAGNOSIS — F419 Anxiety disorder, unspecified: Secondary | ICD-10-CM

## 2022-05-26 DIAGNOSIS — E785 Hyperlipidemia, unspecified: Secondary | ICD-10-CM | POA: Diagnosis not present

## 2022-05-26 DIAGNOSIS — G8929 Other chronic pain: Secondary | ICD-10-CM

## 2022-05-26 DIAGNOSIS — Z0001 Encounter for general adult medical examination with abnormal findings: Secondary | ICD-10-CM

## 2022-05-26 DIAGNOSIS — G4700 Insomnia, unspecified: Secondary | ICD-10-CM | POA: Diagnosis not present

## 2022-05-26 DIAGNOSIS — Z91199 Patient's noncompliance with other medical treatment and regimen due to unspecified reason: Secondary | ICD-10-CM

## 2022-05-26 MED ORDER — METFORMIN HCL 500 MG PO TABS
500.0000 mg | ORAL_TABLET | Freq: Every day | ORAL | 11 refills | Status: DC
Start: 2022-05-26 — End: 2023-12-10

## 2022-05-26 MED ORDER — ROSUVASTATIN CALCIUM 5 MG PO TABS
5.0000 mg | ORAL_TABLET | Freq: Every day | ORAL | 11 refills | Status: DC
Start: 2022-05-26 — End: 2023-04-13

## 2022-05-26 NOTE — Patient Instructions (Addendum)
Please take Rosuvastatin 5 mg daily for your cholesterol  Please take Metformin 1 tab with biggest meal daily  Protein-Energy Malnutrition Protein-energy malnutrition is when a person does not eat enough protein, fat, and calories. When this happens over time, it can lead to severe loss of muscle tissue (muscle wasting). This condition also affects the body's defense system (immune system) and can lead to other health problems. What are the causes? This condition may be caused by: Not eating enough protein, fat, or calories. Having certain chronic medical conditions. Eating too little. What increases the risk? The following factors may make you more likely to develop this condition: Living in poverty. Long-term hospitalization. Alcohol or drug dependency. Addiction often leads to a lifestyle in which proper diet is ignored. Dependency can also hurt the metabolism and the body's ability to absorb nutrients. Eating disorders, such as anorexia nervosa or bulimia. Chewing or swallowing problems. People with these disorders may not eat enough. Having certain conditions, such as: Inflammatory bowel disease. Inflammation of the intestines makes it difficult for the body to absorb nutrients. Cancer or AIDS. These diseases can cause a loss of appetite. Chronic heart failure. This interferes with how the body uses nutrients. Cystic fibrosis. This disease can make it difficult for the body to absorb nutrients. Eating a diet that extremely restricts protein, fat, or calorie intake. What are the signs or symptoms? Symptoms of this condition include: Tiredness (fatigue). Weakness. Dizziness. Fainting. Weight loss. Loss of muscle tone and muscle mass. Poor immune response. Lack of menstruation. Poor memory. Hair loss. Skin changes. How is this diagnosed? This condition may be diagnosed based on: Your medical and dietary history. A physical exam. This may include a measurement of your body  mass index. Blood tests. How is this treated? This condition may be managed with: Nutrition therapy. This may include working with a Data processing manager. Treatment for underlying conditions. People with severe protein-energy malnutrition may need to be treated in a hospital. This may involve receiving nutrition and fluids through an IV. Follow these instructions at home:  Eat a balanced diet. In each meal, include at least one food that is high in protein. Foods that are high in protein include: Meat. Poultry. Fish. Eggs. Cheese. Milk. Beans. Nuts. Eat nutrient-rich foods that are easy to swallow and digest, such as: Fruit and yogurt smoothies. Oatmeal with nut butter. Nutrition supplement drinks. Try to eat six small meals each day instead of three large meals. Take vitamin and protein supplements as told by your health care provider or dietitian. Follow your health care provider's recommendations about exercise and activity. Keep all follow-up visits. This is important. Contact a health care provider if: You have increased weakness or fatigue. You faint. You are a woman and you stop having your period (menstruating). You have rapid hair loss. You have unexpected weight loss. You have diarrhea. You have nausea and vomiting. Get help right away if: You have difficulty breathing. You have chest pain. These symptoms may represent a serious problem that is an emergency. Do not wait to see if the symptoms will go away. Get medical help right away. Call your local emergency services (911 in the [place] Do not drive yourself to the hospital. Summary Protein-energy malnutrition is when a person does not eat enough protein, fat, and calories. Protein-energy malnutrition can lead to severe loss of muscle tissue (muscle wasting). This condition also affects the body's defense system (immune system) and can lead to other health problems. Talk with your health care  provider about treatment for this  condition. Effective treatment depends on the underlying cause of the malnutrition. This information is not intended to replace advice given to you by your health care provider. Make sure you discuss any questions you have with your health care provider. Document Revised: 01/12/2020 Document Reviewed: 01/12/2020 Elsevier Patient Education  2023 [employer].

## 2022-05-27 LAB — COMPLETE METABOLIC PANEL WITH GFR
AG Ratio: 1.7 (calc) (ref 1.0–2.5)
ALT: 10 U/L (ref 6–29)
AST: 13 U/L (ref 10–35)
Albumin: 4.5 g/dL (ref 3.6–5.1)
Alkaline phosphatase (APISO): 86 U/L (ref 37–153)
BUN: 12 mg/dL (ref 7–25)
CO2: 27 mmol/L (ref 20–32)
Calcium: 9.6 mg/dL (ref 8.6–10.4)
Chloride: 103 mmol/L (ref 98–110)
Creat: 0.62 mg/dL (ref 0.60–0.95)
Globulin: 2.6 g/dL (calc) (ref 1.9–3.7)
Glucose, Bld: 155 mg/dL — ABNORMAL HIGH (ref 65–99)
Potassium: 4.3 mmol/L (ref 3.5–5.3)
Sodium: 142 mmol/L (ref 135–146)
Total Bilirubin: 0.4 mg/dL (ref 0.2–1.2)
Total Protein: 7.1 g/dL (ref 6.1–8.1)
eGFR: 88 mL/min/{1.73_m2} (ref >=60)

## 2022-05-27 LAB — CBC WITH DIFFERENTIAL/PLATELET
Absolute Monocytes: 369 cells/uL (ref 200–950)
Basophils Absolute: 31 cells/uL (ref 0–200)
Basophils Relative: 0.6 %
Eosinophils Absolute: 0 cells/uL — ABNORMAL LOW (ref 15–500)
Eosinophils Relative: 0 %
HCT: 43 % (ref 35.0–45.0)
Hemoglobin: 13.9 g/dL (ref 11.7–15.5)
Lymphs Abs: 962 cells/uL (ref 850–3900)
MCH: 25.8 pg — ABNORMAL LOW (ref 27.0–33.0)
MCHC: 32.3 g/dL (ref 32.0–36.0)
MCV: 79.9 fL — ABNORMAL LOW (ref 80.0–100.0)
MPV: 10.8 fL (ref 7.5–12.5)
Monocytes Relative: 7.1 %
Neutro Abs: 3838 cells/uL (ref 1500–7800)
Neutrophils Relative %: 73.8 %
Platelets: 382 10*3/uL (ref 140–400)
RBC: 5.38 10*6/uL — ABNORMAL HIGH (ref 3.80–5.10)
RDW: 14.1 % (ref 11.0–15.0)
Total Lymphocyte: 18.5 %
WBC: 5.2 10*3/uL (ref 3.8–10.8)

## 2022-05-27 LAB — HEMOGLOBIN A1C
Hgb A1c MFr Bld: 6.9 % of total Hgb — ABNORMAL HIGH (ref <5.7)
Mean Plasma Glucose: 151 mg/dL
eAG (mmol/L): 8.4 mmol/L

## 2022-05-27 LAB — TSH: TSH: 1.96 mIU/L (ref 0.40–4.50)

## 2022-05-27 LAB — LIPID PANEL
Cholesterol: 265 mg/dL — ABNORMAL HIGH (ref <200)
HDL: 68 mg/dL (ref >=50)
LDL Cholesterol (Calc): 156 mg/dL (calc) — ABNORMAL HIGH
Non-HDL Cholesterol (Calc): 197 mg/dL (calc) — ABNORMAL HIGH (ref <130)
Total CHOL/HDL Ratio: 3.9 (calc) (ref <5.0)
Triglycerides: 252 mg/dL — ABNORMAL HIGH (ref <150)

## 2022-05-31 ENCOUNTER — Telehealth: Payer: Self-pay | Admitting: Nurse Practitioner

## 2022-05-31 ENCOUNTER — Other Ambulatory Visit: Payer: Self-pay | Admitting: Nurse Practitioner

## 2022-05-31 DIAGNOSIS — E1122 Type 2 diabetes mellitus with diabetic chronic kidney disease: Secondary | ICD-10-CM

## 2022-05-31 MED ORDER — BLOOD GLUCOSE MONITORING SUPPL DEVI
0 refills | Status: DC
Start: 2022-05-31 — End: 2022-06-01

## 2022-05-31 NOTE — Telephone Encounter (Signed)
Patient states that she has lost/misplaced her whole blood glucose testing kit. Can you re-send another kit to [facility] in [employer] on [address]?

## 2022-06-01 ENCOUNTER — Other Ambulatory Visit: Payer: Self-pay | Admitting: Nurse Practitioner

## 2022-06-01 ENCOUNTER — Other Ambulatory Visit: Payer: Self-pay

## 2022-06-01 DIAGNOSIS — E1122 Type 2 diabetes mellitus with diabetic chronic kidney disease: Secondary | ICD-10-CM

## 2022-06-01 MED ORDER — BLOOD GLUCOSE MONITORING SUPPL DEVI
0 refills | Status: DC
Start: 2022-06-01 — End: 2022-06-01

## 2022-06-01 MED ORDER — BLOOD GLUCOSE MONITORING SUPPL DEVI
0 refills | Status: DC
Start: 2022-06-01 — End: 2022-07-16

## 2022-06-01 MED ORDER — GLUCOSE BLOOD VI STRP
ORAL_STRIP | 12 refills | Status: DC
Start: 2022-06-01 — End: 2022-06-29

## 2022-06-01 MED ORDER — LANCETS MISC
12 refills | Status: DC
Start: 2022-06-01 — End: 2022-06-29

## 2022-06-01 NOTE — Telephone Encounter (Signed)
Can you get this done

## 2022-06-03 DIAGNOSIS — H5310 Unspecified subjective visual disturbances: Secondary | ICD-10-CM | POA: Diagnosis not present

## 2022-06-03 DIAGNOSIS — Z961 Presence of intraocular lens: Secondary | ICD-10-CM | POA: Diagnosis not present

## 2022-06-03 DIAGNOSIS — H04123 Dry eye syndrome of bilateral lacrimal glands: Secondary | ICD-10-CM | POA: Diagnosis not present

## 2022-06-03 DIAGNOSIS — H1013 Acute atopic conjunctivitis, bilateral: Secondary | ICD-10-CM | POA: Diagnosis not present

## 2022-06-29 ENCOUNTER — Other Ambulatory Visit: Payer: Self-pay

## 2022-06-29 DIAGNOSIS — N182 Chronic kidney disease, stage 2 (mild): Secondary | ICD-10-CM

## 2022-06-29 MED ORDER — GLUCOSE BLOOD VI STRP
ORAL_STRIP | 12 refills | Status: DC
Start: 2022-06-29 — End: 2023-12-10

## 2022-06-29 MED ORDER — LANCETS MISC
12 refills | Status: DC
Start: 2022-06-29 — End: 2023-12-10

## 2022-07-15 ENCOUNTER — Other Ambulatory Visit: Payer: Self-pay | Admitting: Nurse Practitioner

## 2022-07-15 DIAGNOSIS — E1122 Type 2 diabetes mellitus with diabetic chronic kidney disease: Secondary | ICD-10-CM

## 2022-08-02 ENCOUNTER — Inpatient Hospital Stay: Admission: RE | Admit: 2022-08-02 | Payer: Medicare Other | Source: Ambulatory Visit

## 2022-08-02 DIAGNOSIS — N189 Chronic kidney disease, unspecified: Secondary | ICD-10-CM | POA: Diagnosis not present

## 2022-08-02 DIAGNOSIS — M810 Age-related osteoporosis without current pathological fracture: Secondary | ICD-10-CM

## 2022-08-02 DIAGNOSIS — M8588 Other specified disorders of bone density and structure, other site: Secondary | ICD-10-CM | POA: Diagnosis not present

## 2022-08-02 DIAGNOSIS — E349 Endocrine disorder, unspecified: Secondary | ICD-10-CM | POA: Diagnosis not present

## 2022-09-06 NOTE — Progress Notes (Unsigned)
3 MONTH FOLLOW  Assessment:   Stephanie Roach was seen today for annual exam.  Diagnoses and all orders for this visit:   Current mild episode of major depressive disorder, unspecified whether recurrent (HCC) Continue Effexor and Wellbutrin, Behavior modifications of diet, exercise and Good sleep hygiene  Labile hypertension Currently controlled without medication -     CBC with Differential/Platelet -     EKG 12-Lead -     Urinalysis, Routine w reflex microscopic -     Microalbumin / creatinine urine ratio  Hyperlipidemia associate with Type 2 Diabetes Mellitus Continue diet and exercise Discussed possible need for statin if remains elevated -     COMPLETE METABOLIC PANEL WITH GFR -     Lipid panel -     TSH   CKD stage 2 associated with Type 2 Diabetes mellitus(HCC) Increase fluids, avoid NSAIDS, monitor sugars, will monitor  Chronic pain of both knees Continue exercise, follow up with ortho as needed  Chronic anxiety Continue Effexor and encouraged to restart Wellbutrin Monitor symptoms, encourage behavior modifications exercise and good sleep hygiene  Type 2 Diabetes Mellitus with Stage 2 CKD Continue medications: Continue diet and exercise.  Perform daily foot/skin check, notify office of any concerning changes.  Check A1C   Vitamin D deficiency -     VITAMIN D 25 Hydroxy (Vit-D Deficiency, Fractures)  Medication management -     Magnesium - CBC - CMP - TSH        Future Appointments  Date Time Provider Department Center  09/07/2022 11:00 AM Raynelle Dick, NP [facility] None  02/18/2023 10:00 AM Lucky Cowboy, MD [facility] None     Plan:   During the course of the visit the patient was educated and counseled about appropriate screening and preventive services including:   Pneumococcal vaccine  Prevnar 13 Influenza vaccine Td vaccine Screening electrocardiogram Bone densitometry screening Colorectal cancer screening Diabetes  screening Glaucoma screening Nutrition counseling  Advanced directives: requested   Subjective:   Stephanie Roach is a 87 y.o. female who presents for CPE and follow up for chol, preDM, and HTN.   She is having difficulties with her grandson who is in jail.  He is in jail for pulling a gun on his brother. She worries a lot about him.    Her blood pressure has been controlled at home, today their BP is    BP Readings from Last 3 Encounters:  05/26/22 132/72  02/17/22 118/82  10/28/21 120/78  She does not workout.  She denies chest pain,dizziness.   BMI is There is no height or weight on file to calculate BMI., she has been working on diet and exercise. She has stopped eating sweets.  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 She is on integra and following with Dr. Arbutus Ped. Lab Results  Component Value Date   IRON 88 07/23/2021   TIBC 520 (H) 07/23/2021   FERRITIN 45 07/23/2021   Husband, Leonette Most is deceased.  Continues to have issues with depression/anxiety. She does have limited interactions with her friends. She is on Effexor 150 mg daily, Wellbutrin 150 mg QD and Ativan 1 mg as needed.   She is not on cholesterol medication and denies myalgias.  Her cholesterol is not at goal. The cholesterol last visit was:  Reluctant to try medications, wants to try diet and exercise Lab Results  Component Value Date   CHOL 265 (H) 05/26/2022   HDL 68 05/26/2022   LDLCALC 156 (H) 05/26/2022   TRIG 252 (H) 05/26/2022   CHOLHDL 3.9 05/26/2022    She has been working on diet and exercise for prediabetes, and denies polydipsia, polyuria and visual disturbances. Last A1C in the office was:  Lab Results  Component Value Date   HGBA1C 6.9 (H) 05/26/2022   Patient is on  Vitamin D supplement, 2000 IU.   Lab Results  Component Value Date   VD25OH 41 02/17/2022     Saw Dr. Luiz Blare for bilateral knee pain, got injections that helped. Has not had an injection for awhile, pain persists continually. She is having continued knee pain and needs to have another visit for injections   Immunization History  Administered Date(s) Administered   Influenza, High Dose Seasonal PF 12/29/2018, 01/01/2021, 10/28/2021   PFIZER(Purple Top)SARS-COV-2 Vaccination 05/07/2019, 05/30/2019   Pneumococcal Conjugate-13 08/27/2013   Pneumococcal Polysaccharide-23 10/23/2015   Pneumococcal-Unspecified 12/06/2001   Tdap 08/05/2012   Zoster, Live 02/05/2010   Health Maintenance  Topic Date Due   Zoster Vaccines- Shingrix (1 of 2) 12/21/1954   COVID-19 Vaccine (3 - Pfizer risk series) 06/27/2019   OPHTHALMOLOGY EXAM  04/10/2022   DTaP/Tdap/Td (2 - Td or Tdap) 08/06/2022   INFLUENZA VACCINE  08/26/2022   HEMOGLOBIN A1C  11/26/2022   Diabetic kidney evaluation - Urine ACR  02/18/2023   Diabetic kidney evaluation - eGFR measurement  05/26/2023   FOOT EXAM  05/26/2023   Medicare Annual Wellness (AWV)  05/26/2023   Pneumonia Vaccine 82+ Years old  Completed   DEXA SCAN  Completed   HPV VACCINES  Aged Out   Colonoscopy  Discontinued    Mammogram: 10/27/20 Negative Colonoscopy: 2015 , no follow up recommended DEXA: 2017 osteoporosis, on Vit D3  Names of Other Physician/Practitioners you currently use: 1. [facility]- here for primary care 2. Dr. Dione Booze, eye doctor, 03/2021 3. Dr.SaRaf 06/2021 Patient Care Team: Lucky Cowboy, MD as PCP - General (Internal Medicine) Hart Carwin, MD (Inactive) as Consulting Physician (Gastroenterology) Archer Asa, MD as Consulting Physician (Psychiatry) Si Gaul, MD as Consulting Physician (Oncology) Sanjuana Letters, MD as Referring Physician (Orthopedic Surgery) Cindee Salt, MD as  Consulting Physician (Orthopedic Surgery)   Medication Review Current Outpatient Medications on File Prior to Visit  Medication Sig Dispense Refill   Blood Glucose Monitoring Suppl (ACCU-CHEK GUIDE ME) w/Device KIT USE TO CHECK BLOOD SUGAR ONCE DAILY OR AS DIRECTED. 1 kit 0   Blood Glucose Monitoring Suppl (ACCU-CHEK GUIDE) w/Device KIT USE TO CHECK BLOOD SUGAR ONCE DAILY OR AS DIRECTED. 1 kit 0   buPROPion (WELLBUTRIN XL) 150 MG 24 hr tablet Take 150 mg by mouth daily.     Cholecalciferol (VITAMIN D3) 125 MCG (5000 UT) CAPS Take by mouth.     Cyanocobalamin (VITAMIN B-12 PO) Take by mouth. Sometimes     Fe Fum-FePoly-FA-Vit C-Vit B3 (FOLIVANE-F) 125-1 MG CAPS Take 1 capsule three times a day with meals. 180 capsule 1   glucose blood test strip Use to check blood sugar once daily or as directed. 100 each 12   hydrOXYzine (ATARAX) 25 MG tablet TAKE 1 TABLET BY MOUTH EVERYDAY AT BEDTIME (Patient not taking: Reported on 05/26/2022) 90 tablet 1   Lancets MISC Use to  check blood sugar once daily or as directed. 200 each 12   LORazepam (ATIVAN) 1 MG tablet Take 1 mg by mouth 2 (two) times daily.     metFORMIN (GLUCOPHAGE) 500 MG tablet Take 1 tablet (500 mg total) by mouth daily with breakfast. 30 tablet 11   omeprazole (PRILOSEC) 20 MG capsule Take by mouth.     rosuvastatin (CRESTOR) 5 MG tablet Take 1 tablet (5 mg total) by mouth daily. 30 tablet 11   traZODone (DESYREL) 50 MG tablet Take  1 to 2 tablets  1 hour  before Bedtime as needed for Sleep 60 tablet 0   venlafaxine XR (EFFEXOR-XR) 150 MG 24 hr capsule Take 150 mg by mouth at bedtime.     No current facility-administered medications on file prior to visit.    Current Problems (verified) Patient Active Problem List   Diagnosis Date Noted   CKD stage 2 due to type 2 diabetes mellitus (HCC) 02/16/2022   Grief reaction 04/02/2020   S/P lumpectomy, right breast 09/17/2019   Medication management 02/18/2015   Hiatal hernia 02/18/2015    Abnormal glucose    Hyperlipidemia associated with type 2 diabetes mellitus (HCC)    Labile hypertension    Vitamin D deficiency    Hx of adenomatous colonic polyps 08/11/2010   Iron deficiency anemia 08/11/2010   Chronic anxiety 08/11/2010    Screening Tests Immunization History  Administered Date(s) Administered   Influenza, High Dose Seasonal PF 12/29/2018, 01/01/2021, 10/28/2021   PFIZER(Purple Top)SARS-COV-2 Vaccination 05/07/2019, 05/30/2019   Pneumococcal Conjugate-13 08/27/2013   Pneumococcal Polysaccharide-23 10/23/2015   Pneumococcal-Unspecified 12/06/2001   Tdap 08/05/2012   Zoster, Live 02/05/2010    Health Maintenance  Topic Date Due   Zoster Vaccines- Shingrix (1 of 2) 12/21/1954   COVID-19 Vaccine (3 - Pfizer risk series) 06/27/2019   OPHTHALMOLOGY EXAM  04/10/2022   DTaP/Tdap/Td (2 - Td or Tdap) 08/06/2022   INFLUENZA VACCINE  08/26/2022   HEMOGLOBIN A1C  11/26/2022   Diabetic kidney evaluation - Urine ACR  02/18/2023   Diabetic kidney evaluation - eGFR measurement  05/26/2023   FOOT EXAM  05/26/2023   Medicare Annual Wellness (AWV)  05/26/2023   Pneumonia Vaccine 26+ Years old  Completed   DEXA SCAN  Completed   HPV VACCINES  Aged Out   Colonoscopy  Discontinued     Allergies Allergies  Allergen Reactions   Morphine And Codeine     Unknown     SURGICAL HISTORY She  has a past surgical history that includes Cholecystectomy (2003); Knee arthroscopy; Bunionectomy; Wrist fracture surgery; Cataract extraction (Bilateral); Breast lumpectomy with radioactive seed localization (Right, 07/05/2019); Breast biopsy (Right, 10/19/2018); and Breast excisional biopsy (Right, 07/05/2019). FAMILY HISTORY Her family history includes Breast cancer in her cousin; Colon cancer in her father; Colon polyps in her maternal aunt; Pancreatic cancer in her paternal aunt; Stroke in her mother; Ulcerative colitis in her brother. SOCIAL HISTORY She  reports that she has never  smoked. She has never used smokeless tobacco. She reports that she does not drink alcohol and does not use drugs.  MEDICARE WELLNESS OBJECTIVES: Physical activity:   Cardiac risk factors:   Depression/mood screen:      05/26/2022   11:20 AM  Depression screen PHQ 2/9  Decreased Interest 1  Down, Depressed, Hopeless 1  PHQ - 2 Score 2  Altered sleeping 1  Tired, decreased energy 1  Change in appetite 1  Feeling bad or failure about yourself  1  Trouble concentrating 0  Moving slowly or fidgety/restless 0  Suicidal thoughts 0  PHQ-9 Score 6  Difficult doing work/chores Somewhat difficult    ADLs:     05/26/2022   11:21 AM 10/28/2021   12:04 PM  In your present state of health, do you have any difficulty performing the following activities:  Hearing? 0 0  Vision? 0 0  Difficulty concentrating or making decisions? 0 0  Walking or climbing stairs? 1 1  Dressing or bathing? 0 0  Doing errands, shopping? 0 0     Cognitive Testing  Alert? Yes  Normal Appearance?Yes  Oriented to person? Yes  Place? Yes   Time? Yes  Recall of three objects?  Yes  Can perform simple calculations? Yes  Displays appropriate judgment?Yes  Can read the correct time from a watch face?Yes  EOL planning:       Review of Systems  Constitutional:  Positive for malaise/fatigue. Negative for chills, diaphoresis, fever and weight loss.  HENT: Negative.  Negative for congestion, hearing loss, sinus pain, sore throat and tinnitus.   Eyes: Negative.  Negative for blurred vision and double vision.  Respiratory: Negative.  Negative for cough, hemoptysis, sputum production, shortness of breath and wheezing.   Cardiovascular: Negative.  Negative for chest pain, palpitations and leg swelling.  Gastrointestinal:  Positive for heartburn (Tums). Negative for abdominal pain, constipation, diarrhea, nausea and vomiting.  Genitourinary: Negative.  Negative for dysuria and urgency.  Musculoskeletal:  Positive for  joint pain (knees). Negative for back pain, falls, myalgias and neck pain.  Skin: Negative.  Negative for rash.  Neurological: Negative.  Negative for dizziness, tingling, tremors, weakness and headaches.  Endo/Heme/Allergies:  Does not bruise/bleed easily.  Psychiatric/Behavioral:  Positive for depression. Negative for suicidal ideas. The patient has insomnia (bad dreams). The patient is not nervous/anxious.      Objective:     There were no vitals taken for this visit. There is no height or weight on file to calculate BMI.  General appearance: alert, no distress, WD/WN,  female HEENT: normocephalic, sclerae anicteric, TMs pearly, nares patent, no discharge or erythema, pharynx normal Oral cavity: MMM, no lesions Neck: supple, no lymphadenopathy, no thyromegaly, no masses Heart: RRR, normal S1, S2, no murmurs Lungs: CTA bilaterally, no wheezes, rhonchi, or rales Abdomen: +bs, soft, non tender, non distended, no masses, no hepatomegaly, no splenomegaly Musculoskeletal: nontender, no swelling, no obvious deformity.  Extremities: no edema, no cyanosis, no clubbing Pulses: 2+ symmetric, upper and lower extremities, normal cap refill Neurological: alert, oriented x 3, CN2-12 intact, strength normal upper extremities and lower extremities, sensation normal throughout, DTRs 2+ throughout, no cerebellar signs, gait normal Psychiatric: normal affect, behavior normal, pleasant  Breast:   breasts appear normal, no suspicious masses, no skin or nipple changes or axillary nodes.    Medicare Attestation I have personally reviewed: The patient's medical and social history Their use of alcohol, tobacco or illicit drugs Their current medications and supplements The patient's functional ability including ADLs,fall risks, home safety risks, cognitive, and hearing and visual impairment Diet and physical activities Evidence for depression or mood disorders  The patient's weight, height, BMI, and  visual acuity have been recorded in the chart.  I have made referrals, counseling, and provided education to the patient based on review of the above and I have provided the patient with a written personalized care plan for preventive services.     Raynelle Dick, NP   09/06/2022

## 2022-09-07 ENCOUNTER — Encounter: Payer: Self-pay | Admitting: Nurse Practitioner

## 2022-09-07 ENCOUNTER — Ambulatory Visit (INDEPENDENT_AMBULATORY_CARE_PROVIDER_SITE_OTHER): Payer: Medicare Other | Admitting: Nurse Practitioner

## 2022-09-07 VITALS — BP 118/82 | HR 80 | Temp 97.5°F | Ht 63.0 in | Wt 131.8 lb

## 2022-09-07 DIAGNOSIS — F419 Anxiety disorder, unspecified: Secondary | ICD-10-CM

## 2022-09-07 DIAGNOSIS — E785 Hyperlipidemia, unspecified: Secondary | ICD-10-CM | POA: Diagnosis not present

## 2022-09-07 DIAGNOSIS — G8929 Other chronic pain: Secondary | ICD-10-CM | POA: Diagnosis not present

## 2022-09-07 DIAGNOSIS — E1169 Type 2 diabetes mellitus with other specified complication: Secondary | ICD-10-CM | POA: Diagnosis not present

## 2022-09-07 DIAGNOSIS — E1122 Type 2 diabetes mellitus with diabetic chronic kidney disease: Secondary | ICD-10-CM

## 2022-09-07 DIAGNOSIS — M25562 Pain in left knee: Secondary | ICD-10-CM | POA: Diagnosis not present

## 2022-09-07 DIAGNOSIS — N182 Chronic kidney disease, stage 2 (mild): Secondary | ICD-10-CM

## 2022-09-07 DIAGNOSIS — Z79899 Other long term (current) drug therapy: Secondary | ICD-10-CM

## 2022-09-07 DIAGNOSIS — R0989 Other specified symptoms and signs involving the circulatory and respiratory systems: Secondary | ICD-10-CM | POA: Diagnosis not present

## 2022-09-07 DIAGNOSIS — M25561 Pain in right knee: Secondary | ICD-10-CM | POA: Diagnosis not present

## 2022-09-07 DIAGNOSIS — E559 Vitamin D deficiency, unspecified: Secondary | ICD-10-CM

## 2022-09-07 DIAGNOSIS — F320 Major depressive disorder, single episode, mild: Secondary | ICD-10-CM

## 2022-09-07 LAB — CBC WITH DIFFERENTIAL/PLATELET
Absolute Monocytes: 437 cells/uL (ref 200–950)
Basophils Absolute: 30 cells/uL (ref 0–200)
Basophils Relative: 0.5 %
Eosinophils Absolute: 12 cells/uL — ABNORMAL LOW (ref 15–500)
Eosinophils Relative: 0.2 %
HCT: 38.5 % (ref 35.0–45.0)
Hemoglobin: 12.4 g/dL (ref 11.7–15.5)
Lymphs Abs: 1322 cells/uL (ref 850–3900)
MCH: 26.3 pg — ABNORMAL LOW (ref 27.0–33.0)
MCHC: 32.2 g/dL (ref 32.0–36.0)
MCV: 81.7 fL (ref 80.0–100.0)
MPV: 10.6 fL (ref 7.5–12.5)
Monocytes Relative: 7.4 %
Neutro Abs: 4101 cells/uL (ref 1500–7800)
Neutrophils Relative %: 69.5 %
Platelets: 410 10*3/uL — ABNORMAL HIGH (ref 140–400)
RBC: 4.71 10*6/uL (ref 3.80–5.10)
RDW: 14.2 % (ref 11.0–15.0)
Total Lymphocyte: 22.4 %
WBC: 5.9 10*3/uL (ref 3.8–10.8)

## 2022-09-07 NOTE — Patient Instructions (Addendum)
Continue Buproprion daily with Effexor to help control depression.  Monitor symptoms  Take Rosuvastatin every day for cholesterol  Will wait to see result of A1c to determine metformin use.     Please make sure you continue to decrease bad carbs like white bread, white rice, potatoes, corn, soft drinks, pasta, cereals, refined sugars, sweet tea, dried fruits, and fruit juice. Try to focus your diet on whole foods, focusing on fresh fruits and vegetables, beans, healthy fats (avocado, nuts, olives, seeds), and fish or lean protein if you choose to consume these in moderation. Good carbs are okay to eat in moderation like sweet potatoes, brown rice, old fashioned oats and whole grain (high fiber) pasta/bread. Exercise is also very helpful, as is adequate water intake - aim for 30 min of brisk walking daily, and 65-80+ fluid ounces of water daily.

## 2022-10-04 ENCOUNTER — Other Ambulatory Visit: Payer: Self-pay | Admitting: Internal Medicine

## 2022-10-04 DIAGNOSIS — Z1231 Encounter for screening mammogram for malignant neoplasm of breast: Secondary | ICD-10-CM

## 2022-11-02 ENCOUNTER — Ambulatory Visit
Admission: RE | Admit: 2022-11-02 | Discharge: 2022-11-02 | Disposition: A | Discharge status: Home / Self Care | Payer: Medicare Other | Source: Ambulatory Visit | Attending: Internal Medicine | Admitting: Internal Medicine

## 2022-11-02 DIAGNOSIS — Z1231 Encounter for screening mammogram for malignant neoplasm of breast: Secondary | ICD-10-CM | POA: Diagnosis not present

## 2022-11-03 ENCOUNTER — Ambulatory Visit: Payer: Medicare Other

## 2022-12-09 ENCOUNTER — Encounter: Payer: Self-pay | Admitting: Nurse Practitioner

## 2022-12-09 ENCOUNTER — Ambulatory Visit (INDEPENDENT_AMBULATORY_CARE_PROVIDER_SITE_OTHER): Payer: Medicare Other | Admitting: Nurse Practitioner

## 2022-12-09 VITALS — BP 128/82 | HR 76 | Temp 97.6°F | Ht 64.5 in | Wt 133.4 lb

## 2022-12-09 DIAGNOSIS — Z79899 Other long term (current) drug therapy: Secondary | ICD-10-CM

## 2022-12-09 DIAGNOSIS — M25561 Pain in right knee: Secondary | ICD-10-CM | POA: Diagnosis not present

## 2022-12-09 DIAGNOSIS — N182 Chronic kidney disease, stage 2 (mild): Secondary | ICD-10-CM

## 2022-12-09 DIAGNOSIS — G8929 Other chronic pain: Secondary | ICD-10-CM

## 2022-12-09 DIAGNOSIS — M25562 Pain in left knee: Secondary | ICD-10-CM | POA: Diagnosis not present

## 2022-12-09 DIAGNOSIS — E1169 Type 2 diabetes mellitus with other specified complication: Secondary | ICD-10-CM

## 2022-12-09 DIAGNOSIS — E785 Hyperlipidemia, unspecified: Secondary | ICD-10-CM

## 2022-12-09 DIAGNOSIS — E1122 Type 2 diabetes mellitus with diabetic chronic kidney disease: Secondary | ICD-10-CM | POA: Diagnosis not present

## 2022-12-09 DIAGNOSIS — R0989 Other specified symptoms and signs involving the circulatory and respiratory systems: Secondary | ICD-10-CM | POA: Diagnosis not present

## 2022-12-09 DIAGNOSIS — F320 Major depressive disorder, single episode, mild: Secondary | ICD-10-CM

## 2022-12-09 NOTE — Patient Instructions (Signed)

## 2022-12-09 NOTE — Progress Notes (Signed)
3 MONTH FOLLOW  Assessment:   Stephanie Roach was seen today for annual exam.  Diagnoses and all orders for this visit:  Current mild episode of major depressive disorder, unspecified whether recurrent (HCC) Stable Continue medications Behavior modifications of diet, exercise and Good sleep hygiene.  Labile hypertension Discussed DASH (Dietary Approaches to Stop Hypertension) DASH diet is lower in sodium than a typical American diet. Cut back on foods that are high in saturated fat, cholesterol, and trans fats. Eat more whole-grain foods, fish, poultry, and nuts Remain active and exercise as tolerated daily.  Monitor BP at home-Call if greater than 130/80.  Check CMP/CBC  Hyperlipidemia associate with Type 2 Diabetes Mellitus Discussed lifestyle modifications. Recommended diet heavy in fruits and veggies, omega 3's. Decrease consumption of animal meats, cheeses, and dairy products. Remain active and exercise as tolerated. Continue to monitor. Check lipids/TSH  CKD stage 2 associated with Type 2 Diabetes mellitus(HCC) Stay well hydrated. Avoid high salt foods. Avoid NSAIDS. Keep BP and BG well controlled.   Take medications as prescribed. Remain active and exercise as tolerated daily. Maintain weight.  Continue to monitor. Check CMP/GFR/Microablumin  Chronic pain of both knees Continue exercise, follow up with ortho as needed  Chronic anxiety Reviewed relaxation techniques.  Sleep hygiene. Recommended Cognitive Behavioral Therapy (CBT). Recommended mindfulness meditation and exercise.   Insight-oriented psychotherapy given for 16 minutes exclusively. Psychoeducation:  encouraged personality growth wand development through coping techniques and problem-solving skills. Limit/Decrease/Monitor drug/alcohol intake.    Type 2 Diabetes Mellitus with Stage 2 CKD Education: Reviewed 'ABCs' of diabetes management  Discussed goals to be met and/or maintained include A1C (<7) Blood  pressure (<130/80) Cholesterol (LDL <70) Continue Eye Exam yearly  Continue Dental Exam Q6 mo Discussed dietary recommendations Discussed Physical Activity recommendations Check A1C  Vitamin D deficiency Continue supplement for goal of 60-100 Monitor Vitamin D levels  Medication management All medications discussed and reviewed in full. All questions and concerns regarding medications addressed.    Orders Placed This Encounter  Procedures   CBC with Differential/Platelet   COMPLETE METABOLIC PANEL WITH GFR   Lipid panel   Hemoglobin A1c    Notify office for further evaluation and treatment, questions or concerns if any reported s/s fail to improve.   The patient was advised to call back or seek an in-person evaluation if any symptoms worsen or if the condition fails to improve as anticipated.   Further disposition pending results of labs. Discussed med's effects and SE's.    I discussed the assessment and treatment plan with the patient. The patient was provided an opportunity to ask questions and all were answered. The patient agreed with the plan and demonstrated an understanding of the instructions.  Discussed med's effects and SE's. Screening labs and tests as requested with regular follow-up as recommended.  I provided 30 minutes of face-to-face time during this encounter including counseling, chart review, and critical decision making was preformed.  Today's Plan of Care is based on a patient-centered health care approach known as shared decision making - the decisions, tests and treatments allow for patient preferences and values to be balanced with clinical evidence.     Future Appointments  Date Time Provider Department Center  03/18/2023 11:00 AM Lucky Cowboy, MD [facility] None    Subjective:   Stephanie Roach is a 87 y.o. female who presents for 3 month follow up for chol, preDM, and HTN.   Overall she reports feeling well today.  Continues to have  increase  stress d/t daughter living with her, now new husband has moved in.    Husband, Stephanie Roach is deceased.  Continues to have issues with depression/anxiety. She does have limited interactions with her friends. She is on Effexor 150 mg daily, Wellbutrin 150 mg every day (has a hx of not taking regularly) and Ativan 1 mg as needed. She sees Dr. Donell Beers for Ativan.  She is using Trazodone and Ativan for sleep and has been sleeping ok.  Sometimes difficulty falling asleep and having nightmares.   Her blood pressure has been controlled at home without medication, today their BP is BP: 128/82  BP Readings from Last 3 Encounters:  12/09/22 128/82  09/07/22 118/82  05/26/22 132/72  She does not workout.  She denies chest pain,dizziness.   BMI is Body mass index is 22.54 kg/m., she has been working on diet and exercise. She has stopped eating sweets. 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  She is on integra and following with Dr. Arbutus Ped. Lab Results  Component Value Date   IRON 88 07/23/2021   TIBC 520 (H) 07/23/2021   FERRITIN 45 07/23/2021   She is on Rosuvastatin 5 mg but only taking once a week, no side effects. . Her cholesterol is not at goal. The cholesterol last visit was:   Lab Results  Component Value Date   CHOL 239 (H) 09/07/2022   HDL 69 09/07/2022   LDLCALC 135 (H) 09/07/2022   TRIG 208 (H) 09/07/2022   CHOLHDL 3.5 09/07/2022    She has been working on diet and exercise for prediabetes, and denies polydipsia, polyuria and visual disturbances. Taking Metformin 500 mg 1 tab several days a week but having diarrhea  Last A1C in the office was:  Lab Results  Component Value Date   HGBA1C 7.0 (H) 09/07/2022   Patient is on  Vitamin D supplement, 2000 IU.   Lab Results  Component Value Date   VD25OH 41 02/17/2022     Saw Dr. Luiz Blare for bilateral knee pain, got injections that helped. Has not had an injection for awhile, pain persists continually. She is having continued knee pain and needs to have another visit for injections    Patient Care Team: Lucky Cowboy, MD as PCP - General (Internal Medicine) Hart Carwin, MD (Inactive) as Consulting Physician (Gastroenterology) Archer Asa, MD as Consulting Physician (Psychiatry) Si Gaul, MD as Consulting Physician (Oncology) Sanjuana Letters, MD as Referring Physician (Orthopedic Surgery) Cindee Salt, MD as Consulting Physician (Orthopedic Surgery)   Medication Review Current Outpatient Medications on File Prior to Visit  Medication Sig Dispense Refill   Blood Glucose Monitoring Suppl (ACCU-CHEK GUIDE ME) w/Device KIT USE TO CHECK BLOOD SUGAR ONCE DAILY OR AS DIRECTED. 1 kit 0   Blood Glucose Monitoring Suppl (ACCU-CHEK GUIDE) w/Device KIT USE TO CHECK BLOOD SUGAR ONCE DAILY OR AS DIRECTED. 1 kit 0   buPROPion (WELLBUTRIN XL) 150 MG 24 hr tablet Take 150 mg by mouth daily.     Cholecalciferol (VITAMIN D3) 125 MCG (5000 UT) CAPS Take by mouth.     Cyanocobalamin (VITAMIN B-12 PO) Take by mouth. Sometimes     Fe Fum-FePoly-FA-Vit C-Vit B3 (FOLIVANE-F) 125-1 MG CAPS Take 1  capsule three times a day with meals. 180 capsule 1   glucose blood test strip Use to check blood sugar once daily or as directed. 100 each 12   hydrOXYzine (ATARAX) 25 MG tablet TAKE 1 TABLET BY MOUTH EVERYDAY AT BEDTIME 90 tablet 1   Lancets MISC Use to check blood sugar once daily or as directed. 200 each 12   LORazepam (ATIVAN) 1 MG tablet Take 1 mg by mouth 2 (two) times daily.     omeprazole (PRILOSEC) 20 MG capsule Take by mouth.     rosuvastatin (CRESTOR) 5 MG tablet Take 1 tablet (5 mg total) by mouth daily. 30 tablet 11   traZODone (DESYREL) 50 MG tablet Take  1 to 2  tablets  1 hour  before Bedtime as needed for Sleep 60 tablet 0   venlafaxine XR (EFFEXOR-XR) 150 MG 24 hr capsule Take 150 mg by mouth at bedtime.     metFORMIN (GLUCOPHAGE) 500 MG tablet Take 1 tablet (500 mg total) by mouth daily with breakfast. (Patient not taking: Reported on 12/09/2022) 30 tablet 11   No current facility-administered medications on file prior to visit.    Current Problems (verified) Patient Active Problem List   Diagnosis Date Noted   CKD stage 2 due to type 2 diabetes mellitus (HCC) 02/16/2022   Grief reaction 04/02/2020   S/P lumpectomy, right breast 09/17/2019   Medication management 02/18/2015   Hiatal hernia 02/18/2015   Abnormal glucose    Hyperlipidemia associated with type 2 diabetes mellitus (HCC)    Labile hypertension    Vitamin D deficiency    Hx of adenomatous colonic polyps 08/11/2010   Iron deficiency anemia 08/11/2010   Chronic anxiety 08/11/2010       Allergies Allergies  Allergen Reactions   Morphine And Codeine     Unknown     SURGICAL HISTORY She  has a past surgical history that includes Cholecystectomy (2003); Knee arthroscopy; Bunionectomy; Wrist fracture surgery; Cataract extraction (Bilateral); Breast lumpectomy with radioactive seed localization (Right, 07/05/2019); Breast biopsy (Right, 10/19/2018); and Breast excisional biopsy (Right, 07/05/2019). FAMILY HISTORY Her family history includes Breast cancer in her cousin; Colon cancer in her father; Colon polyps in her maternal aunt; Pancreatic cancer in her paternal aunt; Stroke in her mother; Ulcerative colitis in her brother. SOCIAL HISTORY She  reports that she has never smoked. She has never used smokeless tobacco. She reports that she does not drink alcohol and does not use drugs.   Review of Systems  Constitutional:  Positive for malaise/fatigue. Negative for chills, diaphoresis, fever and weight loss.  HENT: Negative.  Negative for congestion, hearing loss, sinus pain,  sore throat and tinnitus.   Eyes: Negative.  Negative for blurred vision and double vision.  Respiratory:  Positive for cough (dry). Negative for hemoptysis, sputum production, shortness of breath and wheezing.   Cardiovascular: Negative.  Negative for chest pain, palpitations and leg swelling.  Gastrointestinal:  Positive for heartburn (Tums). Negative for abdominal pain, constipation, diarrhea, nausea and vomiting.  Genitourinary: Negative.  Negative for dysuria and urgency.  Musculoskeletal:  Positive for joint pain (knees). Negative for back pain, falls, myalgias and neck pain.  Skin: Negative.  Negative for rash.  Neurological: Negative.  Negative for dizziness, tingling, tremors, weakness and headaches.  Endo/Heme/Allergies:  Does not bruise/bleed easily.  Psychiatric/Behavioral:  Positive for depression. Negative for suicidal ideas. The patient has insomnia (bad dreams). The patient is not nervous/anxious.      Objective:  Blood pressure 128/82, pulse 76, temperature 97.6 F (36.4 C), height 5' 4.5" (1.638 m), weight 133 lb 6.4 oz (60.5 kg), SpO2 98%. Body mass index is 22.54 kg/m.  General appearance: alert, no distress, WD/WN,  female HEENT: normocephalic, sclerae anicteric, TMs pearly, nares patent, no discharge or erythema, pharynx normal Oral cavity: MMM, no lesions Neck: supple, no lymphadenopathy, no thyromegaly, no masses Heart: RRR, normal S1, S2, no murmurs Lungs: CTA bilaterally, no wheezes, rhonchi, or rales Abdomen: +bs, soft, non tender, non distended, no masses, no hepatomegaly, no splenomegaly Musculoskeletal: nontender, no swelling, no obvious deformity.  Extremities: no edema, no cyanosis, no clubbing Pulses: 2+ symmetric, upper and lower extremities, normal cap refill Neurological: alert, oriented x 3, CN2-12 intact, strength normal upper extremities and lower extremities, sensation normal throughout, DTRs 2+ throughout, no cerebellar signs, gait  normal Psychiatric: normal affect, behavior normal, pleasant  Breast:   breasts appear normal, no suspicious masses, no skin or nipple changes or axillary nodes.    Adela Glimpse, NP   12/09/2022

## 2022-12-10 LAB — HEMOGLOBIN A1C
Hgb A1c MFr Bld: 7.5 %{Hb} — ABNORMAL HIGH (ref <5.7)
Mean Plasma Glucose: 169 mg/dL
eAG (mmol/L): 9.3 mmol/L

## 2022-12-10 LAB — CBC WITH DIFFERENTIAL/PLATELET
Absolute Lymphocytes: 1075 {cells}/uL (ref 850–3900)
Absolute Monocytes: 415 {cells}/uL (ref 200–950)
Basophils Absolute: 20 {cells}/uL (ref 0–200)
Basophils Relative: 0.4 %
Eosinophils Absolute: 0 {cells}/uL — ABNORMAL LOW (ref 15–500)
Eosinophils Relative: 0 %
HCT: 42.3 % (ref 35.0–45.0)
Hemoglobin: 13.6 g/dL (ref 11.7–15.5)
MCH: 26.3 pg — ABNORMAL LOW (ref 27.0–33.0)
MCHC: 32.2 g/dL (ref 32.0–36.0)
MCV: 81.8 fL (ref 80.0–100.0)
MPV: 11.1 fL (ref 7.5–12.5)
Monocytes Relative: 8.3 %
Neutro Abs: 3490 {cells}/uL (ref 1500–7800)
Neutrophils Relative %: 69.8 %
Platelets: 282 10*3/uL (ref 140–400)
RBC: 5.17 10*6/uL — ABNORMAL HIGH (ref 3.80–5.10)
RDW: 12.8 % (ref 11.0–15.0)
Total Lymphocyte: 21.5 %
WBC: 5 10*3/uL (ref 3.8–10.8)

## 2022-12-10 LAB — LIPID PANEL
Cholesterol: 190 mg/dL (ref <200)
HDL: 75 mg/dL (ref >=50)
LDL Cholesterol (Calc): 89 mg/dL
Non-HDL Cholesterol (Calc): 115 mg/dL (ref <130)
Total CHOL/HDL Ratio: 2.5 (calc) (ref <5.0)
Triglycerides: 166 mg/dL — ABNORMAL HIGH (ref <150)

## 2022-12-10 LAB — COMPLETE METABOLIC PANEL WITH GFR
AG Ratio: 1.8 (calc) (ref 1.0–2.5)
ALT: 12 U/L (ref 6–29)
AST: 14 U/L (ref 10–35)
Albumin: 4.6 g/dL (ref 3.6–5.1)
Alkaline phosphatase (APISO): 81 U/L (ref 37–153)
BUN: 10 mg/dL (ref 7–25)
CO2: 32 mmol/L (ref 20–32)
Calcium: 9.7 mg/dL (ref 8.6–10.4)
Chloride: 99 mmol/L (ref 98–110)
Creat: 0.64 mg/dL (ref 0.60–0.95)
Globulin: 2.6 g/dL (ref 1.9–3.7)
Glucose, Bld: 150 mg/dL — ABNORMAL HIGH (ref 65–99)
Potassium: 3.6 mmol/L (ref 3.5–5.3)
Sodium: 141 mmol/L (ref 135–146)
Total Bilirubin: 0.5 mg/dL (ref 0.2–1.2)
Total Protein: 7.2 g/dL (ref 6.1–8.1)
eGFR: 87 mL/min/{1.73_m2} (ref >=60)

## 2022-12-14 ENCOUNTER — Encounter: Payer: Self-pay | Admitting: Nurse Practitioner

## 2022-12-20 NOTE — Progress Notes (Deleted)
Assessment and Plan:  There are no diagnoses linked to this encounter.    Further disposition pending results of labs. Discussed med's effects and SE's.   Over 30 minutes of exam, counseling, chart review, and critical decision making was performed.   Future Appointments  Date Time Provider Department Center  12/21/2022  4:00 PM Raynelle Dick, NP [facility] None  03/18/2023 11:00 AM Lucky Cowboy, MD [facility] None    ------------------------------------------------------------------------------------------------------------------   HPI There were no vitals taken for this visit. 87 y.o.female presents for  Past Medical History:  Diagnosis Date   Allergic rhinitis    Anemia    Anxiety    Arthritis    Depression    GERD (gastroesophageal reflux disease)    Hyperlipidemia    Hypertension    Migraine    Prediabetes    Sleep apnea    no cpap   Vitamin D deficiency      Allergies  Allergen Reactions   Morphine And Codeine     Unknown     Current Outpatient Medications on File Prior to Visit  Medication Sig   Blood Glucose Monitoring Suppl (ACCU-CHEK GUIDE ME) w/Device KIT USE TO CHECK BLOOD SUGAR ONCE DAILY OR AS DIRECTED.   Blood Glucose Monitoring Suppl (ACCU-CHEK GUIDE) w/Device KIT USE TO CHECK BLOOD SUGAR ONCE DAILY OR AS DIRECTED.   buPROPion (WELLBUTRIN XL) 150 MG 24 hr tablet Take 150 mg by mouth daily.   Cholecalciferol (VITAMIN D3) 125 MCG (5000 UT) CAPS Take by mouth.   Cyanocobalamin (VITAMIN B-12 PO) Take by mouth. Sometimes   Fe Fum-FePoly-FA-Vit C-Vit B3 (FOLIVANE-F) 125-1 MG CAPS Take 1 capsule three times a day with meals.   glucose blood test strip Use to check blood sugar once daily or as directed.   hydrOXYzine (ATARAX) 25 MG tablet TAKE 1 TABLET BY MOUTH EVERYDAY AT BEDTIME   Lancets MISC Use to check blood sugar once daily or as directed.   LORazepam (ATIVAN) 1 MG tablet Take 1 mg by mouth 2 (two) times daily.   metFORMIN (GLUCOPHAGE)  500 MG tablet Take 1 tablet (500 mg total) by mouth daily with breakfast. (Patient not taking: Reported on 12/09/2022)   omeprazole (PRILOSEC) 20 MG capsule Take by mouth.   rosuvastatin (CRESTOR) 5 MG tablet Take 1 tablet (5 mg total) by mouth daily.   traZODone (DESYREL) 50 MG tablet Take  1 to 2 tablets  1 hour  before Bedtime as needed for Sleep   venlafaxine XR (EFFEXOR-XR) 150 MG 24 hr capsule Take 150 mg by mouth at bedtime.   No current facility-administered medications on file prior to visit.    ROS: all negative except above.   Physical Exam:  There were no vitals taken for this visit.  General Appearance: Well nourished, in no apparent distress. Eyes: PERRLA, EOMs, conjunctiva no swelling or erythema Sinuses: No Frontal/maxillary tenderness ENT/Mouth: Ext aud canals clear, TMs without erythema, bulging. No erythema, swelling, or exudate on post pharynx.  Tonsils not swollen or erythematous. Hearing normal.  Neck: Supple, thyroid normal.  Respiratory: Respiratory effort normal, BS equal bilaterally without rales, rhonchi, wheezing or stridor.  Cardio: RRR with no MRGs. Brisk peripheral pulses without edema.  Abdomen: Soft, + BS.  Non tender, no guarding, rebound, hernias, masses. Lymphatics: Non tender without lymphadenopathy.  Musculoskeletal: Full ROM, 5/5 strength, normal gait.  Skin: Warm, dry without rashes, lesions, ecchymosis.  Neuro: Cranial nerves intact. Normal muscle tone, no cerebellar symptoms. Sensation intact.  Psych: Awake and  oriented X 3, normal affect, Insight and Judgment appropriate.     Raynelle Dick, NP 3:49 PM [facility]

## 2022-12-21 ENCOUNTER — Ambulatory Visit: Payer: Medicare Other | Admitting: Nurse Practitioner

## 2023-02-18 ENCOUNTER — Encounter: Payer: Medicare Other | Admitting: Internal Medicine

## 2023-03-18 ENCOUNTER — Encounter: Payer: Self-pay | Admitting: Internal Medicine

## 2023-03-29 ENCOUNTER — Other Ambulatory Visit: Payer: Self-pay

## 2023-03-29 DIAGNOSIS — D508 Other iron deficiency anemias: Secondary | ICD-10-CM

## 2023-03-29 MED ORDER — FOLIVANE-F 125-1 MG PO CAPS: ORAL_CAPSULE | ORAL | 0 refills | Status: DC

## 2023-03-31 ENCOUNTER — Other Ambulatory Visit: Payer: Self-pay

## 2023-03-31 ENCOUNTER — Encounter: Payer: Self-pay | Admitting: *Deleted

## 2023-03-31 DIAGNOSIS — D508 Other iron deficiency anemias: Secondary | ICD-10-CM

## 2023-03-31 MED ORDER — FOLIVANE-F 125-1 MG PO CAPS: ORAL_CAPSULE | ORAL | 1 refills | Status: DC

## 2023-04-01 ENCOUNTER — Ambulatory Visit: Payer: Self-pay | Admitting: Nurse Practitioner

## 2023-04-13 ENCOUNTER — Encounter: Payer: Self-pay | Admitting: Internal Medicine

## 2023-04-13 ENCOUNTER — Ambulatory Visit: Payer: PPO | Admitting: Internal Medicine

## 2023-04-13 VITALS — BP 116/72 | HR 81 | Temp 98.1°F | Ht 64.5 in | Wt 135.8 lb

## 2023-04-13 DIAGNOSIS — E559 Vitamin D deficiency, unspecified: Secondary | ICD-10-CM

## 2023-04-13 DIAGNOSIS — N182 Chronic kidney disease, stage 2 (mild): Secondary | ICD-10-CM | POA: Diagnosis not present

## 2023-04-13 DIAGNOSIS — H6122 Impacted cerumen, left ear: Secondary | ICD-10-CM

## 2023-04-13 DIAGNOSIS — Z7984 Long term (current) use of oral hypoglycemic drugs: Secondary | ICD-10-CM

## 2023-04-13 DIAGNOSIS — E785 Hyperlipidemia, unspecified: Secondary | ICD-10-CM

## 2023-04-13 DIAGNOSIS — E538 Deficiency of other specified B group vitamins: Secondary | ICD-10-CM | POA: Diagnosis not present

## 2023-04-13 DIAGNOSIS — E1122 Type 2 diabetes mellitus with diabetic chronic kidney disease: Secondary | ICD-10-CM | POA: Diagnosis not present

## 2023-04-13 DIAGNOSIS — F419 Anxiety disorder, unspecified: Secondary | ICD-10-CM

## 2023-04-13 DIAGNOSIS — E1169 Type 2 diabetes mellitus with other specified complication: Secondary | ICD-10-CM

## 2023-04-13 DIAGNOSIS — M19032 Primary osteoarthritis, left wrist: Secondary | ICD-10-CM

## 2023-04-13 DIAGNOSIS — D509 Iron deficiency anemia, unspecified: Secondary | ICD-10-CM | POA: Diagnosis not present

## 2023-04-13 DIAGNOSIS — K219 Gastro-esophageal reflux disease without esophagitis: Secondary | ICD-10-CM

## 2023-04-13 LAB — LIPID PANEL
Cholesterol: 194 mg/dL (ref 0–200)
HDL: 71.6 mg/dL (ref >39.00)
LDL Cholesterol: 93 mg/dL (ref 0–99)
NonHDL: 122.75
Total CHOL/HDL Ratio: 3
Triglycerides: 148 mg/dL (ref 0.0–149.0)
VLDL: 29.6 mg/dL (ref 0.0–40.0)

## 2023-04-13 LAB — VITAMIN B12: Vitamin B-12: 254 pg/mL (ref 211–911)

## 2023-04-13 LAB — COMPREHENSIVE METABOLIC PANEL
ALT: 12 U/L (ref 0–35)
AST: 15 U/L (ref 0–37)
Albumin: 4.5 g/dL (ref 3.5–5.2)
Alkaline Phosphatase: 88 U/L (ref 39–117)
BUN: 15 mg/dL (ref 6–23)
CO2: 28 meq/L (ref 19–32)
Calcium: 9.6 mg/dL (ref 8.4–10.5)
Chloride: 102 meq/L (ref 96–112)
Creatinine, Ser: 0.61 mg/dL (ref 0.40–1.20)
GFR: 81.59 mL/min (ref >60.00)
Glucose, Bld: 182 mg/dL — ABNORMAL HIGH (ref 70–99)
Potassium: 3.8 meq/L (ref 3.5–5.1)
Sodium: 139 meq/L (ref 135–145)
Total Bilirubin: 0.4 mg/dL (ref 0.2–1.2)
Total Protein: 7.4 g/dL (ref 6.0–8.3)

## 2023-04-13 LAB — CBC WITH DIFFERENTIAL/PLATELET
Basophils Absolute: 0 10*3/uL (ref 0.0–0.1)
Basophils Relative: 0.2 % (ref 0.0–3.0)
Eosinophils Absolute: 0 10*3/uL (ref 0.0–0.7)
Eosinophils Relative: 0.1 % (ref 0.0–5.0)
HCT: 41.3 % (ref 36.0–46.0)
Hemoglobin: 13.7 g/dL (ref 12.0–15.0)
Lymphocytes Relative: 22.5 % (ref 12.0–46.0)
Lymphs Abs: 1.1 10*3/uL (ref 0.7–4.0)
MCHC: 33.1 g/dL (ref 30.0–36.0)
MCV: 81.6 fl (ref 78.0–100.0)
Monocytes Absolute: 0.5 10*3/uL (ref 0.1–1.0)
Monocytes Relative: 9.2 % (ref 3.0–12.0)
Neutro Abs: 3.4 10*3/uL (ref 1.4–7.7)
Neutrophils Relative %: 68 % (ref 43.0–77.0)
Platelets: 302 10*3/uL (ref 150.0–400.0)
RBC: 5.06 Mil/uL (ref 3.87–5.11)
RDW: 13.9 % (ref 11.5–15.5)
WBC: 5 10*3/uL (ref 4.0–10.5)

## 2023-04-13 LAB — HEMOGLOBIN A1C: Hgb A1c MFr Bld: 8.1 % — ABNORMAL HIGH (ref 4.6–6.5)

## 2023-04-13 LAB — MICROALBUMIN / CREATININE URINE RATIO
Creatinine,U: 133.2 mg/dL
Microalb Creat Ratio: 15.4 mg/g (ref 0.0–30.0)
Microalb, Ur: 2 mg/dL — ABNORMAL HIGH (ref 0.0–1.9)

## 2023-04-13 LAB — TSH: TSH: 2.32 u[IU]/mL (ref 0.35–5.50)

## 2023-04-13 LAB — VITAMIN D 25 HYDROXY (VIT D DEFICIENCY, FRACTURES): VITD: 48.89 ng/mL (ref 30.00–100.00)

## 2023-04-13 MED ORDER — ROSUVASTATIN CALCIUM 5 MG PO TABS: 5.0000 mg | ORAL_TABLET | Freq: Every day | ORAL | 3 refills | Status: DC

## 2023-04-13 MED ORDER — DICLOFENAC SODIUM 1 % EX GEL: 2.0000 g | Freq: Four times a day (QID) | CUTANEOUS | 2 refills | Status: DC | PRN

## 2023-04-13 NOTE — Progress Notes (Signed)
 [facility] [address] [place] [address] Dept: [phone] Dept Fax: [phone]  New Patient Office Visit  Subjective:   Stephanie Roach July 02, 1935 04/13/2023  Chief Complaint  Patient presents with   Establish Care    HPI: ANIE JUNIEL presents today to establish care at [facility]. Introduced to Publishing rights manager role and practice setting.  All questions answered.  Concerns: See below   Discussed the use of AI scribe software for clinical note transcription with the patient, who gave verbal consent to proceed.  History of Present Illness   The patient, with a history of diabetes, kidney disease, and high cholesterol, presents for an initial visit with a new primary care provider. Her provider Dr. Oneta Rack recently passed away. She has been managing her diabetes with metformin, but has recently stopped taking it due to concerns about potential kidney damage. She reports no recent blood sugar checks. She does take her crestor daily for HLD.   She has a history of vitamin D and B12 deficiencies, for which she takes supplements. She also has a history of iron deficiency anemia and has been taking an iron supplement, but recently ran out. She plans to resume taking it soon.  The patient also has a history of anxiety, which she manages with Effexor daily and Ativan as needed. She reports increased stress due to personal circumstances, including the death of her husband three years ago and ongoing issues with her daughter.  She reports arthritis in her left wrist, which has been causing her pain. Has not tried anything for pain. She also reports impacted cerumen in her left ear, which she believes is affecting her hearing. She plans to schedule an appointment with an ENT for cerumen removal.   The patient is due for a pneumonia vaccine and a shingles vaccine. She plans to get the pneumonia  vaccine at her next visit and the shingles vaccine at her pharmacy.         The following portions of the patient's history were reviewed and updated as appropriate: past medical history, past surgical history, family history, social history, allergies, medications, and problem list.   Patient Active Problem List   Diagnosis Date Noted   CKD stage 2 due to type 2 diabetes mellitus (HCC) 02/16/2022   Grief reaction 04/02/2020   S/P lumpectomy, right breast 09/17/2019   Medication management 02/18/2015   Hiatal hernia 02/18/2015   Abnormal glucose    Hyperlipidemia associated with type 2 diabetes mellitus (HCC)    Labile hypertension    Vitamin D deficiency    Hx of adenomatous colonic polyps 08/11/2010   Iron deficiency anemia 08/11/2010   Chronic anxiety 08/11/2010   Past Medical History:  Diagnosis Date   Allergic rhinitis    Anemia    Anxiety    Arthritis    Depression    GERD (gastroesophageal reflux disease)    Hyperlipidemia    Hypertension    Migraine    Prediabetes    Sleep apnea    no cpap   Vitamin D deficiency    Past Surgical History:  Procedure Laterality Date   BREAST BIOPSY Right 10/19/2018   FIBROCYSTIC CHANGE WITH USUAL DUCTAL HYPERPLASIA,   BREAST EXCISIONAL BIOPSY Right 07/05/2019   Negative for in situ or invasive carcinoma   BREAST LUMPECTOMY WITH RADIOACTIVE SEED LOCALIZATION Right 07/05/2019   Procedure: RIGHT BREAST LUMPECTOMY WITH RADIOACTIVE SEED LOCALIZATION;  Surgeon: Abigail Miyamoto, MD;  Location: [facility] OR;  Service: General;  Laterality: Right;   BUNIONECTOMY     CATARACT EXTRACTION Bilateral    CHOLECYSTECTOMY  2003   KNEE ARTHROSCOPY     WRIST FRACTURE SURGERY     Family History  Problem Relation Age of Onset   Colon cancer Father    Stroke Mother    Breast cancer Cousin    Pancreatic cancer Paternal Aunt    Ulcerative colitis Brother    Colon polyps Maternal Aunt     Current Outpatient Medications:    Cholecalciferol  (VITAMIN D3) 125 MCG (5000 UT) CAPS, Take by mouth., Disp: , Rfl:    diclofenac Sodium (VOLTAREN) 1 % GEL, Apply 2 g topically 4 (four) times daily as needed., Disp: 100 g, Rfl: 2   LORazepam (ATIVAN) 1 MG tablet, Take 1 mg by mouth 2 (two) times daily., Disp: , Rfl:    omeprazole (PRILOSEC) 20 MG capsule, Take by mouth., Disp: , Rfl:    traZODone (DESYREL) 50 MG tablet, Take  1 to 2 tablets  1 hour  before Bedtime as needed for Sleep, Disp: 60 tablet, Rfl: 0   venlafaxine XR (EFFEXOR-XR) 150 MG 24 hr capsule, Take 150 mg by mouth at bedtime., Disp: , Rfl:    Blood Glucose Monitoring Suppl (ACCU-CHEK GUIDE ME) w/Device KIT, USE TO CHECK BLOOD SUGAR ONCE DAILY OR AS DIRECTED. (Patient not taking: Reported on 04/13/2023), Disp: 1 kit, Rfl: 0   Blood Glucose Monitoring Suppl (ACCU-CHEK GUIDE) w/Device KIT, USE TO CHECK BLOOD SUGAR ONCE DAILY OR AS DIRECTED. (Patient not taking: Reported on 04/13/2023), Disp: 1 kit, Rfl: 0   Cyanocobalamin (VITAMIN B-12 PO), Take by mouth. Sometimes (Patient not taking: Reported on 04/13/2023), Disp: , Rfl:    Fe Fum-FePoly-FA-Vit C-Vit B3 (FOLIVANE-F) 125-1 MG CAPS, Take 1 capsule three times a day with meals. (Patient not taking: Reported on 04/13/2023), Disp: 180 capsule, Rfl: 0   Fe Fum-FePoly-FA-Vit C-Vit B3 (FOLIVANE-F) 125-1 MG CAPS, Take 1 capsule Daily (Patient not taking: Reported on 04/13/2023), Disp: 90 capsule, Rfl: 1   glucose blood test strip, Use to check blood sugar once daily or as directed. (Patient not taking: Reported on 04/13/2023), Disp: 100 each, Rfl: 12   Lancets MISC, Use to check blood sugar once daily or as directed. (Patient not taking: Reported on 04/13/2023), Disp: 200 each, Rfl: 12   metFORMIN (GLUCOPHAGE) 500 MG tablet, Take 1 tablet (500 mg total) by mouth daily with breakfast. (Patient not taking: Reported on 04/13/2023), Disp: 30 tablet, Rfl: 11   rosuvastatin (CRESTOR) 5 MG tablet, Take 1 tablet (5 mg total) by mouth daily., Disp: 90 tablet,  Rfl: 3 Allergies  Allergen Reactions   Morphine And Codeine     Unknown     ROS: A complete ROS was performed with pertinent positives/negatives noted in the HPI. The remainder of the ROS are negative.   Objective:   Today's Vitals   04/13/23 0955  BP: 116/72  Pulse: 81  Temp: 98.1 F (36.7 C)  TempSrc: Temporal  SpO2: 95%  Weight: 135 lb 12.8 oz (61.6 kg)  Height: 5' 4.5" (1.638 m)    GENERAL: Well-appearing, in NAD. Well nourished.  SKIN: Pink, warm and dry. No rash, lesion, ulceration, or ecchymoses.  HEENT:    HEAD: Normocephalic, non-traumatic.  EYES: Conjunctive pink without exudate. EARS: External ear w/o redness, swelling, masses, or lesions. Impacted cerumen is noted to left ear.  NECK: Trachea midline. Full ROM w/o pain or tenderness. No  lymphadenopathy.  RESPIRATORY: Chest wall symmetrical. Respirations even and non-labored. Breath sounds clear to auscultation bilaterally.  CARDIAC: S1, S2 present, regular rate and rhythm. Peripheral pulses 2+ bilaterally.  MSK: Muscle tone and strength appropriate for age. Pain to medial aspect of left wrist without obvious deformities.  EXTREMITIES: Without clubbing, cyanosis, or edema.  NEUROLOGIC:  Steady, even gait.  PSYCH/MENTAL STATUS: Alert, oriented x 3. Cooperative, appropriate mood and affect.   Health Maintenance Due  Topic Date Due   Zoster Vaccines- Shingrix (1 of 2) 12/21/1954   Diabetic kidney evaluation - Urine ACR  02/18/2023   Medicare Annual Wellness (AWV)  05/26/2023    No results found for any visits on 04/13/23. Assessment & Plan:  Assessment and Plan    Type 2 Diabetes Mellitus with Stage 2 Chronic Kidney Disease (CKD) She has not been taking metformin due to kidney health concerns. - Order A1c and comprehensive metabolic panel (CMP). - Consider resuming metformin based on A1c results.  Hyperlipidemia Managed with rosuvastatin. - Refill rosuvastatin. - Order lipid panel.  Iron Deficiency  Anemia Inconsistent iron supplement intake due to cost. - Order complete blood count (CBC) and iron profile - Advise to resume iron supplement.  Vitamin D Deficiency Currently taking supplements. - Order vitamin D level test.  Vitamin B12 Deficiency Currently taking supplements. - Order vitamin B12 level test.  Chronic Anxiety Exacerbated by personal stressors, managed with Effexor and Ativan. - Continue Effexor once daily. - Continue Ativan 1 mg as needed.  Gastroesophageal Reflux Disease (GERD) Managed with omeprazole. - Continue omeprazole.  Osteoarthritis of Left Wrist Causing pain. - Rx for Voltaren QID PRN pain.   Impacted Cerumen in Left Ear Decreased hearing due to cerumen impaction. Prefers ENT for removal instead of PCP.  - Advise to schedule appointment with ENT for cerumen removal before hearing evaluation.  General Health Maintenance Due for Prevnar 20 and shingles vaccines. Discussed logistics and insurance coverage. - Administer Prevnar 20 vaccine at next visit. - Advise to obtain shingles vaccine at pharmacy.  Follow-up - Schedule follow-up appointment in four months. - Advise to call for appointment if any issues arise before then.       Orders Placed This Encounter  Procedures   CBC with Differential/Platelet   Comprehensive metabolic panel   Hemoglobin A1c   Lipid panel   TSH   Microalbumin / creatinine urine ratio   VITAMIN D 25 Hydroxy (Vit-D Deficiency, Fractures)   Vitamin B12   Iron, TIBC and Ferritin Panel   Meds ordered this encounter  Medications   rosuvastatin (CRESTOR) 5 MG tablet    Sig: Take 1 tablet (5 mg total) by mouth daily.    Dispense:  90 tablet    Refill:  3    Supervising Provider:   Garnette Gunner [[id number]]   diclofenac Sodium (VOLTAREN) 1 % GEL    Sig: Apply 2 g topically 4 (four) times daily as needed.    Dispense:  100 g    Refill:  2    Supervising Provider:   Garnette Gunner [[id number]]    Return in  about 4 months (around 08/13/2023) for Chronic Condition follow up.   Salvatore Decent, FNP

## 2023-04-14 LAB — IRON,TIBC AND FERRITIN PANEL
%SAT: 19 % (ref 16–45)
Ferritin: 27 ng/mL (ref 16–288)
Iron: 70 ug/dL (ref 45–160)
TIBC: 371 ug/dL (ref 250–450)

## 2023-08-10 ENCOUNTER — Ambulatory Visit (INDEPENDENT_AMBULATORY_CARE_PROVIDER_SITE_OTHER): Admitting: Internal Medicine

## 2023-08-10 ENCOUNTER — Encounter: Payer: Self-pay | Admitting: Internal Medicine

## 2023-08-10 VITALS — BP 122/72 | HR 81 | Temp 98.8°F | Ht 64.5 in | Wt 139.2 lb

## 2023-08-10 DIAGNOSIS — R0989 Other specified symptoms and signs involving the circulatory and respiratory systems: Secondary | ICD-10-CM | POA: Diagnosis not present

## 2023-08-10 DIAGNOSIS — E1169 Type 2 diabetes mellitus with other specified complication: Secondary | ICD-10-CM

## 2023-08-10 DIAGNOSIS — D509 Iron deficiency anemia, unspecified: Secondary | ICD-10-CM | POA: Diagnosis not present

## 2023-08-10 DIAGNOSIS — Z7984 Long term (current) use of oral hypoglycemic drugs: Secondary | ICD-10-CM

## 2023-08-10 DIAGNOSIS — H6122 Impacted cerumen, left ear: Secondary | ICD-10-CM

## 2023-08-10 DIAGNOSIS — N182 Chronic kidney disease, stage 2 (mild): Secondary | ICD-10-CM | POA: Diagnosis not present

## 2023-08-10 DIAGNOSIS — E785 Hyperlipidemia, unspecified: Secondary | ICD-10-CM

## 2023-08-10 DIAGNOSIS — E559 Vitamin D deficiency, unspecified: Secondary | ICD-10-CM | POA: Diagnosis not present

## 2023-08-10 DIAGNOSIS — E1122 Type 2 diabetes mellitus with diabetic chronic kidney disease: Secondary | ICD-10-CM

## 2023-08-10 DIAGNOSIS — L84 Corns and callosities: Secondary | ICD-10-CM

## 2023-08-10 DIAGNOSIS — F419 Anxiety disorder, unspecified: Secondary | ICD-10-CM

## 2023-08-10 LAB — CBC WITH DIFFERENTIAL/PLATELET
Basophils Absolute: 0 K/uL (ref 0.0–0.1)
Basophils Relative: 0.4 % (ref 0.0–3.0)
Eosinophils Absolute: 0 K/uL (ref 0.0–0.7)
Eosinophils Relative: 0.1 % (ref 0.0–5.0)
HCT: 39.8 % (ref 36.0–46.0)
Hemoglobin: 13.1 g/dL (ref 12.0–15.0)
Lymphocytes Relative: 21.1 % (ref 12.0–46.0)
Lymphs Abs: 0.9 K/uL (ref 0.7–4.0)
MCHC: 32.8 g/dL (ref 30.0–36.0)
MCV: 78.7 fl (ref 78.0–100.0)
Monocytes Absolute: 0.4 K/uL (ref 0.1–1.0)
Monocytes Relative: 8.4 % (ref 3.0–12.0)
Neutro Abs: 3.1 K/uL (ref 1.4–7.7)
Neutrophils Relative %: 70 % (ref 43.0–77.0)
Platelets: 305 K/uL (ref 150.0–400.0)
RBC: 5.06 Mil/uL (ref 3.87–5.11)
RDW: 13.5 % (ref 11.5–15.5)
WBC: 4.4 K/uL (ref 4.0–10.5)

## 2023-08-10 LAB — LIPID PANEL
Cholesterol: 281 mg/dL — ABNORMAL HIGH (ref 0–200)
HDL: 66.2 mg/dL (ref >39.00)
LDL Cholesterol: 185 mg/dL — ABNORMAL HIGH (ref 0–99)
NonHDL: 214.57
Total CHOL/HDL Ratio: 4
Triglycerides: 146 mg/dL (ref 0.0–149.0)
VLDL: 29.2 mg/dL (ref 0.0–40.0)

## 2023-08-10 LAB — COMPREHENSIVE METABOLIC PANEL WITH GFR
ALT: 15 U/L (ref 0–35)
AST: 14 U/L (ref 0–37)
Albumin: 4.1 g/dL (ref 3.5–5.2)
Alkaline Phosphatase: 93 U/L (ref 39–117)
BUN: 12 mg/dL (ref 6–23)
CO2: 27 meq/L (ref 19–32)
Calcium: 9 mg/dL (ref 8.4–10.5)
Chloride: 104 meq/L (ref 96–112)
Creatinine, Ser: 0.58 mg/dL (ref 0.40–1.20)
GFR: 82.4 mL/min (ref >60.00)
Glucose, Bld: 192 mg/dL — ABNORMAL HIGH (ref 70–99)
Potassium: 3.5 meq/L (ref 3.5–5.1)
Sodium: 139 meq/L (ref 135–145)
Total Bilirubin: 0.5 mg/dL (ref 0.2–1.2)
Total Protein: 6.9 g/dL (ref 6.0–8.3)

## 2023-08-10 LAB — HEMOGLOBIN A1C: Hgb A1c MFr Bld: 8.7 % — ABNORMAL HIGH (ref 4.6–6.5)

## 2023-08-10 LAB — VITAMIN D 25 HYDROXY (VIT D DEFICIENCY, FRACTURES): VITD: 50.05 ng/mL (ref 30.00–100.00)

## 2023-08-10 NOTE — Patient Instructions (Addendum)
 Go to pharmacy and get your Prevnar 20 vaccination . Bring a copy of it to your next appointment so we can update your records.    Ida Loader (ENT physician)   [facility]  [address]  [address]  [place], [place] [address]  [phone]

## 2023-08-10 NOTE — Progress Notes (Signed)
 [facility] [address] [place] [address] Dept: [phone] Dept Fax: [phone]    Subjective:   Stephanie Roach [birth year] 08/10/2023  Chief Complaint  Patient presents with   Follow-up    HPI: Stephanie Roach presents today for re-assessment and management of chronic medical conditions.  Discussed the use of AI scribe software for clinical note transcription with the patient, who gave verbal consent to proceed.  History of Present Illness   Stephanie Roach is an 88 year old female with hypertension, type 2 diabetes, and iron deficiency anemia who presents for a follow-up visit.She is not on any blood pressure medications and her most recent blood pressure reading was 122/72 mmHg.She has type 2 diabetes. She has not restarted metformin  due to concerns about renal effects, influenced by her daughter-in-law's experience. She takes 500 mg once daily when she does take it.For cholesterol management, she had stopped rosuvastatin  to see if she could manage without it.She takes 5000 units of vitamin D  daily for vitamin D  deficiency. She also takes an iron supplement for iron deficiency anemia and has a history of low blood counts. As a [employer]hov[employer]'s Witness, she does not accept blood transfusions.She has significant anxiety and takes Effexor  regularly and Ativan as needed, up to twice a day. She inquires about the addictive nature of Ativan.She experiences dry skin and calluses on her feet, which she manages by removing skin, sometimes causing bleeding. She has had bunion surgery and is interested in foot care to manage calluses and prevent complications due to diabetes.She experiences hearing loss in her left ear recently, which she attributes to ear wax buildup.       BP Readings from Last 3 Encounters:  08/10/23 122/72  04/13/23 116/72  12/09/22 128/82   Lab Results  Component Value Date   HGBA1C 8.1 (H) 04/13/2023   HGBA1C  7.5 (H) 12/09/2022   HGBA1C 7.0 (H) 09/07/2022   Last lipids Lab Results  Component Value Date   CHOL 194 04/13/2023   HDL 71.60 04/13/2023   LDLCALC 93 04/13/2023   TRIG 148.0 04/13/2023   CHOLHDL 3 04/13/2023     The following portions of the patient's history were reviewed and updated as appropriate: past medical history, past surgical history, family history, social history, allergies, medications, and problem list.   Patient Active Problem List   Diagnosis Date Noted   CKD stage 2 due to type 2 diabetes mellitus (HCC) 02/16/2022   Grief reaction 04/02/2020   S/P lumpectomy, right breast 09/17/2019   Medication management 02/18/2015   Hiatal hernia 02/18/2015   Abnormal glucose    Hyperlipidemia associated with type 2 diabetes mellitus (HCC)    Labile hypertension    Vitamin D  deficiency    Hx of adenomatous colonic polyps 08/11/2010   Iron deficiency anemia 08/11/2010   Chronic anxiety 08/11/2010   Past Medical History:  Diagnosis Date   Allergic rhinitis    Anemia    Anxiety    Arthritis    Depression    GERD (gastroesophageal reflux disease)    Hyperlipidemia    Hypertension    Migraine    Prediabetes    Sleep apnea    no cpap   Vitamin D  deficiency    Past Surgical History:  Procedure Laterality Date   BREAST BIOPSY Right 10/19/2018   FIBROCYSTIC CHANGE WITH USUAL DUCTAL HYPERPLASIA,   BREAST EXCISIONAL BIOPSY Right 07/05/2019   Negative for in situ or invasive carcinoma  BREAST LUMPECTOMY WITH RADIOACTIVE SEED LOCALIZATION Right 07/05/2019   Procedure: RIGHT BREAST LUMPECTOMY WITH RADIOACTIVE SEED LOCALIZATION;  Surgeon: Vernetta Berg, MD;  Location: [facility] OR;  Service: General;  Laterality: Right;   BUNIONECTOMY     CATARACT EXTRACTION Bilateral    CHOLECYSTECTOMY  2003   KNEE ARTHROSCOPY     WRIST FRACTURE SURGERY     Family History  Problem Relation Age of Onset   Colon cancer Father    Stroke Mother    Breast cancer Cousin    Pancreatic  cancer Paternal Aunt    Ulcerative colitis Brother    Colon polyps Maternal Aunt     Current Outpatient Medications:    Cholecalciferol (VITAMIN D3) 125 MCG (5000 UT) CAPS, Take by mouth., Disp: , Rfl:    LORazepam (ATIVAN) 1 MG tablet, Take 1 mg by mouth 2 (two) times daily., Disp: , Rfl:    omeprazole  (PRILOSEC) 20 MG capsule, Take by mouth., Disp: , Rfl:    traZODone  (DESYREL ) 50 MG tablet, Take  1 to 2 tablets  1 hour  before Bedtime as needed for Sleep, Disp: 60 tablet, Rfl: 0   venlafaxine  XR (EFFEXOR -XR) 150 MG 24 hr capsule, Take 150 mg by mouth at bedtime., Disp: , Rfl:    Blood Glucose Monitoring Suppl (ACCU-CHEK GUIDE ME) w/Device KIT, USE TO CHECK BLOOD SUGAR ONCE DAILY OR AS DIRECTED. (Patient not taking: Reported on 04/13/2023), Disp: 1 kit, Rfl: 0   Blood Glucose Monitoring Suppl (ACCU-CHEK GUIDE) w/Device KIT, USE TO CHECK BLOOD SUGAR ONCE DAILY OR AS DIRECTED. (Patient not taking: Reported on 04/13/2023), Disp: 1 kit, Rfl: 0   Cyanocobalamin  (VITAMIN B-12 PO), Take by mouth. Sometimes (Patient not taking: Reported on 08/10/2023), Disp: , Rfl:    diclofenac  Sodium (VOLTAREN ) 1 % GEL, Apply 2 g topically 4 (four) times daily as needed. (Patient not taking: Reported on 08/10/2023), Disp: 100 g, Rfl: 2   Fe Fum-FePoly-FA-Vit C-Vit B3 (FOLIVANE-F) 125-1 MG CAPS, Take 1 capsule three times a day with meals. (Patient not taking: Reported on 08/10/2023), Disp: 180 capsule, Rfl: 0   Fe Fum-FePoly-FA-Vit C-Vit B3 (FOLIVANE-F) 125-1 MG CAPS, Take 1 capsule Daily (Patient not taking: Reported on 04/13/2023), Disp: 90 capsule, Rfl: 1   glucose blood test strip, Use to check blood sugar once daily or as directed. (Patient not taking: Reported on 04/13/2023), Disp: 100 each, Rfl: 12   Lancets MISC, Use to check blood sugar once daily or as directed. (Patient not taking: Reported on 04/13/2023), Disp: 200 each, Rfl: 12   metFORMIN  (GLUCOPHAGE ) 500 MG tablet, Take 1 tablet (500 mg total) by mouth daily  with breakfast. (Patient not taking: Reported on 08/10/2023), Disp: 30 tablet, Rfl: 11   rosuvastatin  (CRESTOR ) 5 MG tablet, Take 1 tablet (5 mg total) by mouth daily. (Patient not taking: Reported on 08/10/2023), Disp: 90 tablet, Rfl: 3 Allergies  Allergen Reactions   Morphine And Codeine     Unknown      ROS: A complete ROS was performed with pertinent positives/negatives noted in the HPI. The remainder of the ROS are negative.    Objective:   Today's Vitals   08/10/23 1035  BP: 122/72  Pulse: 81  Temp: 98.8 F (37.1 C)  TempSrc: Temporal  SpO2: 96%  Weight: 139 lb 3.2 oz (63.1 kg)  Height: 5' 4.5 (1.638 m)    GENERAL: Well-appearing, in NAD. Well nourished.  SKIN: Pink, warm and dry. No rash, lesion, ulceration, or ecchymoses.  Calluses to  plantar surfaces of bilateral feet HEENT:    HEAD: Normocephalic, non-traumatic.  EYES: Conjunctive pink without exudate. PERRL, EOMI.  EARS: External ear w/o redness, swelling, masses, or lesions. EAC clear. Right TM intact, translucent w/o bulging, appropriate landmarks visualized. Ceruminosis impaction is noted to left ear.  Patient declines wax removal at PCP office.  NECK: Trachea midline. Full ROM w/o pain or tenderness. No lymphadenopathy.  RESPIRATORY: Chest wall symmetrical. Respirations even and non-labored. Breath sounds clear to auscultation bilaterally.  CARDIAC: S1, S2 present, regular rate and rhythm. Peripheral pulses 2+ bilaterally.  EXTREMITIES: Without clubbing, cyanosis, or edema.  NEUROLOGIC:  Steady, even gait. Sensory exam of the foot is normal, tested with the monofilament. Good pulses, no lesions or ulcers, good peripheral pulses. PSYCH/MENTAL STATUS: Alert, oriented x 3. Cooperative, appropriate mood and affect.   Health Maintenance Due  Topic Date Due   Medicare Annual Wellness (AWV)  05/26/2023    No results found for any visits on 08/10/23.  The ASCVD Risk score (Arnett DK, et al., 2019) failed to  calculate for the following reasons:   The 2019 ASCVD risk score is only valid for ages 76 to 55     Assessment & Plan:  Assessment and Plan    Anxiety Chronic anxiety managed with Effexor  and Ativan - Continue Effexor  XR 150mg  as prescribed. - Continue Ativan as needed, up to twice a day.  Type 2 Diabetes Mellitus A1c previously increased. Patient did not restart metformin  as previously instructed from prior office visit. Alternatives discussed considering cost and hypoglycemia risk. - Check A1c level. - Advised restart metformin  XR 500 mg once daily. - refer to podiatry for routine foot care  Chronic Kidney Disease Stage 2, likely secondary to diabetes. Monitored regularly. - Check CMP  Hyperlipidemia Resuming rosuvastatin  recommended for cholesterol reduction and cardiovascular risk mitigation. - Check cholesterol levels. - Resume rosuvastatin .  Hypertension Blood pressure well-controlled at 122/72 mmHg without medications. - Monitor blood pressure regularly.  Iron Deficiency Anemia On iron supplementation. Blood counts monitored to assess anemia status. - Check CBC  Vitamin D  Deficiency On 5000 units of vitamin D  daily. Levels checked to ensure adequate supplementation. - Check vitamin D  level.  Foot Calluses - Refer to podiatrist for foot care.  Cerumen Impaction Hearing loss in left ear likely due to cerumen impaction.  - Offered to remove in PCP office today at visit, patient declined. She states she will call ENT office for removal.   General Health Maintenance Due for Prevnar 20 vaccine. Available at pharmacy and covered by insurance. - Advise to receive Prevnar 20 vaccine at pharmacy and bring documentation to update records.   Orders Placed This Encounter  Procedures   VITAMIN D  25 Hydroxy (Vit-D Deficiency, Fractures)   Hemoglobin A1C   Comp Met (CMET)   Lipid panel   CBC with Differential/Platelet   Ambulatory referral to Podiatry    Referral  Priority:   Routine    Referral Type:   Consultation    Referral Reason:   Specialty Services Required    Requested Specialty:   Podiatry    Number of Visits Requested:   1   No images are attached to the encounter or orders placed in the encounter. No orders of the defined types were placed in this encounter.   Return in about 4 months (around 12/11/2023) for Diabetes, Cholesterol, Hypertension, Vitamin D  deficiency, Anemia, Anxiety- lab work at visit.   Rosina Senters, FNP

## 2023-08-12 ENCOUNTER — Ambulatory Visit: Payer: Self-pay | Admitting: Internal Medicine

## 2023-10-03 ENCOUNTER — Ambulatory Visit (INDEPENDENT_AMBULATORY_CARE_PROVIDER_SITE_OTHER)

## 2023-10-03 VITALS — BP 122/66 | HR 79 | Temp 97.7°F | Ht 64.5 in | Wt 140.4 lb

## 2023-10-03 DIAGNOSIS — Z0000 Encounter for general adult medical examination without abnormal findings: Secondary | ICD-10-CM

## 2023-10-03 NOTE — Patient Instructions (Signed)
 Ms. Stephanie Roach,  Thank you for taking the time for your Medicare Wellness Visit. I appreciate your continued commitment to your health goals. Please review the care plan we discussed, and feel free to reach out if I can assist you further.  Medicare recommends these wellness visits once per year to help you and your care team stay ahead of potential health issues. These visits are designed to focus on prevention, allowing your provider to concentrate on managing your acute and chronic conditions during your regular appointments.  Please note that Annual Wellness Visits do not include a physical exam. Some assessments may be limited, especially if the visit was conducted virtually. If needed, we may recommend a separate in-person follow-up with your provider.  Ongoing Care Seeing your primary care provider every 3 to 6 months helps us  monitor your health and provide consistent, personalized care.   Referrals If a referral was made during today's visit and you haven't received any updates within two weeks, please contact the referred provider directly to check on the status.  Recommended Screenings:  Health Maintenance  Topic Date Due   Flu Shot  08/26/2023   COVID-19 Vaccine (5 - 2025-26 season) 09/26/2023   Eye exam for diabetics  02/10/2024*   Zoster (Shingles) Vaccine (1 of 2) 02/10/2024*   Hemoglobin A1C  02/10/2024   Yearly kidney health urinalysis for diabetes  04/12/2024   Yearly kidney function blood test for diabetes  08/09/2024   Complete foot exam   08/09/2024   Medicare Annual Wellness Visit  10/02/2024   Pneumococcal Vaccine for age over 91  Completed   DEXA scan (bone density measurement)  Completed   HPV Vaccine  Aged Out   Meningitis B Vaccine  Aged Out   DTaP/Tdap/Td vaccine  Discontinued   Colon Cancer Screening  Discontinued  *Topic was postponed. The date shown is not the original due date.       10/03/2023   11:43 AM  Advanced Directives  Does Patient Have a  Medical Advance Directive? Yes  Type of Estate agent of [place];Living will  Copy of Healthcare Power of Attorney in Chart? No - copy requested   Advance Care Planning is important because it: Ensures you receive medical care that aligns with your values, goals, and preferences. Provides guidance to your family and loved ones, reducing the emotional burden of decision-making during critical moments.  Vision: Annual vision screenings are recommended for early detection of glaucoma, cataracts, and diabetic retinopathy. These exams can also reveal signs of chronic conditions such as diabetes and high blood pressure.  Dental: Annual dental screenings help detect early signs of oral cancer, gum disease, and other conditions linked to overall health, including heart disease and diabetes.  Please see the attached documents for additional preventive care recommendations.

## 2023-10-03 NOTE — Progress Notes (Signed)
 Subjective:   Stephanie Roach is a 88 y.o. who presents for a Medicare Wellness preventive visit.  As a reminder, Annual Wellness Visits don't include a physical exam, and some assessments may be limited, especially if this visit is performed virtually. We may recommend an in-person follow-up visit with your provider if needed.  Visit Complete: In person    Persons Participating in Visit: Patient.  AWV Questionnaire: No: Patient Medicare AWV questionnaire was not completed prior to this visit.  Cardiac Risk Factors include: advanced age (>25men, >87 women);diabetes mellitus;hypertension     Objective:    Today's Vitals   10/03/23 1132  BP: 122/66  Pulse: 79  Temp: 97.7 F (36.5 C)  TempSrc: Oral  SpO2: 96%  Weight: 140 lb 6.4 oz (63.7 kg)  Height: 5' 4.5 (1.638 m)   Body mass index is 23.73 kg/m.     10/03/2023   11:43 AM 05/26/2022   11:19 AM 10/28/2021   12:03 PM 04/16/2021   11:31 AM 09/19/2019   12:11 PM 06/28/2019   11:29 AM 09/14/2018   11:47 AM  Advanced Directives  Does Patient Have a Medical Advance Directive? Yes Yes Yes Yes Yes No No;Yes  Type of Estate agent of [place];Living will Living will;Healthcare Power of [employer] Power of [place];Living will Healthcare Power of [place];Living will   Healthcare Power of [place];Living will  Does patient want to make changes to medical advance directive?  No - Patient declined No - Patient declined No - Patient declined No - Patient declined  No - Patient declined   Copy of Healthcare Power of Attorney in Chart? No - copy requested No - copy requested No - copy requested No - copy requested        Data saved with a previous flowsheet row definition    Current Medications (verified) Outpatient Encounter Medications as of 10/03/2023  Medication Sig   Cholecalciferol (VITAMIN D3) 125 MCG (5000 UT) CAPS Take by mouth.   LORazepam (ATIVAN) 1 MG tablet Take 1 mg by mouth 2 (two) times  daily.   omeprazole  (PRILOSEC) 20 MG capsule Take by mouth.   rosuvastatin  (CRESTOR ) 5 MG tablet Take 1 tablet (5 mg total) by mouth daily.   traZODone  (DESYREL ) 50 MG tablet Take  1 to 2 tablets  1 hour  before Bedtime as needed for Sleep   venlafaxine  XR (EFFEXOR -XR) 150 MG 24 hr capsule Take 150 mg by mouth at bedtime.   Blood Glucose Monitoring Suppl (ACCU-CHEK GUIDE ME) w/Device KIT USE TO CHECK BLOOD SUGAR ONCE DAILY OR AS DIRECTED. (Patient not taking: Reported on 10/03/2023)   Blood Glucose Monitoring Suppl (ACCU-CHEK GUIDE) w/Device KIT USE TO CHECK BLOOD SUGAR ONCE DAILY OR AS DIRECTED. (Patient not taking: Reported on 10/03/2023)   Cyanocobalamin  (VITAMIN B-12 PO) Take by mouth. Sometimes (Patient not taking: Reported on 10/03/2023)   diclofenac  Sodium (VOLTAREN ) 1 % GEL Apply 2 g topically 4 (four) times daily as needed. (Patient not taking: Reported on 10/03/2023)   Fe Fum-FePoly-FA-Vit C-Vit B3 (FOLIVANE-F) 125-1 MG CAPS Take 1 capsule three times a day with meals. (Patient not taking: Reported on 10/03/2023)   Fe Fum-FePoly-FA-Vit C-Vit B3 (FOLIVANE-F) 125-1 MG CAPS Take 1 capsule Daily (Patient not taking: Reported on 10/03/2023)   glucose blood test strip Use to check blood sugar once daily or as directed. (Patient not taking: Reported on 10/03/2023)   Lancets MISC Use to check blood sugar once daily or as directed. (Patient not taking:  Reported on 04/13/2023)   metFORMIN  (GLUCOPHAGE ) 500 MG tablet Take 1 tablet (500 mg total) by mouth daily with breakfast. (Patient not taking: Reported on 08/10/2023)   No facility-administered encounter medications on file as of 10/03/2023.    Allergies (verified) Morphine and codeine   History: Past Medical History:  Diagnosis Date   Allergic rhinitis    Anemia    Anxiety    Arthritis    Depression    GERD (gastroesophageal reflux disease)    Hyperlipidemia    Hypertension    Migraine    Prediabetes    Sleep apnea    no cpap   Vitamin D   deficiency    Past Surgical History:  Procedure Laterality Date   BREAST BIOPSY Right 10/19/2018   FIBROCYSTIC CHANGE WITH USUAL DUCTAL HYPERPLASIA,   BREAST EXCISIONAL BIOPSY Right 07/05/2019   Negative for in situ or invasive carcinoma   BREAST LUMPECTOMY WITH RADIOACTIVE SEED LOCALIZATION Right 07/05/2019   Procedure: RIGHT BREAST LUMPECTOMY WITH RADIOACTIVE SEED LOCALIZATION;  Surgeon: Vernetta Berg, MD;  Location: [facility] OR;  Service: General;  Laterality: Right;   BUNIONECTOMY     CATARACT EXTRACTION Bilateral    CHOLECYSTECTOMY  2003   KNEE ARTHROSCOPY     WRIST FRACTURE SURGERY     Family History  Problem Relation Age of Onset   Colon cancer Father    Stroke Mother    Breast cancer Cousin    Pancreatic cancer Paternal Aunt    Ulcerative colitis Brother    Colon polyps Maternal Aunt    Social History   Socioeconomic History   Marital status: Widowed    Spouse name: Not on file   Number of children: 3   Years of education: Not on file   Highest education level: Not on file  Occupational History   Occupation: Retired  Tobacco Use   Smoking status: Never   Smokeless tobacco: Never  Vaping Use   Vaping status: Never Used  Substance and Sexual Activity   Alcohol use: No   Drug use: No   Sexual activity: Not on file  Other Topics Concern   Not on file  Social History Narrative   Jehovah's Witness- NO BLOOD PRODUCTS   Social Drivers of Health   Financial Resource Strain: Low Risk  (10/03/2023)   Overall Financial Resource Strain (CARDIA)    Difficulty of Paying Living Expenses: Not hard at all  Food Insecurity: No Food Insecurity (10/03/2023)   Hunger Vital Sign    Worried About Running Out of Food in the Last Year: Never true    Ran Out of Food in the Last Year: Never true  Transportation Needs: No Transportation Needs (10/03/2023)   PRAPARE - Administrator, Civil Service (Medical): No    Lack of Transportation (Non-Medical): No  Physical Activity:  Insufficiently Active (10/03/2023)   Exercise Vital Sign    Days of Exercise per Week: 2 days    Minutes of Exercise per Session: 10 min  Stress: Stress Concern Present (10/03/2023)   [employer] of Occupational Health - Occupational Stress Questionnaire    Feeling of Stress: To some extent  Social Connections: Moderately Isolated (10/03/2023)   Social Connection and Isolation Panel    Frequency of Communication with Friends and Family: Three times a week    Frequency of Social Gatherings with Friends and Family: Never    Attends Religious Services: More than 4 times per year    Active Member of Clubs or Organizations: No  Attends Banker Meetings: Never    Marital Status: Widowed    Tobacco Counseling Counseling given: Not Answered    Clinical Intake:  Pre-visit preparation completed: Yes  Pain : No/denies pain     Nutritional Status: BMI of 19-24  Normal Nutritional Risks: None Diabetes: Yes CBG done?: No Did pt. bring in CBG monitor from home?: No  Lab Results  Component Value Date   HGBA1C 8.7 (H) 08/10/2023   HGBA1C 8.1 (H) 04/13/2023   HGBA1C 7.5 (H) 12/09/2022     How often do you need to have someone help you when you read instructions, pamphlets, or other written materials from your doctor or pharmacy?: 1 - Never  Interpreter Needed?: No  Information entered by :: NAllen LPN   Activities of Daily Living     10/03/2023   11:36 AM  In your present state of health, do you have any difficulty performing the following activities:  Hearing? 0  Comment decreased hearing left ear  Vision? 0  Difficulty concentrating or making decisions? 0  Walking or climbing stairs? 0  Dressing or bathing? 0  Doing errands, shopping? 0  Preparing Food and eating ? N  Using the Toilet? N  In the past six months, have you accidently leaked urine? N  Do you have problems with loss of bowel control? N  Managing your Medications? N  Managing your Finances?  N  Housekeeping or managing your Housekeeping? N    Patient Care Team: Billy Knee, FNP as PCP - General (Internal Medicine) Obie Princella HERO, MD (Inactive) as Consulting Physician (Gastroenterology) Tasia Lung, MD as Consulting Physician (Psychiatry) Sherrod Sherrod, MD as Consulting Physician (Oncology) Yvone Katz, MD as Referring Physician (Orthopedic Surgery) Murrell Kuba, MD as Consulting Physician (Orthopedic Surgery)  I have updated your Care Teams any recent Medical Services you may have received from other providers in the past year.     Assessment:   This is a routine wellness examination for Lauretta.  Hearing/Vision screen Hearing Screening - Comments:: Decreased hearing in left ear, no hearing aids Vision Screening - Comments:: Regular eye exams, [facility]   Goals Addressed             This Visit's Progress    Patient Stated       10/03/2023, does not want to lose weight       Depression Screen     10/03/2023   11:45 AM 08/10/2023   10:44 AM 04/13/2023   10:54 AM 05/26/2022   11:20 AM 10/28/2021   12:04 PM 07/20/2021   11:38 AM 04/16/2021   11:33 AM  PHQ 2/9 Scores  PHQ - 2 Score 2 2 2 2 4  0 6  PHQ- 9 Score 4 6 7 6 7  11     Fall Risk     10/03/2023   11:44 AM 08/10/2023   10:34 AM 04/13/2023   10:53 AM 05/26/2022   11:20 AM 10/28/2021   12:04 PM  Fall Risk   Falls in the past year? 0 0 0 0 0  Number falls in past yr: 0 0 0 0 0  Injury with Fall? 0 0 0 0 0  Risk for fall due to : Medication side effect No Fall Risks No Fall Risks Impaired balance/gait Impaired balance/gait  Follow up Falls evaluation completed;Falls prevention discussed Falls prevention discussed Falls prevention discussed Falls evaluation completed;Falls prevention discussed Falls prevention discussed;Falls evaluation completed      Data saved with a previous  flowsheet row definition    MEDICARE RISK AT HOME:  Medicare Risk at Home Any stairs in or around the home?: No If  so, are there any without handrails?: No Home free of loose throw rugs in walkways, pet beds, electrical cords, etc?: Yes Adequate lighting in your home to reduce risk of falls?: Yes Life alert?: No Use of a cane, walker or w/c?: No Grab bars in the bathroom?: No Shower chair or bench in shower?: No Elevated toilet seat or a handicapped toilet?: No  TIMED UP AND GO:  Was the test performed?  Yes  Length of time to ambulate 10 feet: 5 sec Gait steady and fast without use of assistive device  Cognitive Function: 6CIT completed    02/17/2022   10:36 AM  MMSE - Mini Mental State Exam  Orientation to time 5  Orientation to Place 5  Registration 3  Attention/ Calculation 5  Recall 3  Language- name 2 objects 2  Language- repeat 1  Language- follow 3 step command 3  Language- read & follow direction 1  Write a sentence 1  Copy design 1  Total score 30        10/03/2023   11:47 AM  6CIT Screen  What Year? 0 points  What month? 0 points  What time? 0 points  Count back from 20 0 points  Months in reverse 0 points  Repeat phrase 2 points  Total Score 2 points    Immunizations Immunization History  Administered Date(s) Administered   INFLUENZA, HIGH DOSE SEASONAL PF 12/29/2018, 01/01/2021, 10/28/2021   PFIZER Comirnaty(Gray Top)Covid-19 Tri-Sucrose Vaccine 08/21/2020   PFIZER(Purple Top)SARS-COV-2 Vaccination 05/07/2019, 05/30/2019, 12/08/2019   Pneumococcal Conjugate-13 08/27/2013   Pneumococcal Polysaccharide-23 10/23/2015   Pneumococcal-Unspecified 12/06/2001   Tdap 08/05/2012   Zoster, Live 02/05/2010    Screening Tests Health Maintenance  Topic Date Due   Influenza Vaccine  08/26/2023   COVID-19 Vaccine (5 - 2025-26 season) 09/26/2023   OPHTHALMOLOGY EXAM  02/10/2024 (Originally 06/03/2023)   Zoster Vaccines- Shingrix (1 of 2) 02/10/2024 (Originally 12/21/1954)   HEMOGLOBIN A1C  02/10/2024   Diabetic kidney evaluation - Urine ACR  04/12/2024   Diabetic kidney  evaluation - eGFR measurement  08/09/2024   FOOT EXAM  08/09/2024   Medicare Annual Wellness (AWV)  10/02/2024   Pneumococcal Vaccine: 50+ Years  Completed   DEXA SCAN  Completed   HPV VACCINES  Aged Out   Meningococcal B Vaccine  Aged Out   DTaP/Tdap/Td  Discontinued   Colonoscopy  Discontinued    Health Maintenance Items Addressed: Declines flu and covid vaccines.  Additional Screening:  Vision Screening: Recommended annual ophthalmology exams for early detection of glaucoma and other disorders of the eye. Is the patient up to date with their annual eye exam?  No  Who is the provider or what is the name of the office in which the patient attends annual eye exams? [facility]  Dental Screening: Recommended annual dental exams for proper oral hygiene  Community Resource Referral / Chronic Care Management: CRR required this visit?  No   CCM required this visit?  No   Plan:    I have personally reviewed and noted the following in the patient's chart:   Medical and social history Use of alcohol, tobacco or illicit drugs  Current medications and supplements including opioid prescriptions. Patient is not currently taking opioid prescriptions. Functional ability and status Nutritional status Physical activity Advanced directives List of other physicians Hospitalizations, surgeries, and  ER visits in previous 12 months Vitals Screenings to include cognitive, depression, and falls Referrals and appointments  In addition, I have reviewed and discussed with patient certain preventive protocols, quality metrics, and best practice recommendations. A written personalized care plan for preventive services as well as general preventive health recommendations were provided to patient.   Ardella FORBES Dawn, LPN   0/01/7972   After Visit Summary: (In Person-Printed) AVS printed and given to the patient  Notes: Nothing significant to report at this time.

## 2023-10-28 ENCOUNTER — Other Ambulatory Visit: Payer: Self-pay | Admitting: Internal Medicine

## 2023-10-28 DIAGNOSIS — Z1231 Encounter for screening mammogram for malignant neoplasm of breast: Secondary | ICD-10-CM

## 2023-11-09 ENCOUNTER — Ambulatory Visit
Admission: RE | Admit: 2023-11-09 | Discharge: 2023-11-09 | Disposition: A | Discharge status: Home / Self Care | Source: Ambulatory Visit | Attending: Internal Medicine | Admitting: Internal Medicine

## 2023-11-09 DIAGNOSIS — Z1231 Encounter for screening mammogram for malignant neoplasm of breast: Secondary | ICD-10-CM

## 2023-11-15 ENCOUNTER — Other Ambulatory Visit: Payer: Self-pay | Admitting: Internal Medicine

## 2023-11-15 ENCOUNTER — Ambulatory Visit: Payer: Self-pay | Admitting: Internal Medicine

## 2023-11-15 DIAGNOSIS — R928 Other abnormal and inconclusive findings on diagnostic imaging of breast: Secondary | ICD-10-CM

## 2023-11-15 NOTE — Telephone Encounter (Signed)
 Copied from CRM #[id number]. Topic: Clinical - Lab/Test Results >> Nov 15, 2023  4:38 PM Stephanie Roach wrote: Patient requested call back regarding mammogram results, says no one called her and she wants to know why she has to repeat the mammogram 10/30. She nervous/anxious and requested a call back today at [phone] (H)   Contacted patient back to inform her not to worry and nurse rosina needs clearer imaging to do a final resulting. Patient expressed understanding NFN

## 2023-11-24 ENCOUNTER — Ambulatory Visit
Admission: RE | Admit: 2023-11-24 | Discharge: 2023-11-24 | Disposition: A | Discharge status: Home / Self Care | Source: Ambulatory Visit | Attending: Internal Medicine | Admitting: Internal Medicine

## 2023-11-24 DIAGNOSIS — R928 Other abnormal and inconclusive findings on diagnostic imaging of breast: Secondary | ICD-10-CM

## 2023-11-29 ENCOUNTER — Ambulatory Visit: Payer: Self-pay | Admitting: Internal Medicine

## 2023-11-29 DIAGNOSIS — N6002 Solitary cyst of left breast: Secondary | ICD-10-CM | POA: Insufficient documentation

## 2023-11-30 NOTE — Telephone Encounter (Signed)
 Spoke with pt she understood result given and had no question or concerns.

## 2023-12-06 ENCOUNTER — Ambulatory Visit: Payer: Self-pay

## 2023-12-06 NOTE — Telephone Encounter (Signed)
 Patient has appointment on 12/07/2023 at 2:20 PM.  Dm/cma

## 2023-12-06 NOTE — Telephone Encounter (Signed)
  FYI Only or Action Required?: FYI only for provider: appointment scheduled on 12/07/2023.  Patient was last seen in primary care on 08/10/2023 by Billy Knee, FNP.  Called Nurse Triage reporting Vaginal Itching.  Symptoms began a week ago.  Interventions attempted: Nothing.  Symptoms are: gradually worsening.  Triage Disposition: See Physician Within 24 Hours  Patient/caregiver understands and will follow disposition?: Yes Copied from CRM #[id number]. Topic: Clinical - Red Word Triage >> Dec 06, 2023  2:08 PM Robinson H wrote: [facility] that prompted transfer to Nurse Triage: Frequent urination, itching, burning, and hurt white balls or something down in vaginal Reason for Disposition  MODERATE-SEVERE itching (i.e., interferes with school, work, or sleep)  Answer Assessment - Initial Assessment Questions 1. SYMPTOM: What's the main symptom you're concerned about? (e.g., pain, itching, dryness)     itching 2. LOCATION: Where is the  itching located? (e.g., inside/outside, left/right)     Inside and outside of vagina 3. ONSET: When did the  symptoms  start?     One week ago 4. PAIN: Is there any pain? If Yes, ask: How bad is it? (Scale: 1-10; mild, moderate, severe)     denies 5. ITCHING: Is there any itching? If Yes, ask: How bad is it? (Scale: 1-10; mild, moderate, severe)     10/10 itching 6. CAUSE: What do you think is causing the discharge? Have you had the same problem before? What happened then?     Yeast infection 7. OTHER SYMPTOMS: Do you have any other symptoms? (e.g., fever, itching, vaginal bleeding, pain with urination, injury to genital area, vaginal foreign body)     White discharge  Protocols used: Vaginal Symptoms-A-AH

## 2023-12-07 ENCOUNTER — Ambulatory Visit: Admitting: Internal Medicine

## 2023-12-07 ENCOUNTER — Encounter: Payer: Self-pay | Admitting: Internal Medicine

## 2023-12-07 VITALS — BP 122/66 | HR 83 | Temp 97.8°F | Ht 65.0 in | Wt 141.4 lb

## 2023-12-07 DIAGNOSIS — B3731 Acute candidiasis of vulva and vagina: Secondary | ICD-10-CM

## 2023-12-07 DIAGNOSIS — R300 Dysuria: Secondary | ICD-10-CM

## 2023-12-07 LAB — POC URINALSYSI DIPSTICK (AUTOMATED)
Bilirubin, UA: NEGATIVE
Glucose, UA: POSITIVE — AB
Ketones, UA: NEGATIVE
Leukocytes, UA: NEGATIVE
Nitrite, UA: NEGATIVE
Protein, UA: NEGATIVE
Spec Grav, UA: 1.015 (ref 1.010–1.025)
Urobilinogen, UA: 0.2 U/dL
pH, UA: 5.5 (ref 5.0–8.0)

## 2023-12-07 MED ORDER — FLUCONAZOLE 150 MG PO TABS: ORAL_TABLET | ORAL | 0 refills | Status: DC

## 2023-12-07 NOTE — Progress Notes (Signed)
 [facility] [address] [place] [address] Dept: [phone] Dept Fax: [phone]  Acute Care Office Visit  Subjective:   Stephanie Roach 05/10/1935 12/07/2023  Chief Complaint  Patient presents with   Urinary Tract Infection    1 week ago burning, itching    HPI:  Discussed the use of AI scribe software for clinical note transcription with the patient, who gave verbal consent to proceed.  History of Present Illness    She has been experiencing severe itching and burning during urination for over a week. There is an urgency to urinate. She reports no pain around the bladder or unusual odor in the urine.  She describes a damp sensation and has observed a sticky white discharge in her vaginal area. She has not been on any antibiotics recently and has not tried any over-the-counter treatments for her symptoms.  No fever, malodorous urine, or blood in the urine is reported. She changes her underwear four to five times a day to manage the dampness.       The following portions of the patient's history were reviewed and updated as appropriate: past medical history, past surgical history, family history, social history, allergies, medications, and problem list.   Patient Active Problem List   Diagnosis Date Noted   Breast cyst, left 11/29/2023   CKD stage 2 due to type 2 diabetes mellitus (HCC) 02/16/2022   Grief reaction 04/02/2020   S/P lumpectomy, right breast 09/17/2019   Medication management 02/18/2015   Hiatal hernia 02/18/2015   Abnormal glucose    Hyperlipidemia associated with type 2 diabetes mellitus (HCC)    Labile hypertension    Vitamin D  deficiency    Hx of adenomatous colonic polyps 08/11/2010   Iron deficiency anemia 08/11/2010   Chronic anxiety 08/11/2010   Past Medical History:  Diagnosis Date   Allergic rhinitis    Anemia    Anxiety    Arthritis    Depression    GERD  (gastroesophageal reflux disease)    Hyperlipidemia    Hypertension    Migraine    Prediabetes    Sleep apnea    no cpap   Vitamin D  deficiency    Past Surgical History:  Procedure Laterality Date   BREAST BIOPSY Right 10/19/2018   FIBROCYSTIC CHANGE WITH USUAL DUCTAL HYPERPLASIA,   BREAST EXCISIONAL BIOPSY Right 07/05/2019   Negative for in situ or invasive carcinoma   BREAST LUMPECTOMY WITH RADIOACTIVE SEED LOCALIZATION Right 07/05/2019   Procedure: RIGHT BREAST LUMPECTOMY WITH RADIOACTIVE SEED LOCALIZATION;  Surgeon: Vernetta Berg, MD;  Location: [facility] OR;  Service: General;  Laterality: Right;   BUNIONECTOMY     CATARACT EXTRACTION Bilateral    CHOLECYSTECTOMY  2003   KNEE ARTHROSCOPY     WRIST FRACTURE SURGERY     Family History  Problem Relation Age of Onset   Colon cancer Father    Stroke Mother    Breast cancer Cousin    Pancreatic cancer Paternal Aunt    Ulcerative colitis Brother    Colon polyps Maternal Aunt     Current Outpatient Medications:    Cholecalciferol (VITAMIN D3) 125 MCG (5000 UT) CAPS, Take by mouth., Disp: , Rfl:    fluconazole (DIFLUCAN) 150 MG tablet, Take 1 tablet by mouth once. Repeat dose in 3 days if symptoms persist., Disp: 2 tablet, Rfl: 0   LORazepam (ATIVAN) 1 MG tablet, Take 1 mg by mouth 2 (two) times daily., Disp: , Rfl:  omeprazole  (PRILOSEC) 20 MG capsule, Take by mouth., Disp: , Rfl:    rosuvastatin  (CRESTOR ) 5 MG tablet, Take 1 tablet (5 mg total) by mouth daily., Disp: 90 tablet, Rfl: 3   traZODone  (DESYREL ) 50 MG tablet, Take  1 to 2 tablets  1 hour  before Bedtime as needed for Sleep, Disp: 60 tablet, Rfl: 0   venlafaxine  XR (EFFEXOR -XR) 150 MG 24 hr capsule, Take 150 mg by mouth at bedtime., Disp: , Rfl:    Blood Glucose Monitoring Suppl (ACCU-CHEK GUIDE ME) w/Device KIT, USE TO CHECK BLOOD SUGAR ONCE DAILY OR AS DIRECTED. (Patient not taking: Reported on 10/03/2023), Disp: 1 kit, Rfl: 0   Blood Glucose Monitoring Suppl  (ACCU-CHEK GUIDE) w/Device KIT, USE TO CHECK BLOOD SUGAR ONCE DAILY OR AS DIRECTED. (Patient not taking: Reported on 10/03/2023), Disp: 1 kit, Rfl: 0   Cyanocobalamin  (VITAMIN B-12 PO), Take by mouth. Sometimes (Patient not taking: Reported on 10/03/2023), Disp: , Rfl:    diclofenac  Sodium (VOLTAREN ) 1 % GEL, Apply 2 g topically 4 (four) times daily as needed. (Patient not taking: Reported on 10/03/2023), Disp: 100 g, Rfl: 2   Fe Fum-FePoly-FA-Vit C-Vit B3 (FOLIVANE-F) 125-1 MG CAPS, Take 1 capsule three times a day with meals. (Patient not taking: Reported on 10/03/2023), Disp: 180 capsule, Rfl: 0   Fe Fum-FePoly-FA-Vit C-Vit B3 (FOLIVANE-F) 125-1 MG CAPS, Take 1 capsule Daily (Patient not taking: Reported on 10/03/2023), Disp: 90 capsule, Rfl: 1   glucose blood test strip, Use to check blood sugar once daily or as directed. (Patient not taking: Reported on 10/03/2023), Disp: 100 each, Rfl: 12   Lancets MISC, Use to check blood sugar once daily or as directed. (Patient not taking: Reported on 04/13/2023), Disp: 200 each, Rfl: 12   metFORMIN  (GLUCOPHAGE ) 500 MG tablet, Take 1 tablet (500 mg total) by mouth daily with breakfast. (Patient not taking: Reported on 08/10/2023), Disp: 30 tablet, Rfl: 11 Allergies  Allergen Reactions   Morphine And Codeine     Unknown      ROS: A complete ROS was performed with pertinent positives/negatives noted in the HPI. The remainder of the ROS are negative.    Objective:   Today's Vitals   12/07/23 1426  BP: 122/66  Pulse: 83  Temp: 97.8 F (36.6 C)  TempSrc: Temporal  SpO2: 96%  Weight: 141 lb 6.4 oz (64.1 kg)  Height: 5' 5 (1.651 m)    GENERAL: Well-appearing, in NAD. Well nourished.  SKIN: Pink, warm and dry. No rash, lesion, ulceration, or ecchymoses.  NECK: Trachea midline. Full ROM w/o pain or tenderness. No lymphadenopathy.  RESPIRATORY: Chest wall symmetrical. Respirations even and non-labored. Breath sounds clear to auscultation bilaterally.  CARDIAC:  S1, S2 present, regular rate and rhythm. Peripheral pulses 2+ bilaterally.  GI: Abdomen soft, non-tender. Normoactive bowel sounds. No CVA tenderness.  EXTREMITIES: Without clubbing, cyanosis, or edema.  NEUROLOGIC: No motor or sensory deficits. Steady, even gait.  PSYCH/MENTAL STATUS: Alert, oriented x 3. Cooperative, appropriate mood and affect.    Results for orders placed or performed in visit on 12/07/23  POCT Urinalysis Dipstick (Automated)  Result Value Ref Range   Color, UA     Clarity, UA     Glucose, UA Positive (A) Negative   Bilirubin, UA negative    Ketones, UA negative    Spec Grav, UA 1.015 1.010 - 1.025   Blood, UA     pH, UA 5.5 5.0 - 8.0   Protein, UA Negative Negative  Urobilinogen, UA 0.2 0.2 or 1.0 E.U./dL   Nitrite, UA neg    Leukocytes, UA Negative Negative      Assessment & Plan:  Assessment and Plan    Vulvovaginal candidiasis Suspected due to severe itching, burning during urination, and white discharge. Symptoms persistent for over a week. No recent antibiotic use. Urine sample negative for UTI. Likely exacerbated by diabetes-related hyperglycemia. - Prescribed Diflucan (fluconazole) 150 mg, take one tablet today. If symptoms persist after three days, take a second tablet. - Advised use of over-the-counter Vagisil for itching as needed. - Sent urine sample for culture to rule out UTI. - Educated on maintaining genitourinary hygiene  Burning with urination Burning sensation during urination associated with vulvovaginal candidiasis. No fever, hematuria, or bladder pain. - Address underlying vulvovaginal candidiasis as primary treatment for dysuria.    - Awaiting urine culture results   Meds ordered this encounter  Medications   fluconazole (DIFLUCAN) 150 MG tablet    Sig: Take 1 tablet by mouth once. Repeat dose in 3 days if symptoms persist.    Dispense:  2 tablet    Refill:  0    Supervising Provider:   SEBASTIAN BEVERLEY NOVAK [[id number]]   Orders  Placed This Encounter  Procedures   Urine Culture   POCT Urinalysis Dipstick (Automated)   Lab Orders         Urine Culture         POCT Urinalysis Dipstick (Automated)     No images are attached to the encounter or orders placed in the encounter.  Return for Scheduled Routine Office Visits and as needed.   Rosina Senters, FNP

## 2023-12-07 NOTE — Patient Instructions (Signed)
 Vaginal itching : Over the counter Vagisil ( can use as needed for itching)

## 2023-12-08 LAB — URINE CULTURE
MICRO NUMBER:: 17225748
Result:: NO GROWTH
SPECIMEN QUALITY:: ADEQUATE

## 2023-12-09 ENCOUNTER — Ambulatory Visit: Payer: Self-pay | Admitting: Internal Medicine

## 2023-12-10 ENCOUNTER — Emergency Department (HOSPITAL_COMMUNITY)

## 2023-12-10 ENCOUNTER — Inpatient Hospital Stay (HOSPITAL_COMMUNITY)
Admission: EM | Admit: 2023-12-10 | Discharge: 2023-12-13 | DRG: 307 | Disposition: A | Discharge status: Home / Self Care | Attending: Emergency Medicine | Admitting: Emergency Medicine

## 2023-12-10 ENCOUNTER — Other Ambulatory Visit: Payer: Self-pay

## 2023-12-10 DIAGNOSIS — I10 Essential (primary) hypertension: Secondary | ICD-10-CM | POA: Diagnosis present

## 2023-12-10 DIAGNOSIS — R42 Dizziness and giddiness: Principal | ICD-10-CM | POA: Diagnosis present

## 2023-12-10 DIAGNOSIS — H6122 Impacted cerumen, left ear: Secondary | ICD-10-CM | POA: Diagnosis present

## 2023-12-10 DIAGNOSIS — Z7984 Long term (current) use of oral hypoglycemic drugs: Secondary | ICD-10-CM

## 2023-12-10 DIAGNOSIS — I358 Other nonrheumatic aortic valve disorders: Principal | ICD-10-CM | POA: Diagnosis present

## 2023-12-10 DIAGNOSIS — R5381 Other malaise: Secondary | ICD-10-CM | POA: Diagnosis present

## 2023-12-10 DIAGNOSIS — Z823 Family history of stroke: Secondary | ICD-10-CM

## 2023-12-10 DIAGNOSIS — B379 Candidiasis, unspecified: Secondary | ICD-10-CM | POA: Diagnosis present

## 2023-12-10 DIAGNOSIS — R55 Syncope and collapse: Secondary | ICD-10-CM | POA: Diagnosis not present

## 2023-12-10 DIAGNOSIS — Z800 Family history of malignant neoplasm of digestive organs: Secondary | ICD-10-CM

## 2023-12-10 DIAGNOSIS — Z79899 Other long term (current) drug therapy: Secondary | ICD-10-CM

## 2023-12-10 DIAGNOSIS — E785 Hyperlipidemia, unspecified: Secondary | ICD-10-CM | POA: Diagnosis present

## 2023-12-10 DIAGNOSIS — K449 Diaphragmatic hernia without obstruction or gangrene: Secondary | ICD-10-CM | POA: Diagnosis present

## 2023-12-10 DIAGNOSIS — E876 Hypokalemia: Secondary | ICD-10-CM | POA: Diagnosis present

## 2023-12-10 DIAGNOSIS — E1165 Type 2 diabetes mellitus with hyperglycemia: Secondary | ICD-10-CM | POA: Diagnosis present

## 2023-12-10 DIAGNOSIS — Z803 Family history of malignant neoplasm of breast: Secondary | ICD-10-CM

## 2023-12-10 DIAGNOSIS — K219 Gastro-esophageal reflux disease without esophagitis: Secondary | ICD-10-CM | POA: Diagnosis present

## 2023-12-10 DIAGNOSIS — Z885 Allergy status to narcotic agent status: Secondary | ICD-10-CM

## 2023-12-10 DIAGNOSIS — K580 Irritable bowel syndrome with diarrhea: Secondary | ICD-10-CM | POA: Diagnosis present

## 2023-12-10 DIAGNOSIS — F419 Anxiety disorder, unspecified: Secondary | ICD-10-CM | POA: Diagnosis present

## 2023-12-10 DIAGNOSIS — Z83719 Family history of colon polyps, unspecified: Secondary | ICD-10-CM

## 2023-12-10 LAB — I-STAT CHEM 8, ED
BUN: 9 mg/dL (ref 8–23)
Calcium, Ion: 1.11 mmol/L — ABNORMAL LOW (ref 1.15–1.40)
Chloride: 101 mmol/L (ref 98–111)
Creatinine, Ser: 0.6 mg/dL (ref 0.44–1.00)
Glucose, Bld: 349 mg/dL — ABNORMAL HIGH (ref 70–99)
HCT: 35 % — ABNORMAL LOW (ref 36.0–46.0)
Hemoglobin: 11.9 g/dL — ABNORMAL LOW (ref 12.0–15.0)
Potassium: 3.4 mmol/L — ABNORMAL LOW (ref 3.5–5.1)
Sodium: 137 mmol/L (ref 135–145)
TCO2: 25 mmol/L (ref 22–32)

## 2023-12-10 LAB — COMPREHENSIVE METABOLIC PANEL WITH GFR
ALT: 15 U/L (ref 0–44)
AST: 17 U/L (ref 15–41)
Albumin: 3.8 g/dL (ref 3.5–5.0)
Alkaline Phosphatase: 107 U/L (ref 38–126)
Anion gap: 9 (ref 5–15)
BUN: 9 mg/dL (ref 8–23)
CO2: 25 mmol/L (ref 22–32)
Calcium: 8.6 mg/dL — ABNORMAL LOW (ref 8.9–10.3)
Chloride: 103 mmol/L (ref 98–111)
Creatinine, Ser: 0.6 mg/dL (ref 0.44–1.00)
GFR, Estimated: >60 mL/min (ref >60)
Glucose, Bld: 355 mg/dL — ABNORMAL HIGH (ref 70–99)
Potassium: 3.4 mmol/L — ABNORMAL LOW (ref 3.5–5.1)
Sodium: 137 mmol/L (ref 135–145)
Total Bilirubin: 0.3 mg/dL (ref 0.0–1.2)
Total Protein: 6.2 g/dL — ABNORMAL LOW (ref 6.5–8.1)

## 2023-12-10 LAB — URINALYSIS, ROUTINE W REFLEX MICROSCOPIC
Bacteria, UA: NONE SEEN
Bilirubin Urine: NEGATIVE
Glucose, UA: >=500 mg/dL — AB
Ketones, ur: 5 mg/dL — AB
Leukocytes,Ua: NEGATIVE
Nitrite: NEGATIVE
Protein, ur: 100 mg/dL — AB
Specific Gravity, Urine: 1.02 (ref 1.005–1.030)
pH: 5 (ref 5.0–8.0)

## 2023-12-10 LAB — CBG MONITORING, ED
Glucose-Capillary: 366 mg/dL — ABNORMAL HIGH (ref 70–99)
Glucose-Capillary: 321 mg/dL — ABNORMAL HIGH (ref 70–99)
Glucose-Capillary: 258 mg/dL — ABNORMAL HIGH (ref 70–99)

## 2023-12-10 LAB — TROPONIN T, HIGH SENSITIVITY
Troponin T High Sensitivity: <15 ng/L (ref 0–19)
Troponin T High Sensitivity: <15 ng/L (ref 0–19)

## 2023-12-10 LAB — GLUCOSE, CAPILLARY: Glucose-Capillary: 322 mg/dL — ABNORMAL HIGH (ref 70–99)

## 2023-12-10 LAB — TSH: TSH: 1.42 u[IU]/mL (ref 0.350–4.500)

## 2023-12-10 LAB — CBC
HCT: 37.1 % (ref 36.0–46.0)
Hemoglobin: 11.9 g/dL — ABNORMAL LOW (ref 12.0–15.0)
MCH: 26.2 pg (ref 26.0–34.0)
MCHC: 32.1 g/dL (ref 30.0–36.0)
MCV: 81.7 fL (ref 80.0–100.0)
Platelets: 275 K/uL (ref 150–400)
RBC: 4.54 MIL/uL (ref 3.87–5.11)
RDW: 13.2 % (ref 11.5–15.5)
WBC: 5.8 K/uL (ref 4.0–10.5)
nRBC: 0 % (ref 0.0–0.2)

## 2023-12-10 LAB — VITAMIN B12: Vitamin B-12: 450 pg/mL (ref 180–914)

## 2023-12-10 MED ORDER — INSULIN GLARGINE-YFGN 100 UNIT/ML ~~LOC~~ SOLN
5.0000 [IU] | Freq: Every day | SUBCUTANEOUS | Status: DC
  Administered 2023-12-11 – 2023-12-12 (×2): 5 [IU] via SUBCUTANEOUS
  Filled 2023-12-10 (×3): qty 0.05

## 2023-12-10 MED ORDER — FLUCONAZOLE 100 MG PO TABS: 100.0000 mg | ORAL_TABLET | Freq: Every day | ORAL | Status: DC

## 2023-12-10 MED ORDER — ONDANSETRON HCL 4 MG/2ML IJ SOLN: 4.0000 mg | Freq: Four times a day (QID) | INTRAMUSCULAR | Status: DC | PRN

## 2023-12-10 MED ORDER — MECLIZINE HCL 25 MG PO TABS
25.0000 mg | ORAL_TABLET | Freq: Once | ORAL | Status: AC
  Administered 2023-12-10: 25 mg via ORAL
  Filled 2023-12-10: qty 1

## 2023-12-10 MED ORDER — ACETAMINOPHEN 650 MG RE SUPP: 650.0000 mg | Freq: Four times a day (QID) | RECTAL | Status: DC | PRN

## 2023-12-10 MED ORDER — ROSUVASTATIN CALCIUM 5 MG PO TABS
5.0000 mg | ORAL_TABLET | Freq: Every day | ORAL | Status: DC
  Administered 2023-12-10 – 2023-12-12 (×3): 5 mg via ORAL
  Filled 2023-12-10 (×3): qty 1

## 2023-12-10 MED ORDER — LACTATED RINGERS IV BOLUS
1000.0000 mL | Freq: Once | INTRAVENOUS | Status: AC
  Administered 2023-12-10: 1000 mL via INTRAVENOUS

## 2023-12-10 MED ORDER — ENOXAPARIN SODIUM 40 MG/0.4ML IJ SOSY
40.0000 mg | PREFILLED_SYRINGE | INTRAMUSCULAR | Status: DC
  Administered 2023-12-11 – 2023-12-13 (×2): 40 mg via SUBCUTANEOUS
  Filled 2023-12-10 (×2): qty 0.4

## 2023-12-10 MED ORDER — ONDANSETRON HCL 4 MG PO TABS: 4.0000 mg | ORAL_TABLET | Freq: Four times a day (QID) | ORAL | Status: DC | PRN

## 2023-12-10 MED ORDER — LACTATED RINGERS IV SOLN
INTRAVENOUS | Status: AC
  Administered 2023-12-10: 10 mL/h via INTRAVENOUS

## 2023-12-10 MED ORDER — POTASSIUM CHLORIDE 20 MEQ PO PACK
40.0000 meq | PACK | Freq: Once | ORAL | Status: AC
  Administered 2023-12-10: 40 meq via ORAL
  Filled 2023-12-10: qty 2

## 2023-12-10 MED ORDER — INSULIN ASPART 100 UNIT/ML IJ SOLN
0.0000 [IU] | Freq: Three times a day (TID) | INTRAMUSCULAR | Status: DC
  Administered 2023-12-11 (×2): 3 [IU] via SUBCUTANEOUS
  Filled 2023-12-10 (×2): qty 3

## 2023-12-10 MED ORDER — VENLAFAXINE HCL ER 150 MG PO CP24
150.0000 mg | ORAL_CAPSULE | Freq: Every day | ORAL | Status: DC
  Administered 2023-12-10 – 2023-12-12 (×3): 150 mg via ORAL
  Filled 2023-12-10 (×3): qty 1

## 2023-12-10 MED ORDER — INSULIN GLARGINE-YFGN 100 UNIT/ML ~~LOC~~ SOLN
5.0000 [IU] | Freq: Every day | SUBCUTANEOUS | Status: DC
  Administered 2023-12-10: 5 [IU] via SUBCUTANEOUS
  Filled 2023-12-10: qty 0.05

## 2023-12-10 MED ORDER — MECLIZINE HCL 25 MG PO TABS
25.0000 mg | ORAL_TABLET | Freq: Three times a day (TID) | ORAL | Status: DC | PRN
Start: 2023-12-10 — End: 2023-12-13

## 2023-12-10 MED ORDER — ACETAMINOPHEN 325 MG PO TABS
650.0000 mg | ORAL_TABLET | Freq: Four times a day (QID) | ORAL | Status: DC | PRN
  Administered 2023-12-11 – 2023-12-13 (×2): 650 mg via ORAL
  Filled 2023-12-10 (×2): qty 2

## 2023-12-10 MED ORDER — PANTOPRAZOLE SODIUM 40 MG PO TBEC
40.0000 mg | DELAYED_RELEASE_TABLET | Freq: Every day | ORAL | Status: DC
  Administered 2023-12-11 – 2023-12-13 (×2): 40 mg via ORAL
  Filled 2023-12-10 (×2): qty 1

## 2023-12-10 MED ORDER — ONDANSETRON HCL 4 MG/2ML IJ SOLN
4.0000 mg | Freq: Once | INTRAMUSCULAR | Status: AC
  Administered 2023-12-10: 4 mg via INTRAVENOUS
  Filled 2023-12-10: qty 2

## 2023-12-10 MED ORDER — LORAZEPAM 1 MG PO TABS
1.0000 mg | ORAL_TABLET | Freq: Two times a day (BID) | ORAL | Status: DC
  Administered 2023-12-10: 1 mg via ORAL
  Filled 2023-12-10: qty 1

## 2023-12-10 MED ORDER — CALCIUM GLUCONATE-NACL 1-0.675 GM/50ML-% IV SOLN
1.0000 g | Freq: Once | INTRAVENOUS | Status: AC
  Administered 2023-12-10: 1000 mg via INTRAVENOUS
  Filled 2023-12-10: qty 50

## 2023-12-10 NOTE — ED Triage Notes (Signed)
 Patient to ED by EMS from home with high blood sugar readings states she has a HX of HTN and diabetes but do no take insulin  at home. EMS started 20g in L AC and gave 500NS and 4mg  of Zofran .

## 2023-12-10 NOTE — H&P (Addendum)
 [facility] H&P    Patient Demographics:    Stephanie Roach, is a 88 y.o. female  MRN: [id number]  DOB - 1935-11-08  Admit Date - 12/10/2023  Referring MD/NP/PA: Dr. Freddi  Outpatient Primary MD for the patient is Billy Knee, FNP  Patient coming from: Home  Chief complaint-dizziness   HPI:    Stephanie Roach  is a 88 y.o. female, with medical history of diabetes mellitus type 2, supposed to take metformin  however not taking it, anxiety, hypertension, GERD, hyperlipidemia, recent diagnosis of vulvovaginal candidiasis treated with Diflucan was brought to hospital for evaluation of dizziness.  Patient says that she walked into the kitchen around 2:30 PM at that time she suddenly felt dizzy and off balance.  She immediately went to sit down and felt that she would pass out.  At the same time patient's daughter arrived and held her so she did not fall.  As per daughter patient was unresponsive for few seconds.  EMS was called and patient was vomiting, urination difficulties on herself.  There was no loss of consciousness or seizure-like activity, no speech deficits. She was recently started on fluconazole for yeast infection. Patient does have diabetes mellitus however has not been taking metformin  due to concern for kidney damage. She denies chest pain or shortness of breath Denies abdominal pain Denies fever or chills, no dysuria She did complain of dizziness in the ED CT head obtained in the ED was unremarkable No previous history of stroke or seizures She does have wax in the left ear and is supposed to see ENT as outpatient    Review of systems:    In addition to the HPI above,    All other systems reviewed and are negative.    Past History of the following :    Past Medical History:  Diagnosis Date   Allergic rhinitis    Anemia    Anxiety    Arthritis    Depression    GERD (gastroesophageal  reflux disease)    Hyperlipidemia    Hypertension    Migraine    Prediabetes    Sleep apnea    no cpap   Vitamin D  deficiency       Past Surgical History:  Procedure Laterality Date   BREAST BIOPSY Right 10/19/2018   FIBROCYSTIC CHANGE WITH USUAL DUCTAL HYPERPLASIA,   BREAST EXCISIONAL BIOPSY Right 07/05/2019   Negative for in situ or invasive carcinoma   BREAST LUMPECTOMY WITH RADIOACTIVE SEED LOCALIZATION Right 07/05/2019   Procedure: RIGHT BREAST LUMPECTOMY WITH RADIOACTIVE SEED LOCALIZATION;  Surgeon: Vernetta Berg, MD;  Location: [facility] OR;  Service: General;  Laterality: Right;   BUNIONECTOMY     CATARACT EXTRACTION Bilateral    CHOLECYSTECTOMY  2003   KNEE ARTHROSCOPY     WRIST FRACTURE SURGERY        Social History:      Social History   Tobacco Use   Smoking status: Never   Smokeless tobacco: Never  Substance Use Topics   Alcohol use: No  Family History :     Family History  Problem Relation Age of Onset   Colon cancer Father    Stroke Mother    Breast cancer Cousin    Pancreatic cancer Paternal Aunt    Ulcerative colitis Brother    Colon polyps Maternal Aunt       Home Medications:   Prior to Admission medications   Medication Sig Start Date End Date Taking? Authorizing Provider  Cholecalciferol (VITAMIN D3) 125 MCG (5000 UT) CAPS Take by mouth.   Yes [provider]  LORazepam (ATIVAN) 1 MG tablet Take 1 mg by mouth 2 (two) times daily.   Yes [provider]  omeprazole  (PRILOSEC) 20 MG capsule Take by mouth. 12/19/17  Yes [provider]  rosuvastatin  (CRESTOR ) 5 MG tablet Take 1 tablet (5 mg total) by mouth daily. 04/13/23 04/12/24 Yes Billy Knee, FNP  traZODone  (DESYREL ) 50 MG tablet Take  1 to 2 tablets  1 hour  before Bedtime as needed for Sleep 03/20/20  Yes Tonita Fallow, MD  venlafaxine  XR (EFFEXOR -XR) 150 MG 24 hr capsule Take 150 mg by mouth at bedtime. 12/19/17  Yes [provider]  Blood  Glucose Monitoring Suppl (ACCU-CHEK GUIDE) w/Device KIT USE TO CHECK BLOOD SUGAR ONCE DAILY OR AS DIRECTED. Patient not taking: Reported on 04/13/2023 06/01/22   Wilkinson, Dana E, NP     Allergies:     Allergies  Allergen Reactions   Morphine And Codeine     Unknown      Physical Exam:   Vitals  Blood pressure (!) 140/78, pulse 81, temperature 97.7 F (36.5 C), temperature source Oral, resp. rate 18, height 5' 5 (1.651 m), weight 62.6 kg, SpO2 92%.  1.  General: Appears in no acute distress  2. Psychiatric: Alert, oriented x 3, intact insight and judgment  3. Neurologic: Cranial nerves II through XII grossly intact, no focal deficit noted  4. HEENMT:  Atraumatic normocephalic, extraocular's are intact  5. Respiratory : Crackles auscultated in right lung base  6. Cardiovascular : S1-S2, regular, no murmur auscultated  7. Gastrointestinal:  Abdomen soft, nontender, no organomegaly  8. Skin:  No rashes noted      Data Review:    CBC Recent Labs  Lab 12/10/23 1611 12/10/23 1620  WBC 5.8  --   HGB 11.9* 11.9*  HCT 37.1 35.0*  PLT 275  --   MCV 81.7  --   MCH 26.2  --   MCHC 32.1  --   RDW 13.2  --    ------------------------------------------------------------------------------------------------------------------  Results for orders placed or performed during the hospital encounter of 12/10/23 (from the past 48 hours)  CBC     Status: Abnormal   Collection Time: 12/10/23  4:11 PM  Result Value Ref Range   WBC 5.8 4.0 - 10.5 K/uL   RBC 4.54 3.87 - 5.11 MIL/uL   Hemoglobin 11.9 (L) 12.0 - 15.0 g/dL   HCT 62.8 63.9 - 53.9 %   MCV 81.7 80.0 - 100.0 fL   MCH 26.2 26.0 - 34.0 pg   MCHC 32.1 30.0 - 36.0 g/dL   RDW 86.7 88.4 - 84.4 %   Platelets 275 150 - 400 K/uL   nRBC 0.0 0.0 - 0.2 %    Comment: Performed at [facility], [address], [place], [place] [address]  Comprehensive metabolic panel     Status: Abnormal   Collection  Time: 12/10/23  4:11 PM  Result Value Ref Range  Sodium 137 135 - 145 mmol/L   Potassium 3.4 (L) 3.5 - 5.1 mmol/L   Chloride 103 98 - 111 mmol/L   CO2 25 22 - 32 mmol/L   Glucose, Bld 355 (H) 70 - 99 mg/dL    Comment: Glucose reference range applies only to samples taken after fasting for at least 8 hours.   BUN 9 8 - 23 mg/dL   Creatinine, Ser 9.39 0.44 - 1.00 mg/dL   Calcium  8.6 (L) 8.9 - 10.3 mg/dL   Total Protein 6.2 (L) 6.5 - 8.1 g/dL   Albumin 3.8 3.5 - 5.0 g/dL   AST 17 15 - 41 U/L   ALT 15 0 - 44 U/L   Alkaline Phosphatase 107 38 - 126 U/L   Total Bilirubin 0.3 0.0 - 1.2 mg/dL   GFR, Estimated >39 >39 mL/min    Comment: (NOTE) Calculated using the CKD-EPI Creatinine Equation (2021)    Anion gap 9 5 - 15    Comment: Performed at [facility], [address], [place], [place] [address]  Troponin T, High Sensitivity     Status: None   Collection Time: 12/10/23  4:11 PM  Result Value Ref Range   Troponin T High Sensitivity <15 0 - 19 ng/L    Comment: (NOTE) Biotin concentrations > 1000 ng/mL falsely decrease TnT results.  Serial cardiac troponin measurements are suggested.  Refer to the Links section for chest pain algorithms and additional  guidance. Performed at [facility], [address], [place], [place] [address]   CBG monitoring, ED     Status: Abnormal   Collection Time: 12/10/23  4:12 PM  Result Value Ref Range   Glucose-Capillary 366 (H) 70 - 99 mg/dL    Comment: Glucose reference range applies only to samples taken after fasting for at least 8 hours.  Urinalysis, Routine w reflex microscopic -Urine, Clean Catch     Status: Abnormal   Collection Time: 12/10/23  4:15 PM  Result Value Ref Range   Color, Urine STRAW (A) YELLOW   APPearance CLEAR CLEAR   Specific Gravity, Urine 1.020 1.005 - 1.030   pH 5.0 5.0 - 8.0   Glucose, UA >=500 (A) NEGATIVE mg/dL   Hgb urine dipstick SMALL (A) NEGATIVE   Bilirubin Urine  NEGATIVE NEGATIVE   Ketones, ur 5 (A) NEGATIVE mg/dL   Protein, ur 899 (A) NEGATIVE mg/dL   Nitrite NEGATIVE NEGATIVE   Leukocytes,Ua NEGATIVE NEGATIVE   RBC / HPF 0-5 0 - 5 RBC/hpf   WBC, UA 6-10 0 - 5 WBC/hpf   Bacteria, UA NONE SEEN NONE SEEN   Squamous Epithelial / HPF 0-5 0 - 5 /HPF    Comment: Performed at [facility], [address] [place], [place] [address]  I-stat chem 8, ed     Status: Abnormal   Collection Time: 12/10/23  4:20 PM  Result Value Ref Range   Sodium 137 135 - 145 mmol/L   Potassium 3.4 (L) 3.5 - 5.1 mmol/L   Chloride 101 98 - 111 mmol/L   BUN 9 8 - 23 mg/dL   Creatinine, Ser 9.39 0.44 - 1.00 mg/dL   Glucose, Bld 650 (H) 70 - 99 mg/dL    Comment: Glucose reference range applies only to samples taken after fasting for at least 8 hours.   Calcium , Ion 1.11 (L) 1.15 - 1.40 mmol/L   TCO2 25 22 - 32 mmol/L   Hemoglobin 11.9 (L) 12.0 - 15.0 g/dL  HCT 35.0 (L) 36.0 - 46.0 %  Troponin T, High Sensitivity     Status: None   Collection Time: 12/10/23  6:44 PM  Result Value Ref Range   Troponin T High Sensitivity <15 0 - 19 ng/L    Comment: (NOTE) Biotin concentrations > 1000 ng/mL falsely decrease TnT results.  Serial cardiac troponin measurements are suggested.  Refer to the Links section for chest pain algorithms and additional  guidance. Performed at [facility], [address] [place], [place] [address]   CBG monitoring, ED     Status: Abnormal   Collection Time: 12/10/23  6:54 PM  Result Value Ref Range   Glucose-Capillary 321 (H) 70 - 99 mg/dL    Comment: Glucose reference range applies only to samples taken after fasting for at least 8 hours.  CBG monitoring, ED     Status: Abnormal   Collection Time: 12/10/23  8:27 PM  Result Value Ref Range   Glucose-Capillary 258 (H) 70 - 99 mg/dL    Comment: Glucose reference range applies only to samples taken after fasting for at least 8 hours.    Chemistries   Recent Labs  Lab 12/10/23 1611 12/10/23 1620  NA 137 137  K 3.4* 3.4*  CL 103 101  CO2 25  --   GLUCOSE 355* 349*  BUN 9 9  CREATININE 0.60 0.60  CALCIUM  8.6*  --   AST 17  --   ALT 15  --   ALKPHOS 107  --   BILITOT 0.3  --    ------------------------------------------------------------------------------------------------------------------  ------------------------------------------------------------------------------------------------------------------ GFR: Estimated Creatinine Clearance: 46.3 mL/min (by C-G formula based on SCr of 0.6 mg/dL). Liver Function Tests: Recent Labs  Lab 12/10/23 1611  AST 17  ALT 15  ALKPHOS 107  BILITOT 0.3  PROT 6.2*  ALBUMIN 3.8   No results for input(s): LIPASE, AMYLASE in the last 168 hours. No results for input(s): AMMONIA in the last 168 hours. Coagulation Profile: No results for input(s): INR, PROTIME in the last 168 hours. Cardiac Enzymes: No results for input(s): CKTOTAL, CKMB, CKMBINDEX, TROPONINI in the last 168 hours. BNP (last 3 results) No results for input(s): PROBNP in the last 8760 hours. HbA1C: No results for input(s): HGBA1C in the last 72 hours. CBG: Recent Labs  Lab 12/10/23 1612 12/10/23 1854 12/10/23 2027  GLUCAP 366* 321* 258*   Lipid Profile: No results for input(s): CHOL, HDL, LDLCALC, TRIG, CHOLHDL, LDLDIRECT in the last 72 hours. Thyroid  Function Tests: No results for input(s): TSH, T4TOTAL, FREET4, T3FREE, THYROIDAB in the last 72 hours. Anemia Panel: No results for input(s): VITAMINB12, FOLATE, FERRITIN, TIBC, IRON, RETICCTPCT in the last 72 hours.  --------------------------------------------------------------------------------------------------------------- Urine analysis:    Component Value Date/Time   COLORURINE STRAW (A) 12/10/2023 1615   APPEARANCEUR CLEAR 12/10/2023 1615   LABSPEC 1.020 12/10/2023 1615   PHURINE 5.0 12/10/2023  1615   GLUCOSEU >=500 (A) 12/10/2023 1615   HGBUR SMALL (A) 12/10/2023 1615   BILIRUBINUR NEGATIVE 12/10/2023 1615   BILIRUBINUR negative 12/07/2023 1527   KETONESUR 5 (A) 12/10/2023 1615   PROTEINUR 100 (A) 12/10/2023 1615   UROBILINOGEN 0.2 12/07/2023 1527   UROBILINOGEN 0.2 08/27/2013 1130   NITRITE NEGATIVE 12/10/2023 1615   LEUKOCYTESUR NEGATIVE 12/10/2023 1615      Imaging Results:    CT Head Wo Contrast Result Date: 12/10/2023 CLINICAL DATA:  Neurologic deficit EXAM: CT HEAD WITHOUT CONTRAST TECHNIQUE: Contiguous axial images were obtained from the base of the skull through the  vertex without intravenous contrast. RADIATION DOSE REDUCTION: This exam was performed according to the departmental dose-optimization program which includes automated exposure control, adjustment of the mA and/or kV according to patient size and/or use of iterative reconstruction technique. COMPARISON:  None Available. FINDINGS: Brain: No acute infarct or hemorrhage. Lateral ventricles and midline structures are unremarkable. No acute extra-axial fluid collections. No mass effect. Vascular: No hyperdense vessel or unexpected calcification. Skull: Normal. Negative for fracture or focal lesion. Sinuses/Orbits: No acute finding. Other: None. IMPRESSION: 1. No acute intracranial process. Electronically Signed   By: Ozell Daring M.D.   On: 12/10/2023 19:13    My personal review of EKG: Rhythm NSR, nonspecific T wave abnormalities   Assessment & Plan:    Principal Problem:   Near syncope   Dizziness/near syncope-unclear etiology, CT head was unremarkable.  Patient continues to have intermittent dizziness, will obtain MRI brain in AM.  Will check orthostatic vital signs every 4 hours x 3.  Continue monitoring on telemetry.  Check echocardiogram in AM.  She does have history of left ear problems.  Will obtain vestibular PT in a.m. check B12 level, TSH.  Continue meclizine 25 mg 3 times daily as needed.  Troponin  x 2 are negative.  There was no seizure-like activity, no postictal confusion.  She did have loss of bladder and bowel control however patient does have chronic history of incontinence as per patient's daughter.  Diabetes mellitus type 2-blood glucose has been elevated.  Patient does not take metformin  at home.  Last A1c was 8.7.  Will start sliding scale insulin  NovoLog and Lantus 5 units SQ nightly.  Hypokalemia-potassium is 3.4.  Will replace potassium and follow BMP in am.  GERD-continue Protonix 40 mg daily.  Vulvovaginal candidiasis-treated with 150 mg of Diflucan on 12/07/2023.  Patient continues to have symptoms.  Will start Diflucan 100 mg p.o. daily for 3 more days.  Hyperlipidemia-continue Crestor .  Anxiety-continue Ativan 1 mg p.o. twice daily.  Continue venlafaxine   Crackles in right lung base-Will obtain chest x-ray  Hypocalcemia-mild, ionized calcium  1.11.  Will give 1 g of calcium  gluconate,   DVT Prophylaxis-   Lovenox   AM Labs Ordered, also please review Full Orders  Family Communication: Admission, patients condition and plan of care including tests being ordered have been discussed with the patient and and her daughter at bedside who indicate understanding and agree with the plan and Code Status.  Code Status: Full code  Admission status: Observation/       Time spent in minutes : 60 minutes   Flecia Shutter S Caira Poche M.D

## 2023-12-10 NOTE — ED Notes (Signed)
 Patient transported to CT

## 2023-12-10 NOTE — Discharge Instructions (Addendum)
 Thank you for letting us  evaluate you today.  Your lab work was notable for elevated sugar level in the 300s.  It is imperative that you take your metformin  as prescribed and also recommended by your PCP for diabetes.  If you do not have adequate sugar control, this will damage your kidneys.  Your CT imaging of your head did not show any intracranial abnormalities nor bleeding.  Your cardiac enzymes were normal.  Your EKG is normal.  Your vital signs are stable.  Please follow-up with PCP as scheduled for further management  Return to Emergency Department if you experience altered mentation, seizure-like activity, loss of consciousness, chest pain, shortness of breath, worsening symptoms

## 2023-12-10 NOTE — ED Provider Notes (Signed)
 [place] EMERGENCY DEPARTMENT AT [facility] Provider Note   CSN: [id number] Arrival date & time: 12/10/23  1524     Patient presents with: Hyperglycemia   Stephanie Roach is a 88 y.o. female with past medical history of T2DM (on metformin ), HTN, IDA presents to Emergency Department for evaluation of dizziness, hyperglycemia.  Reports that at 1430 today, she walked into the kitchen when she suddenly felt dizzy and off balance.  She immediately went to sit down as she felt that she would pass out.  Sitting down resolved her dizziness.  Grandson returned from grocery store at this time and noted that she was holding onto counter and appeared inebriated.  She states that she recalls entire incident with no LOC. EMS was called then she vomiting, urinated, and defecated on herself. This was all witnessed by grandson who was able to speak to her with no confusion, LOC, seizure like activity, nor speech deficits. Patient reports that she is adequately hydrated and last ate a biscuit with ham this morning. She has no complaints prior to incident and felt at baseline this morning. Recently started fluconazole for yeast infection but no other new medications. Negative urine culture obtained on 12/07/23. Does not take metformin  and has not for several months due to concern of kidney damage. Grandson notes that patient has been complaining of fatigue over past couple weeks.  Denies cough, congestion, fevers, CP, SHOB, hx of vertigo, recent falls.  At arrival to ED, patient has no complaints of dizziness, lightheadedness, HA, abd pain, nor NV.  EMS endorsed CBG in 400s. Provided 500NS, 4mg  Zofran  PTA.    Hyperglycemia Associated symptoms: nausea and vomiting        Prior to Admission medications   Medication Sig Start Date End Date Taking? Authorizing Provider  Blood Glucose Monitoring Suppl (ACCU-CHEK GUIDE ME) w/Device KIT USE TO CHECK BLOOD SUGAR ONCE DAILY OR AS DIRECTED. Patient  not taking: Reported on 10/03/2023 07/16/22   Wilkinson, Dana E, NP  Blood Glucose Monitoring Suppl (ACCU-CHEK GUIDE) w/Device KIT USE TO CHECK BLOOD SUGAR ONCE DAILY OR AS DIRECTED. Patient not taking: Reported on 10/03/2023 06/01/22   Wilkinson, Dana E, NP  Cholecalciferol (VITAMIN D3) 125 MCG (5000 UT) CAPS Take by mouth.    [provider]  Cyanocobalamin  (VITAMIN B-12 PO) Take by mouth. Sometimes Patient not taking: Reported on 10/03/2023    [provider]  diclofenac  Sodium (VOLTAREN ) 1 % GEL Apply 2 g topically 4 (four) times daily as needed. Patient not taking: Reported on 10/03/2023 04/13/23   Billy Knee, FNP  Fe Fum-FePoly-FA-Vit C-Vit B3 (FOLIVANE-F) 125-1 MG CAPS Take 1 capsule three times a day with meals. Patient not taking: Reported on 10/03/2023 03/29/23   Webb, Padonda B, FNP  Fe Fum-FePoly-FA-Vit C-Vit B3 [facility]) 125-1 MG CAPS Take 1 capsule Daily Patient not taking: Reported on 10/03/2023 03/31/23   Webb, Padonda B, FNP  fluconazole (DIFLUCAN) 150 MG tablet Take 1 tablet by mouth once. Repeat dose in 3 days if symptoms persist. 12/07/23   Billy Knee, FNP  glucose blood test strip Use to check blood sugar once daily or as directed. Patient not taking: Reported on 10/03/2023 06/29/22   Wilkinson, Dana E, NP  Lancets MISC Use to check blood sugar once daily or as directed. Patient not taking: Reported on 04/13/2023 06/29/22   Wilkinson, Dana E, NP  LORazepam (ATIVAN) 1 MG tablet Take 1 mg by mouth 2 (two) times daily.    [provider]  metFORMIN  (GLUCOPHAGE ) 500 MG tablet Take 1 tablet (500 mg total) by mouth daily with breakfast. Patient not taking: Reported on 08/10/2023 05/26/22 05/26/23  Wilkinson, Dana E, NP  omeprazole  (PRILOSEC) 20 MG capsule Take by mouth. 12/19/17   [provider]  rosuvastatin  (CRESTOR ) 5 MG tablet Take 1 tablet (5 mg total) by mouth daily. 04/13/23 04/12/24  Billy Knee, FNP  traZODone  (DESYREL ) 50 MG tablet Take  1 to 2 tablets  1  hour  before Bedtime as needed for Sleep 03/20/20   Tonita Fallow, MD  venlafaxine  XR (EFFEXOR -XR) 150 MG 24 hr capsule Take 150 mg by mouth at bedtime. 12/19/17   [provider]    Allergies: Morphine and codeine    Review of Systems  Gastrointestinal:  Positive for nausea and vomiting.    Updated Vital Signs BP 137/79 (BP Location: Right Arm)   Pulse 87   Temp 97.7 F (36.5 C) (Oral)   Resp 20   Ht 5' 5 (1.651 m)   Wt 62.6 kg   SpO2 94%   BMI 22.96 kg/m   Physical Exam Vitals and nursing note reviewed.  Constitutional:      General: She is not in acute distress.    Appearance: Normal appearance.  HENT:     Head: Normocephalic and atraumatic.  Eyes:     General: Lids are normal. Vision grossly intact. No visual field deficit.    Extraocular Movements:     Right eye: Normal extraocular motion and no nystagmus.     Left eye: Normal extraocular motion and no nystagmus.     Conjunctiva/sclera: Conjunctivae normal.  Cardiovascular:     Rate and Rhythm: Normal rate.  Pulmonary:     Effort: Pulmonary effort is normal. No respiratory distress.  Abdominal:     General: Bowel sounds are normal. There is no distension.     Palpations: Abdomen is soft.     Tenderness: There is no abdominal tenderness. There is no guarding or rebound.  Musculoskeletal:     Cervical back: Normal range of motion and neck supple. No rigidity.  Skin:    Coloration: Skin is not jaundiced or pale.  Neurological:     Mental Status: She is alert and oriented to person, place, and time. Mental status is at baseline.     GCS: GCS eye subscore is 4. GCS verbal subscore is 5. GCS motor subscore is 6.     Cranial Nerves: No cranial nerve deficit, dysarthria or facial asymmetry.     Sensory: Sensation is intact. No sensory deficit.     Motor: No weakness, tremor, atrophy, abnormal muscle tone, seizure activity or pronator drift.     Coordination: Coordination normal. Finger-Nose-Finger Test  and Heel to [place] Test normal.     Gait: Gait is intact.     Comments: A&Ox3. Denies dizziness. No vertical nor horizontal nystagmus. Denies blurred vision. No speech deficits. No agnosia. Motor 5/5 and sensation 2/2 of BUE and BLE     (all labs ordered are listed, but only abnormal results are displayed) Labs Reviewed  CBC - Abnormal; Notable for the following components:      Result Value   Hemoglobin 11.9 (*)    All other components within normal limits  URINALYSIS, ROUTINE W REFLEX MICROSCOPIC - Abnormal; Notable for the following components:   Color, Urine STRAW (*)    Glucose, UA >=500 (*)    Hgb urine dipstick SMALL (*)    Ketones, ur 5 (*)  Protein, ur 100 (*)    All other components within normal limits  COMPREHENSIVE METABOLIC PANEL WITH GFR - Abnormal; Notable for the following components:   Potassium 3.4 (*)    Glucose, Bld 355 (*)    Calcium  8.6 (*)    Total Protein 6.2 (*)    All other components within normal limits  CBG MONITORING, ED - Abnormal; Notable for the following components:   Glucose-Capillary 366 (*)    All other components within normal limits  I-STAT CHEM 8, ED - Abnormal; Notable for the following components:   Potassium 3.4 (*)    Glucose, Bld 349 (*)    Calcium , Ion 1.11 (*)    Hemoglobin 11.9 (*)    HCT 35.0 (*)    All other components within normal limits  CBG MONITORING, ED - Abnormal; Notable for the following components:   Glucose-Capillary 321 (*)    All other components within normal limits  CBG MONITORING, ED - Abnormal; Notable for the following components:   Glucose-Capillary 258 (*)    All other components within normal limits  TROPONIN T, HIGH SENSITIVITY  TROPONIN T, HIGH SENSITIVITY    EKG: EKG Interpretation Date/Time:  Saturday December 10 2023 17:55:03 EST Ventricular Rate:  83 PR Interval:  163 QRS Duration:  81 QT Interval:  377 QTC Calculation: 443 R Axis:   58  Text Interpretation: Sinus rhythm Borderline T  abnormalities, diffuse leads Confirmed by Freddi Hamilton [phone]) on 12/10/2023 6:06:41 PM  Radiology: CT Head Wo Contrast Result Date: 12/10/2023 CLINICAL DATA:  Neurologic deficit EXAM: CT HEAD WITHOUT CONTRAST TECHNIQUE: Contiguous axial images were obtained from the base of the skull through the vertex without intravenous contrast. RADIATION DOSE REDUCTION: This exam was performed according to the departmental dose-optimization program which includes automated exposure control, adjustment of the mA and/or kV according to patient size and/or use of iterative reconstruction technique. COMPARISON:  None Available. FINDINGS: Brain: No acute infarct or hemorrhage. Lateral ventricles and midline structures are unremarkable. No acute extra-axial fluid collections. No mass effect. Vascular: No hyperdense vessel or unexpected calcification. Skull: Normal. Negative for fracture or focal lesion. Sinuses/Orbits: No acute finding. Other: None. IMPRESSION: 1. No acute intracranial process. Electronically Signed   By: Ozell Daring M.D.   On: 12/10/2023 19:13     Medications Ordered in the ED  lactated ringers  bolus 1,000 mL (1,000 mLs Intravenous Bolus from Bag 12/10/23 2044)  meclizine (ANTIVERT) tablet 25 mg (25 mg Oral Given 12/10/23 2042)  ondansetron  (ZOFRAN ) injection 4 mg (4 mg Intravenous Given 12/10/23 2042)    Clinical Course as of 12/10/23 2101  Sat Dec 10, 2023  1733 Glucose(!): 355 No anion gap. Does not appear to be in DKA [LB]    Clinical Course User Index [LB] Minnie Tinnie BRAVO, PA                                 Medical Decision Making Amount and/or Complexity of Data Reviewed Labs: ordered. Decision-making details documented in ED Course. Radiology: ordered.  Risk Prescription drug management. Decision regarding hospitalization.   Patient presents to the ED for concern of hyperglycemia, dizziness, this involves an extensive number of treatment options, and is a complaint  that carries with it a high risk of complications and morbidity.  The differential diagnosis includes migraine, CVA/TIA, vertigo, electrolyte abnormality, UTI, infection, dehydration, ICH, ACS   Co morbidities that complicate the patient evaluation  Medication noncompliance Uncontrolled diabetes   Additional history obtained:  Additional history obtained from Nursing and Outside Medical Records   External records from outside source obtained and reviewed including triage RN note, PCP note from 12/07/2023   Lab Tests:  I Ordered, and personally interpreted labs.  The pertinent results include:   Troponin negative x 2 CBG 321 Potassium 3.4 Hgb 11.9 UA without infection   Imaging Studies ordered:  I ordered imaging studies including CT head I independently visualized and interpreted imaging which showed no acute intracranial abnormalities I agree with the radiologist interpretation   Cardiac Monitoring:  The patient was maintained on a cardiac monitor.  I personally viewed and interpreted the cardiac monitored which showed an underlying rhythm of: NSR 83 bpm with no STE nor T wave abnormalities  Consults obtained:  Discussed ED workup, imaging, dispo with hospitalist. Dr accepts patient for admission   Problem List / ED Course:  Dizziness Sudden onset and occurred following walking into kitchen.  No dizziness at rest.  Dizziness improved following resting, putting her head down in kitchen. Neurologically intact.  Sounded more positional in nature and patient reported full resolution since arriving to ED, receiving IVF from EMS. No complaints of headaches.  No history of migraines.  Low suspicion for migraine as etiology of dizziness Cardiac workup negative.  No complaints of chest pain, shortness of breath.  No tachycardia nor hypoxia.  No history of PE/DVT, exogenous hormones, unilateral leg swelling, hemoptysis, recent travel, recent surgery, recent immobilization. Only  RF is age. Low suspicion for ACS or PE as etiology No significant electrolyte abnormalities requiring intervention.  Does have hyperglycemia of 321.  No signs of DKA nor HHS.  No elevated anion gap nor AMS UA does not appear infectious.  Just had urine culture obtained a couple days ago which was negative for UTI.  She recently started fluconazole for yeast infection which is likely causing the 6-10 WBC in UA. No other new medications CT head without ICH No abdominal tenderness on exam. No leukocytosis. Low suspicion for intraabdominal pathology. Vomiting likely 2/2 dizziness. Has IBS-D at baseline and has multiple episodes of diarrhea a day at her baseline. She reports she was unable to make it to restroom when she suddenly had to have a BM, urinate so did so on EMS stretcher. She was aware of need to defecate, urinate. No lower back pain, saddle paresthesia, numbness nor weakness of leg. No hx of IVDU nor malignancy. Has been able to hold her bowels and bladder since being in ED. Low suspicion of cauda equina.  Episode was witnessed by grandson who denies LOC, seizure like activity. Patient was speaking and alert throughout incident. No confusion nor postictal period. Patient recalls entire incident. No hx of seizures. Low suspicion of seizures. At initial assessment, patient has no complaints whatsoever and is asking to be discharged as she feels significantly better.  Remains neurologically intact throughout ED visit Ambulated wo difficulty nor complaints of dizziness, CP, nor SHOB. Maintained O2 sats above 95% wo supplementation. No physical signs of resp distress Patient was standing up from bed to sit in wheelchair when she complained of dizziness. Will provide additional IVF, meclizine for hydration, dizziness, NV Think patient would benefit from admission, MRI tomorrow for dizziness, continued symptoms   Reevaluation:  After the interventions noted above, I reevaluated the patient and found  that they have :resolved    Dispostion:  After consideration of the diagnostic results and the patients response to treatment,  I feel that the patent would benefit from admission for dizziness  Consulted hospitalist Dr. Drusilla and discussed ED workup, dispo. They accept patient for admission  Discussed ED workup, dispo with patient and family at bedside who express understanding and agree with plan  Final diagnoses:  Dizziness    ED Discharge Orders     None        Minnie Tinnie BRAVO, PA 12/10/23 2103    Freddi Hamilton, MD 12/11/23 2250

## 2023-12-10 NOTE — ED Notes (Signed)
Patient and family aware that urine sample is needed.

## 2023-12-10 NOTE — ED Notes (Signed)
 Pt walked, no change in O2 saturation, remained 95% throughout.

## 2023-12-10 NOTE — ED Notes (Signed)
 RN called lab and had CMP and Troponin added

## 2023-12-11 ENCOUNTER — Encounter (HOSPITAL_COMMUNITY): Payer: Self-pay | Admitting: Family Medicine

## 2023-12-11 ENCOUNTER — Observation Stay (HOSPITAL_COMMUNITY)

## 2023-12-11 DIAGNOSIS — R42 Dizziness and giddiness: Secondary | ICD-10-CM | POA: Diagnosis present

## 2023-12-11 DIAGNOSIS — I358 Other nonrheumatic aortic valve disorders: Secondary | ICD-10-CM | POA: Diagnosis present

## 2023-12-11 DIAGNOSIS — Z800 Family history of malignant neoplasm of digestive organs: Secondary | ICD-10-CM | POA: Diagnosis not present

## 2023-12-11 DIAGNOSIS — Z803 Family history of malignant neoplasm of breast: Secondary | ICD-10-CM | POA: Diagnosis not present

## 2023-12-11 DIAGNOSIS — K219 Gastro-esophageal reflux disease without esophagitis: Secondary | ICD-10-CM | POA: Diagnosis present

## 2023-12-11 DIAGNOSIS — I38 Endocarditis, valve unspecified: Secondary | ICD-10-CM | POA: Diagnosis not present

## 2023-12-11 DIAGNOSIS — N182 Chronic kidney disease, stage 2 (mild): Secondary | ICD-10-CM | POA: Diagnosis not present

## 2023-12-11 DIAGNOSIS — I10 Essential (primary) hypertension: Secondary | ICD-10-CM | POA: Diagnosis present

## 2023-12-11 DIAGNOSIS — K580 Irritable bowel syndrome with diarrhea: Secondary | ICD-10-CM | POA: Diagnosis present

## 2023-12-11 DIAGNOSIS — Z823 Family history of stroke: Secondary | ICD-10-CM | POA: Diagnosis not present

## 2023-12-11 DIAGNOSIS — F419 Anxiety disorder, unspecified: Secondary | ICD-10-CM | POA: Diagnosis present

## 2023-12-11 DIAGNOSIS — Z83719 Family history of colon polyps, unspecified: Secondary | ICD-10-CM | POA: Diagnosis not present

## 2023-12-11 DIAGNOSIS — H6122 Impacted cerumen, left ear: Secondary | ICD-10-CM | POA: Diagnosis present

## 2023-12-11 DIAGNOSIS — E1122 Type 2 diabetes mellitus with diabetic chronic kidney disease: Secondary | ICD-10-CM | POA: Diagnosis not present

## 2023-12-11 DIAGNOSIS — Z79899 Other long term (current) drug therapy: Secondary | ICD-10-CM | POA: Diagnosis not present

## 2023-12-11 DIAGNOSIS — E785 Hyperlipidemia, unspecified: Secondary | ICD-10-CM | POA: Diagnosis present

## 2023-12-11 DIAGNOSIS — K449 Diaphragmatic hernia without obstruction or gangrene: Secondary | ICD-10-CM | POA: Diagnosis present

## 2023-12-11 DIAGNOSIS — R55 Syncope and collapse: Secondary | ICD-10-CM

## 2023-12-11 DIAGNOSIS — I129 Hypertensive chronic kidney disease with stage 1 through stage 4 chronic kidney disease, or unspecified chronic kidney disease: Secondary | ICD-10-CM | POA: Diagnosis not present

## 2023-12-11 DIAGNOSIS — E876 Hypokalemia: Secondary | ICD-10-CM | POA: Diagnosis present

## 2023-12-11 DIAGNOSIS — B379 Candidiasis, unspecified: Secondary | ICD-10-CM | POA: Diagnosis present

## 2023-12-11 DIAGNOSIS — R5381 Other malaise: Secondary | ICD-10-CM | POA: Diagnosis present

## 2023-12-11 DIAGNOSIS — Z7984 Long term (current) use of oral hypoglycemic drugs: Secondary | ICD-10-CM | POA: Diagnosis not present

## 2023-12-11 DIAGNOSIS — E1165 Type 2 diabetes mellitus with hyperglycemia: Secondary | ICD-10-CM | POA: Diagnosis present

## 2023-12-11 DIAGNOSIS — Z885 Allergy status to narcotic agent status: Secondary | ICD-10-CM | POA: Diagnosis not present

## 2023-12-11 LAB — CBC
HCT: 36.7 % (ref 36.0–46.0)
Hemoglobin: 11.9 g/dL — ABNORMAL LOW (ref 12.0–15.0)
MCH: 26.2 pg (ref 26.0–34.0)
MCHC: 32.4 g/dL (ref 30.0–36.0)
MCV: 80.8 fL (ref 80.0–100.0)
Platelets: 299 K/uL (ref 150–400)
RBC: 4.54 MIL/uL (ref 3.87–5.11)
RDW: 13.2 % (ref 11.5–15.5)
WBC: 6.1 K/uL (ref 4.0–10.5)
nRBC: 0 % (ref 0.0–0.2)

## 2023-12-11 LAB — ECHOCARDIOGRAM COMPLETE
Area-P 1/2: 3 cm2
Height: 65 in
S' Lateral: 2.2 cm
Weight: 2289.26 [oz_av]

## 2023-12-11 LAB — COMPREHENSIVE METABOLIC PANEL WITH GFR
ALT: 13 U/L (ref 0–44)
AST: 15 U/L (ref 15–41)
Albumin: 3.6 g/dL (ref 3.5–5.0)
Alkaline Phosphatase: 92 U/L (ref 38–126)
Anion gap: 9 (ref 5–15)
BUN: 8 mg/dL (ref 8–23)
CO2: 29 mmol/L (ref 22–32)
Calcium: 9.5 mg/dL (ref 8.9–10.3)
Chloride: 104 mmol/L (ref 98–111)
Creatinine, Ser: 0.77 mg/dL (ref 0.44–1.00)
GFR, Estimated: >60 mL/min (ref >60)
Glucose, Bld: 249 mg/dL — ABNORMAL HIGH (ref 70–99)
Potassium: 3.5 mmol/L (ref 3.5–5.1)
Sodium: 141 mmol/L (ref 135–145)
Total Bilirubin: 0.3 mg/dL (ref 0.0–1.2)
Total Protein: 6 g/dL — ABNORMAL LOW (ref 6.5–8.1)

## 2023-12-11 LAB — GLUCOSE, CAPILLARY
Glucose-Capillary: 222 mg/dL — ABNORMAL HIGH (ref 70–99)
Glucose-Capillary: 246 mg/dL — ABNORMAL HIGH (ref 70–99)
Glucose-Capillary: 250 mg/dL — ABNORMAL HIGH (ref 70–99)
Glucose-Capillary: 286 mg/dL — ABNORMAL HIGH (ref 70–99)

## 2023-12-11 MED ORDER — DIAZEPAM 5 MG/ML PO CONC
5.0000 mg | Freq: Once | ORAL | Status: DC
  Filled 2023-12-11: qty 1

## 2023-12-11 MED ORDER — LORAZEPAM 2 MG/ML IJ SOLN
1.0000 mg | Freq: Once | INTRAMUSCULAR | Status: AC
  Administered 2023-12-11: 1 mg via INTRAVENOUS
  Filled 2023-12-11: qty 1

## 2023-12-11 MED ORDER — INSULIN ASPART 100 UNIT/ML IJ SOLN
0.0000 [IU] | Freq: Every day | INTRAMUSCULAR | Status: DC
  Administered 2023-12-11: 3 [IU] via SUBCUTANEOUS
  Administered 2023-12-12: 2 [IU] via SUBCUTANEOUS
  Filled 2023-12-11 (×2): qty 3

## 2023-12-11 MED ORDER — INSULIN ASPART 100 UNIT/ML IJ SOLN
0.0000 [IU] | Freq: Three times a day (TID) | INTRAMUSCULAR | Status: DC
  Administered 2023-12-11 – 2023-12-12 (×2): 5 [IU] via SUBCUTANEOUS
  Administered 2023-12-12: 11 [IU] via SUBCUTANEOUS
  Administered 2023-12-13 (×3): 3 [IU] via SUBCUTANEOUS
  Filled 2023-12-11: qty 11
  Filled 2023-12-11: qty 5
  Filled 2023-12-11: qty 3
  Filled 2023-12-11: qty 5
  Filled 2023-12-11 (×2): qty 3

## 2023-12-11 NOTE — H&P (View-Only) (Signed)
   [facility] has been requested to perform a transesophageal echocardiogram on Stephanie Roach for evaluation of mobile echodensity seen on TTE.     The patient does NOT have any absolute or relative contraindications to a Transesophageal Echocardiogram (TEE).  The patient has: No other conditions that may impact this procedure.    After careful review of history and examination, the risks and benefits of transesophageal echocardiogram have been explained including risks of esophageal damage, perforation (1:10,000 risk), bleeding, pharyngeal hematoma as well as other potential complications associated with conscious sedation including aspiration, arrhythmia, respiratory failure and death. Alternatives to treatment were discussed, questions were answered. Patient is willing to proceed.   Bonney Artist Pouch, PA-C  12/11/2023 4:17 PM

## 2023-12-11 NOTE — Evaluation (Signed)
 Physical Therapy Evaluation Patient Details Name: Stephanie Roach MRN: [id number] DOB: Aug 21, 1935 Today's Date: 12/11/2023  History of Present Illness  88 yo pt admit with complaints of sudden onset of dizziness, sweatiness, pt sat down and then lost consciousness briefly, followed by vomiting. Typically in good health and independent with mobility ( no AD) in home and outside of home. Pt does c/o fullness,  fluid in her left ear and decreased hearing on the Left side as well. Lives with her daughter. PMH: DM, GERD  Clinical Impression  Pt tolerated session well. Completed orthostatic on pt with no reports onset of dizziness and BPS were WNLs. During entire session overall pt reported not feeling normal and have an overall fatigue and tired feeling about her.   Walked bilateral hand held assist ( since ppt does not use assistive device at home and wanted to assess safety). When scanning to the L while walking pt had onset of dizziness with side step and step over to correct slight LOB, but continued with step over step ambulation pattern with dizziness resolving instantly.   Performed modified hall pike side lying and rolling, with slight nystagmus when rolling to the Left side , resolving after about 30 seconds. End gaze eye movements, saccades and head trusts, WNL no nystagmus or dizziness noted. Reports dizziness with sudden movements, also reports Left ear feels full, decreased hearing recently and feel like  fluid .    Will continue to assess vestibular and mobility ability while here. Depending on her progression here, pt may need to continue with further vestibular at OPPT upon DC.     If plan is discharge home, recommend the following: A little help with walking and/or transfers;A little help with bathing/dressing/bathroom;Assist for transportation   Can travel by private vehicle        Equipment Recommendations None recommended by PT  Recommendations for Other Services        Functional Status Assessment Patient has had a recent decline in their functional status and demonstrates the ability to make significant improvements in function in a reasonable and predictable amount of time.     Precautions / Restrictions Precautions Precautions:  (pt reports no falls)      Mobility  Bed Mobility Overal bed mobility: Modified Independent                  Transfers Overall transfer level: Modified independent                 General transfer comment: stands up very quickly    Ambulation/Gait Ambulation/Gait assistance: Contact guard assist, +2 safety/equipment Gait Distance (Feet): 180 Feet Assistive device: 2 person hand held assist Gait Pattern/deviations: Step-through pattern       General Gait Details: Pt stated she just didn't feel  normal very weak and tired feeelign all over. Bilateral hand held assist due to her admittign diagnosis and wanted to be careful. Wtih pt scanning R and L during ambulation , she did have 1 episode of dizziness when scan with head turn to the LEFT with need for ModA to LOB and self correction. like a quick sidestep , step over to catch herself and resolved instantly without her missing a step.  Stairs            Wheelchair Mobility     Tilt Bed    Modified Rankin (Stroke Patients Only)       Balance Overall balance assessment: Needs assistance Sitting-balance support: Feet supported Sitting balance-Leahy Scale:  Normal     Standing balance support: During functional activity, Bilateral upper extremity supported Standing balance-Leahy Scale: Fair Standing balance comment: not formally tested was during functional walking with LOB when head turn during forward progression                             Pertinent Vitals/Pain Pain Assessment Pain Assessment: No/denies pain    Home Living Family/patient expects to be discharged to:: Private residence Living Arrangements: Children  (lives with dtr and grandson) Available Help at Discharge: Family Type of Home: House Home Access: Stairs to enter Entrance Stairs-Rails: Left Entrance Stairs-Number of Steps: 3 (states she doesn't have any problem with stairs)   Home Layout: One level        Prior Function Prior Level of Function : Independent/Modified Independent             Mobility Comments: no AD       Extremity/Trunk Assessment        Lower Extremity Assessment Lower Extremity Assessment: Generalized weakness (MMT were WNL but pt reported  I feel weak all over)       Communication   Communication Communication: No apparent difficulties    Cognition Arousal: Alert Behavior During Therapy: WFL for tasks assessed/performed   PT - Cognitive impairments: No apparent impairments                         Following commands: Intact       Cueing Cueing Techniques: Verbal cues, Gestural cues     General Comments      Exercises     Assessment/Plan    PT Assessment Patient needs continued PT services  PT Problem List Decreased strength;Decreased activity tolerance;Decreased mobility       PT Treatment Interventions Gait training;Functional mobility training;Therapeutic activities;Therapeutic exercise;DME instruction;Patient/family education;Neuromuscular re-education    PT Goals (Current goals can be found in the Care Plan section)  Acute Rehab PT Goals Patient Stated Goal: I want to feel better and not be dizzy. I was fine until this happened PT Goal Formulation: With patient Time For Goal Achievement: 12/25/23 Potential to Achieve Goals: Good    Frequency Min 2X/week     Co-evaluation               AM-PAC PT 6 Clicks Mobility  Outcome Measure Help needed turning from your back to your side while in a flat bed without using bedrails?: None Help needed moving from lying on your back to sitting on the side of a flat bed without using bedrails?: None Help  needed moving to and from a bed to a chair (including a wheelchair)?: None Help needed standing up from a chair using your arms (e.g., wheelchair or bedside chair)?: None Help needed to walk in hospital room?: A Little Help needed climbing 3-5 steps with a railing? : A Little 6 Click Score: 22    End of Session Equipment Utilized During Treatment: Gait belt Activity Tolerance: Patient tolerated treatment well Patient left: in chair;with call bell/phone within reach;with chair alarm set Nurse Communication: Mobility status PT Visit Diagnosis: Other abnormalities of gait and mobility (R26.89);Unsteadiness on feet (R26.81)    Time: 1200-1230 PT Time Calculation (min) (ACUTE ONLY): 30 min   Charges:   PT Evaluation $PT Eval Low Complexity: 1 Low PT Treatments $Gait Training: 8-22 mins PT General Charges $$ ACUTE PT VISIT: 1 Visit  Roach, PT, MPT Acute Rehabilitation Services Office: [phone] If a weekend: secure chat groups: [facility] PT, [facility] OT, [facility] SLP 12/11/2023   Stephanie Roach 12/11/2023, 6:58 PM

## 2023-12-11 NOTE — Plan of Care (Signed)
  Problem: Education: Goal: Knowledge of General Education information will improve Description: Including pain rating scale, medication(s)/side effects and non-pharmacologic comfort measures Outcome: Progressing   Problem: Health Behavior/Discharge Planning: Goal: Ability to manage health-related needs will improve Outcome: Progressing   Problem: Clinical Measurements: Goal: Diagnostic test results will improve Outcome: Progressing Goal: Respiratory complications will improve Outcome: Progressing   Problem: Clinical Measurements: Goal: Ability to maintain clinical measurements within normal limits will improve Outcome: Not Progressing Goal: Will remain free from infection Outcome: Not Progressing

## 2023-12-11 NOTE — Progress Notes (Signed)
  Progress Note   Patient: Stephanie Roach FMW:[id number] DOB: Mar 03, 1935 DOA: 12/10/2023     0 DOS: the patient was seen and examined on 12/11/2023        Brief hospital course: 88 y.o. F with DM HTN, recent Diflucan for yeast infection presented with vertigo, syncope or near-syncope.         Assessment and Plan: Syncope Vertigo Possible aortic vegetation Patient's daughter provides more report that the patient became suddenly weak and profusely sweaty yesterday, then lost consciousness briefly.  She also had new onset vertigo.  Echocardiogram here shows mobile echodensity on AV - Obtain TEE - Obtain MRI brain   Diabetes with hypergylcemia - Continue new glargine - Increase SSI  Hyperlipidemia -  COntinue crestor          Subjective: Still feeling dizzy and ataxic.  Denies focal wekaness, numbness, vision changes, confusion, no fever.  Sugars still high.  TTE shows mobile AV mass.     Physical Exam: BP 137/72 (BP Location: Right Arm)   Pulse 88   Temp 98.2 F (36.8 C) (Oral)   Resp 19   Ht 5' 5 (1.651 m)   Wt 64.9 kg   SpO2 93%   BMI 23.81 kg/m   General: Pt is alert, awake, not in acute distress, sitting up in bed Cardiovascular: RRR, nl S1-S2, I actually don't appreciate any murmurs.   No LE edema.   Respiratory: Normal respiratory rate and rhythm.  CTAB without rales or wheezes. Abdominal: Abdomen soft and non-tender.  No distension or HSM.   Neuro/Psych: Pupils are equal and reactive.  Extraocular movements are intact, without nystagmus.  Motor strength testing is 5/5 in the upper and lower extremities bilaterally with normal motor, tone and bulk.  Sensory examination is intact to light touch.   Finger-to-nose testing is within normal limits. The patient is oriented to time, place and person.  Speech is fluent.  Naming is grossly intact.  Recall, recent and remote, as well as general fund of knowledge seem within normal limits.  Attention span and  concentration are within normal limits.   Data Reviewed: Discussed with Cardiology BMP shows hyperglycemia, otherwise noraml electrolytes and renal function CBC shows normal count, mild anemia   Family Communication: daughter    Disposition: Status is: Observation         Author: Lonni SHAUNNA Dalton, MD 12/11/2023 2:51 PM  For on call review [email or web address].

## 2023-12-11 NOTE — Progress Notes (Signed)
  Echocardiogram 2D Echocardiogram has been performed.  Stephanie Roach 12/11/2023, 2:43 PM

## 2023-12-11 NOTE — Progress Notes (Signed)
   [facility] has been requested to perform a transesophageal echocardiogram on Stephanie Roach for evaluation of mobile echodensity seen on TTE.     The patient does NOT have any absolute or relative contraindications to a Transesophageal Echocardiogram (TEE).  The patient has: No other conditions that may impact this procedure.    After careful review of history and examination, the risks and benefits of transesophageal echocardiogram have been explained including risks of esophageal damage, perforation (1:10,000 risk), bleeding, pharyngeal hematoma as well as other potential complications associated with conscious sedation including aspiration, arrhythmia, respiratory failure and death. Alternatives to treatment were discussed, questions were answered. Patient is willing to proceed.   Bonney Artist Pouch, PA-C  12/11/2023 4:17 PM

## 2023-12-11 NOTE — Plan of Care (Signed)
  Problem: Education: Goal: Knowledge of General Education information will improve Description: Including pain rating scale, medication(s)/side effects and non-pharmacologic comfort measures Outcome: Progressing   Problem: Health Behavior/Discharge Planning: Goal: Ability to manage health-related needs will improve Outcome: Progressing   Problem: Clinical Measurements: Goal: Ability to maintain clinical measurements within normal limits will improve Outcome: Progressing Goal: Will remain free from infection Outcome: Progressing Goal: Diagnostic test results will improve Outcome: Progressing   Problem: Activity: Goal: Risk for activity intolerance will decrease Outcome: Progressing   Problem: Nutrition: Goal: Adequate nutrition will be maintained Outcome: Progressing   Problem: Coping: Goal: Level of anxiety will decrease Outcome: Progressing   Problem: Elimination: Goal: Will not experience complications related to urinary retention Outcome: Progressing   Problem: Safety: Goal: Ability to remain free from injury will improve Outcome: Progressing   Problem: Education: Goal: Ability to describe self-care measures that may prevent or decrease complications (Diabetes Survival Skills Education) will improve Outcome: Progressing   Problem: Coping: Goal: Ability to adjust to condition or change in health will improve Outcome: Progressing   Problem: Fluid Volume: Goal: Ability to maintain a balanced intake and output will improve Outcome: Progressing   Problem: Health Behavior/Discharge Planning: Goal: Ability to identify and utilize available resources and services will improve Outcome: Progressing Goal: Ability to manage health-related needs will improve Outcome: Progressing   Problem: Metabolic: Goal: Ability to maintain appropriate glucose levels will improve Outcome: Progressing

## 2023-12-11 NOTE — Hospital Course (Signed)
 88 y.o. F with DM HTN, recent Diflucan for yeast infection presented with vertigo, syncope or near-syncope.

## 2023-12-12 ENCOUNTER — Encounter (HOSPITAL_COMMUNITY): Payer: Self-pay | Admitting: Family Medicine

## 2023-12-12 ENCOUNTER — Inpatient Hospital Stay (HOSPITAL_COMMUNITY): Admitting: Anesthesiology

## 2023-12-12 ENCOUNTER — Encounter (HOSPITAL_COMMUNITY)
Admission: EM | Disposition: A | Discharge status: Home / Self Care | Payer: Self-pay | Source: Home / Self Care | Attending: Family Medicine

## 2023-12-12 ENCOUNTER — Inpatient Hospital Stay (HOSPITAL_COMMUNITY)

## 2023-12-12 DIAGNOSIS — I129 Hypertensive chronic kidney disease with stage 1 through stage 4 chronic kidney disease, or unspecified chronic kidney disease: Secondary | ICD-10-CM

## 2023-12-12 DIAGNOSIS — E1122 Type 2 diabetes mellitus with diabetic chronic kidney disease: Secondary | ICD-10-CM

## 2023-12-12 DIAGNOSIS — N182 Chronic kidney disease, stage 2 (mild): Secondary | ICD-10-CM

## 2023-12-12 DIAGNOSIS — I358 Other nonrheumatic aortic valve disorders: Secondary | ICD-10-CM

## 2023-12-12 DIAGNOSIS — I38 Endocarditis, valve unspecified: Secondary | ICD-10-CM

## 2023-12-12 HISTORY — PX: TRANSESOPHAGEAL ECHOCARDIOGRAM (CATH LAB): EP1270

## 2023-12-12 LAB — CBC
HCT: 40 % (ref 36.0–46.0)
Hemoglobin: 12.6 g/dL (ref 12.0–15.0)
MCH: 25.9 pg — ABNORMAL LOW (ref 26.0–34.0)
MCHC: 31.5 g/dL (ref 30.0–36.0)
MCV: 82.1 fL (ref 80.0–100.0)
Platelets: 297 K/uL (ref 150–400)
RBC: 4.87 MIL/uL (ref 3.87–5.11)
RDW: 13.2 % (ref 11.5–15.5)
WBC: 5.3 K/uL (ref 4.0–10.5)
nRBC: 0 % (ref 0.0–0.2)

## 2023-12-12 LAB — COMPREHENSIVE METABOLIC PANEL WITH GFR
ALT: 13 U/L (ref 0–44)
AST: 16 U/L (ref 15–41)
Albumin: 3.9 g/dL (ref 3.5–5.0)
Alkaline Phosphatase: 101 U/L (ref 38–126)
Anion gap: 13 (ref 5–15)
BUN: 12 mg/dL (ref 8–23)
CO2: 26 mmol/L (ref 22–32)
Calcium: 9.3 mg/dL (ref 8.9–10.3)
Chloride: 103 mmol/L (ref 98–111)
Creatinine, Ser: 0.56 mg/dL (ref 0.44–1.00)
GFR, Estimated: >60 mL/min (ref >60)
Glucose, Bld: 209 mg/dL — ABNORMAL HIGH (ref 70–99)
Potassium: 3.6 mmol/L (ref 3.5–5.1)
Sodium: 142 mmol/L (ref 135–145)
Total Bilirubin: 0.3 mg/dL (ref 0.0–1.2)
Total Protein: 6.7 g/dL (ref 6.5–8.1)

## 2023-12-12 LAB — GLUCOSE, CAPILLARY
Glucose-Capillary: 209 mg/dL — ABNORMAL HIGH (ref 70–99)
Glucose-Capillary: 137 mg/dL — ABNORMAL HIGH (ref 70–99)
Glucose-Capillary: 317 mg/dL — ABNORMAL HIGH (ref 70–99)
Glucose-Capillary: 217 mg/dL — ABNORMAL HIGH (ref 70–99)

## 2023-12-12 SURGERY — TRANSESOPHAGEAL ECHOCARDIOGRAM (TEE) (CATHLAB)
Anesthesia: Monitor Anesthesia Care

## 2023-12-12 MED ORDER — PHENYLEPHRINE 80 MCG/ML (10ML) SYRINGE FOR IV PUSH (FOR BLOOD PRESSURE SUPPORT)
PREFILLED_SYRINGE | INTRAVENOUS | Status: DC | PRN
  Administered 2023-12-12: 160 ug via INTRAVENOUS
  Administered 2023-12-12: 20 ug via INTRAVENOUS

## 2023-12-12 MED ORDER — SODIUM CHLORIDE 0.9 % IV SOLN: INTRAVENOUS | Status: DC

## 2023-12-12 MED ORDER — PROPOFOL 10 MG/ML IV BOLUS
INTRAVENOUS | Status: DC | PRN
  Administered 2023-12-12: 80 ug/kg/min via INTRAVENOUS
  Administered 2023-12-12: 70 mg via INTRAVENOUS

## 2023-12-12 NOTE — Progress Notes (Signed)
 Echocardiogram Echocardiogram Transesophageal has been performed.  Stephanie Roach 12/12/2023, 2:09 PM

## 2023-12-12 NOTE — Progress Notes (Signed)
  Progress Note   Patient: Stephanie Roach FMW:[id number] DOB: [birth year] DOA: 12/10/2023     1 DOS: the patient was seen and examined on 12/12/2023 at 15:30      Brief hospital course: 88 y.o. F with DM HTN, recent Diflucan for yeast infection presented with vertigo, syncope or near-syncope.         Assessment and Plan: Aortic fibroelastoma Admitted and found to have 0.4cm mobile echodensity on the left coronary cusps of the aortic valve.  TEE today limited by hiatal hernia, but confirms likely fibroelastoma.  Discussed with cardiology cardiothoracic surgery.    Given her vertigo and ataxia lasted >24 hours, and MRI showed no evidence of infarct, I think TIA is very unlikely.  No constitutional symptoms to suggest infection. - Obtain blood cultures - CT C/A/P - If imaging shows no evidence of embolization, cardiology appear to favor watchful waiting   Vertigo Resolved  Syncope Patient developed prodrome, then passed out, went limp, was unresponsive.  Echocardiogram shows fibroelastoma as above.  MRI brain shows no acute change.  No further episodes of syncope, no arrhythmia on telemetry.  Diabetes with hypergylcemia Glucose improved this afternoon - Continue glargine - Continue sliding scale corrections - Will need glipizide or Lantus at discharge   Hyperlipidemia -Continue rosuvastatin   Large hiatal hernia       Subjective: Patient is feeling well, her ataxia is improved.  She has had no fever, confusion, respiratory symptoms.     Physical Exam: BP 139/80 (BP Location: Left Arm)   Pulse 80   Temp 98.2 F (36.8 C) (Temporal)   Resp 16   Ht 5' 5 (1.651 m)   Wt 64.9 kg   SpO2 95%   BMI 23.81 kg/m   Well adult female, sitting in bed, interactive and appropriate RRR, no murmurs, no peripheral edema Respiratory rate normal, lungs clear without rales or wheezes Abdomen soft no tenderness palpation or guarding, no ascites or distention Attention normal,  affect normal, judgment and insight appear baseline, mild short-term memory loss, moves upper extremities and lower extremities with normal strength and coordination, extraocular movements intact, face symmetric, no ataxia    Data Reviewed: Discussed with cardiology Basic metabolic panel normal CBC normal     Family Communication: daughter by phone    Disposition: Status is: Inpatient         Author: Lonni SHAUNNA Dalton, MD 12/12/2023 4:54 PM  For on call review [email or web address].

## 2023-12-12 NOTE — Inpatient Diabetes Management (Signed)
 Inpatient Diabetes Program Recommendations  AACE/ADA: New Consensus Statement on Inpatient Glycemic Control (2015)  Target Ranges:  Prepandial:   less than 140 mg/dL      Peak postprandial:   less than 180 mg/dL (1-2 hours)      Critically ill patients:  140 - 180 mg/dL   Lab Results  Component Value Date   GLUCAP 209 (H) 12/12/2023   HGBA1C 8.7 (H) 08/10/2023    Review of Glycemic Control  Latest Reference Range & Units 12/11/23 07:49 12/11/23 12:03 12/11/23 17:04 12/11/23 21:16 12/12/23 07:38  Glucose-Capillary 70 - 99 mg/dL 777 (H) 753 (H) 749 (H) 286 (H) 209 (H)  (H): Data is abnormally high  Diabetes history: DM2 Outpatient Diabetes medications: None listed Current orders for Inpatient glycemic control: Semglee 5 units every day, Novolog 0-15 units TID and 0-5 units QHS  Inpatient Diabetes Program Recommendations:    Please consider increasing basal insulin :  Semglee 10 units at bedtime  Thank you, Wyvonna Pinal, MSN, CDCES Diabetes Coordinator Inpatient Diabetes Program [phone] (team pager from 8a-5p)

## 2023-12-12 NOTE — Plan of Care (Signed)
  Problem: Education: Goal: Knowledge of General Education information will improve Description: Including pain rating scale, medication(s)/side effects and non-pharmacologic comfort measures Outcome: Progressing   Problem: Clinical Measurements: Goal: Respiratory complications will improve Outcome: Progressing   Problem: Health Behavior/Discharge Planning: Goal: Ability to manage health-related needs will improve Outcome: Not Progressing   Problem: Clinical Measurements: Goal: Ability to maintain clinical measurements within normal limits will improve Outcome: Not Progressing Goal: Will remain free from infection Outcome: Not Progressing Goal: Diagnostic test results will improve Outcome: Not Progressing Goal: Cardiovascular complication will be avoided Outcome: Not Progressing

## 2023-12-12 NOTE — Interval H&P Note (Signed)
 History and Physical Interval Note:  12/12/2023 10:38 AM  Stephanie Roach  has presented today for surgery, with the diagnosis of echo density of 2decho.  The various methods of treatment have been discussed with the patient and family. After consideration of risks, benefits and other options for treatment, the patient has consented to  Procedure(s): TRANSESOPHAGEAL ECHOCARDIOGRAM (N/A) as a surgical intervention.  The patient's history has been reviewed, patient examined, no change in status, stable for surgery.  I have reviewed the patient's chart and labs.  Questions were answered to the patient's satisfaction.     Stephanie Scarce, MD

## 2023-12-12 NOTE — Progress Notes (Signed)
 PT Cancellation Note  Patient Details Name: Stephanie Roach MRN: [id number] DOB: 1935/05/24   Cancelled Treatment:    Reason Eval/Treat Not Completed: Patient at procedure or test/unavailable Pt at [facility] for procedure.  Will f/u as able.  Benjiman, PT Acute Rehab [facility] [phone]   Benjiman VEAR Mulberry 12/12/2023, 10:28 AM

## 2023-12-12 NOTE — Transfer of Care (Signed)
 Immediate Anesthesia Transfer of Care Note  Patient: Stephanie Roach  Procedure(s) Performed: TRANSESOPHAGEAL ECHOCARDIOGRAM  Patient Location: PACU and Cath Lab  Anesthesia Type:MAC  Level of Consciousness: drowsy  Airway & Oxygen Therapy: Patient Spontanous Breathing and Patient connected to nasal cannula oxygen  Post-op Assessment: Report given to RN and Post -op Vital signs reviewed and stable  Post vital signs: Reviewed and stable  Last Vitals:  Vitals Value Taken Time  BP 99/58 1158  Temp    Pulse 76   Resp 20   SpO2 93     Last Pain:  Vitals:   12/12/23 1008  TempSrc: Temporal  PainSc:       Patients Stated Pain Goal: 0 (12/11/23 1809)  Complications: No notable events documented.

## 2023-12-12 NOTE — Anesthesia Preprocedure Evaluation (Signed)
 Anesthesia Evaluation  Patient identified by MRN, date of birth, ID band Patient awake    Reviewed: Allergy & Precautions, NPO status , Patient's Chart, lab work & pertinent test results  History of Anesthesia Complications Negative for: history of anesthetic complications  Airway Mallampati: II  TM Distance: >3 FB Neck ROM: Full    Dental  (+) Dental Advisory Given, Teeth Intact   Pulmonary neg shortness of breath, sleep apnea , neg COPD, neg recent URI   breath sounds clear to auscultation       Cardiovascular hypertension, (-) angina (-) Past MI and (-) CHF  Rhythm:Regular  1. Left ventricular ejection fraction, by estimation, is 65 to 70%. The  left ventricle has normal function. The left ventricle has no regional  wall motion abnormalities. Left ventricular diastolic parameters are  consistent with Grade I diastolic  dysfunction (impaired relaxation).   2. Right ventricular systolic function is normal. The right ventricular  size is normal.   3. The mitral valve is normal in structure. Mild mitral valve  regurgitation. No evidence of mitral stenosis.   4. The aortic valve is tricuspid. There is no aortic regurgitation or  stenosis. There is a small echogenic independently mobile density on the  left coronary cusp of the aortic valve measuring 0.4 cm x 0.4 cm. The  differential includes small papillary  fibroelastoma vs lambl excrescence vs calcification. Less likely  endocarditis in the absence of symptoms.   5. The inferior vena cava is normal in size with greater than 50%  respiratory variability, suggesting right atrial pressure of 3 mmHg.     Neuro/Psych  Headaches PSYCHIATRIC DISORDERS Anxiety Depression     Neuromuscular disease    GI/Hepatic Neg liver ROS, hiatal hernia,GERD  Medicated and Controlled,,  Endo/Other  diabetes    Renal/GU Lab Results      Component                Value               Date                       NA                       142                 12/12/2023                K                        3.6                 12/12/2023                CO2                      26                  12/12/2023                GLUCOSE                  209 (H)             12/12/2023                BUN  12                  12/12/2023                CREATININE               0.56                12/12/2023                CALCIUM                   9.3                 12/12/2023                GFR                      82.40               08/10/2023                EGFR                     87                  12/09/2022                GFRNONAA                 >60                 12/12/2023                Musculoskeletal  (+) Arthritis ,    Abdominal   Peds  Hematology Lab Results      Component                Value               Date                      WBC                      5.3                 12/12/2023                HGB                      12.6                12/12/2023                HCT                      40.0                12/12/2023                MCV                      82.1                12/12/2023                PLT  297                 12/12/2023              Anesthesia Other Findings   Reproductive/Obstetrics                              Anesthesia Physical Anesthesia Plan  ASA: 2  Anesthesia Plan: MAC   Post-op Pain Management: Minimal or no pain anticipated   Induction: Intravenous  PONV Risk Score and Plan: 2 and Propofol  infusion and Treatment may vary due to age or medical condition  Airway Management Planned: Nasal Cannula, Natural Airway and Simple Face Mask  Additional Equipment: None  Intra-op Plan:   Post-operative Plan:   Informed Consent: I have reviewed the patients History and Physical, chart, labs and discussed the procedure including the risks, benefits and alternatives  for the proposed anesthesia with the patient or authorized representative who has indicated his/her understanding and acceptance.     Dental advisory given  Plan Discussed with: CRNA  Anesthesia Plan Comments:          Anesthesia Quick Evaluation

## 2023-12-12 NOTE — CV Procedure (Signed)
 Brief TEE Note  Image quality severely limited by large hiatal hernia. Unable to advance the probe past 28 cm. Views of the aortic valve were difficult to obtain.  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 LVEF 60-65% No LA/LAA thrombus or masses.  Given poor quality, consider cardiac MRI if the patient is thought to be a potential surgical candidate.   For additional information see full report.  Haislee Corso C. Raford, MD, [facility] 12/12/2023 11:56 AM

## 2023-12-12 NOTE — Plan of Care (Signed)
?  Problem: Education: ?Goal: Knowledge of General Education information will improve ?Description: Including pain rating scale, medication(s)/side effects and non-pharmacologic comfort measures ?Outcome: Progressing ?  ?Problem: Clinical Measurements: ?Goal: Will remain free from infection ?Outcome: Progressing ?  ?Problem: Health Behavior/Discharge Planning: ?Goal: Ability to manage health-related needs will improve ?Outcome: Not Progressing ?  ?Problem: Clinical Measurements: ?Goal: Ability to maintain clinical measurements within normal limits will improve ?Outcome: Not Progressing ?Goal: Diagnostic test results will improve ?Outcome: Not Progressing ?Goal: Respiratory complications will improve ?Outcome: Not Progressing ?Goal: Cardiovascular complication will be avoided ?Outcome: Not Progressing ?  ?

## 2023-12-12 NOTE — Consult Note (Signed)
  Cardiology Consultation:  Patient ID: Stephanie Roach MRN: [id number]; DOB: 1935-03-24 Admit date: 12/10/2023 Date of Consult: 12/12/2023  Primary Care Provider: Billy Knee, FNP Primary Cardiologist: None  Primary Electrophysiologist:  None   Patient Profile:  Stephanie Roach is a 88 y.o. female with a hx of HTN, HLD who is being seen today for the evaluation of PFE at the request of Dr. Jonel.  History of Present Illness:  [name] presents with an episode of syncope. A couple of days ago, patient was walking to the kitchen and started to have trouble with her balance. She became weak and then lost consciousness briefly. She denies any room spinning, tongue bitting, fecal or urinary incontinence. She denies any chest palpitations or blurry vision prior to the episode.   Past Medical History: HTN, HLD  Past Surgical History: Noncontributory  Allergies:    Allergies  Allergen Reactions   Morphine And Codeine     Unknown     Social History: No tobacco use  Family History:   Noncontributory  ROS:  All other ROS reviewed and negative. Pertinent positives noted in the HPI.     Physical Exam/Data:   Vitals:   12/12/23 1220 12/12/23 1225 12/12/23 1230 12/12/23 1423  BP: 129/81 139/79 (!) 141/70 139/80  Pulse: 80 77 81 80  Resp: (!) 24 16 14 16   Temp:      TempSrc:      SpO2: 95% 93% 95% 95%  Weight:      Height:        Intake/Output Summary (Last 24 hours) at 12/12/2023 1642 Last data filed at 12/12/2023 1150 Gross per 24 hour  Intake 237 ml  Output 0 ml  Net 237 ml       12/11/2023    6:00 AM 12/10/2023    3:58 PM 12/07/2023    2:26 PM  Last 3 Weights  Weight (lbs) 143 lb 1.3 oz 138 lb 141 lb 6.4 oz  Weight (kg) 64.9 kg 62.596 kg 64.139 kg    Body mass index is 23.81 kg/m.  General: Well nourished, well developed, in no acute distress HEENT: Atraumatic, no JVD Cardiac: Normal S1, S2; RRR; no murmurs, rubs, or gallops Lungs:CTAB, no wheezing,  rhonchi or rales  Abd: Soft, nontender, no hepatomegaly  Ext: No edema, pulses 2+ Skin: Warm and dry, no rashes     Relevant CV Studies: Reviewed Assessment and Plan:  Stephanie Roach is a 88 y.o. female with a hx of HTN, HLD who is being seen today for the evaluation of PFE at the request of Dr. Jonel.  #Mobile echodensity on AV valve #Syncope - Presenting with an episode of syncope and found to have mobile mass on the AV valve suggestive of PFE on TTE and TEE. Measures 0.4 cm and no evidence of embolic events on brain MRI. Will obtain CTC/A/P to screen embolic phenomenon. If no embolism and given smaller size (< 1 cm), there would be no indication for surgery at this time.  - Will repeat TTE in 3 months to assess mass and for AI - We will continue to follow  For questions or updates, please contact [facility] Please consult [email or web address] for contact info under    Signed, Joelle DEL. Ren Ny, MD, [facility]  [facility]  12/12/2023 4:42 PM

## 2023-12-13 ENCOUNTER — Inpatient Hospital Stay (HOSPITAL_COMMUNITY)

## 2023-12-13 ENCOUNTER — Encounter (HOSPITAL_COMMUNITY): Payer: Self-pay | Admitting: Family Medicine

## 2023-12-13 DIAGNOSIS — I358 Other nonrheumatic aortic valve disorders: Secondary | ICD-10-CM | POA: Diagnosis not present

## 2023-12-13 LAB — GLUCOSE, CAPILLARY
Glucose-Capillary: 199 mg/dL — ABNORMAL HIGH (ref 70–99)
Glucose-Capillary: 198 mg/dL — ABNORMAL HIGH (ref 70–99)
Glucose-Capillary: 189 mg/dL — ABNORMAL HIGH (ref 70–99)

## 2023-12-13 LAB — ECHO TEE

## 2023-12-13 MED ORDER — IOHEXOL 9 MG/ML PO SOLN
ORAL | Status: AC
  Filled 2023-12-13: qty 1000

## 2023-12-13 MED ORDER — IOHEXOL 9 MG/ML PO SOLN
500.0000 mL | ORAL | Status: AC
  Administered 2023-12-13 (×2): 500 mL via ORAL

## 2023-12-13 MED ORDER — SODIUM CHLORIDE (PF) 0.9 % IJ SOLN
INTRAMUSCULAR | Status: AC
  Filled 2023-12-13: qty 50

## 2023-12-13 MED ORDER — GLIPIZIDE 5 MG PO TABS: 5.0000 mg | ORAL_TABLET | Freq: Every day | ORAL | 0 refills | Status: DC

## 2023-12-13 MED ORDER — IOHEXOL 300 MG/ML  SOLN
100.0000 mL | Freq: Once | INTRAMUSCULAR | Status: AC | PRN
  Administered 2023-12-13: 100 mL via INTRAVENOUS

## 2023-12-13 NOTE — Discharge Summary (Signed)
 Physician Discharge Summary   Patient: Stephanie Roach MRN: [id number] DOB: 17-Jun-1935  Admit date:     12/10/2023  Discharge date: 12/13/23  Discharge Physician: Lonni SHAUNNA Dalton   PCP: Billy Knee, FNP     Recommendations at discharge:  Follow up with PCP Knee Billy in 1 week for vertigo Follow up with Cardiology Dr. Azobou Tonleu for aortic valve fibroelastoma Dr. Ren Tonleu: Please obtain echo in 3 months     Discharge Diagnoses: Principal Problem:   Vertigo Other hospital problems   Diabetes   Hypertension   Near syncope   Aortic fibroelastoma   Hiatal hernia     Hospital Course: 88 y.o. F with DM HTN, recent Diflucan for yeast infection presented with vertigo, syncope or near-syncope.    Patient was in her usual state of health until the day of admission, developed a sense of spinning.  Then later had an episode of prodrome of malaise, then sweats then passed out, so family brought her to the ER.  In the ER, CTH normal, CXR clear, no signs of infection, blood counts, electroltes and renal function normal.  Admitted for vertigo.  Vertigo resolved quickly but found to have aortic fibroelastoma and remained in hospital for work up.     Assessment and Plan: Aortic fibroelastoma Admitted and found to have 0.4cm mobile echodensity on the left coronary cusps of the aortic valve.  TEE limited by hiatal hernia, but confirms fibroelastoma.  Discussed with cardiology and cardiothoracic surgery.  No constitutional symptoms, blood cultures negative at discharge.   Given mass <1cm, and no evidnece of embolism on MRI brain, CT C/A/P or exam, Cardiology recommend surveillance.   - Echo in 3 months    Vertigo Resolved.   Diabetes with hypergylcemia Patient nonadherent to home metformin .  Here, she was started on glipizide.  Daughter confirmed she has a glucometer.  Recommend close follow up with PCP    Hyperlipidemia Stable on Crestor    Large hiatal  hernia           The [place]  Controlled Substances Registry was reviewed for this patient prior to discharge.  Consultants: Cardiology Procedures performed: Echocardiogram TEE MRI brain CT C/A/P   Disposition: Home Diet recommendation:  Carb modified diet  DISCHARGE MEDICATION: Allergies as of 12/13/2023       Reactions   Morphine And Codeine    Unknown        Medication List     TAKE these medications    Accu-Chek Guide w/Device Kit USE TO CHECK BLOOD SUGAR ONCE DAILY OR AS DIRECTED.   glipiZIDE 5 MG tablet Commonly known as: GLUCOTROL Take 1 tablet (5 mg total) by mouth daily before breakfast.   LORazepam 1 MG tablet Commonly known as: ATIVAN Take 1 mg by mouth 2 (two) times daily.   omeprazole  20 MG capsule Commonly known as: PRILOSEC Take by mouth.   rosuvastatin  5 MG tablet Commonly known as: Crestor  Take 1 tablet (5 mg total) by mouth daily.   traZODone  50 MG tablet Commonly known as: DESYREL  Take  1 to 2 tablets  1 hour  before Bedtime as needed for Sleep   venlafaxine  XR 150 MG 24 hr capsule Commonly known as: EFFEXOR -XR Take 150 mg by mouth at bedtime.   Vitamin D3 125 MCG (5000 UT) Caps Take by mouth.        Follow-up Information     Billy Knee, FNP. Schedule an appointment as soon as possible for a visit in 1  week(s).   Specialty: Internal Medicine Contact information: [address] [place] [address] [phone]         Ren Donley Joelle VEAR, MD Follow up.   Specialty: Cardiology Contact information: [address] [place] [address] [phone]                 Discharge Instructions     Discharge instructions   Complete by: As directed    **IMPORTANT DISCHARGE INSTRUCTIONS**   From Dr. Jonel: You were admitted with vertigo (spinning sensation)  Thankfully, this is getting better.    While you were here, we noticed two incidental findings: 1. Your  blood sugars are dangerously high -- you should start a medicine for that and follow up with your PCP 2. You have a small fibroelastoma on your aortic valve   For the diabetes/high blood sugar: Check your sugar once daily and write it down If your sugars are consistently MORE THAN 200, you need to call your doctor to be seen If you notice LOW BLOOD SUGAR SYMPTOMS (nausea, shakiness, clamminess, weakness) check your sugar immediately.  If it is LESS THAN 70, drink some juice and call your doctor   Take glipizide 5 mg daily to lower blood sugar Go see your primary doctor in 1 week, she will likely adjust this or switch it to something else.   For the fibroelastoma: This is a benign growth We did testing and we see no evidence that it is causing harm It should be monitored, but an accidental finding like this sometimes is completely asymptomatic It should be monitored by a Cardiologist, I have sent Dr. Ren Peng office a message to schedule you. Call them if you haven't heard from them in 2 weeks   Increase activity slowly   Complete by: As directed        Discharge Exam: Filed Weights   12/10/23 1558 12/11/23 0600  Weight: 62.6 kg 64.9 kg    General: Pt is alert, awake, not in acute distress Cardiovascular: RRR, nl S1-S2, no murmurs appreciated.   No LE edema.   Respiratory: Normal respiratory rate and rhythm.  CTAB without rales or wheezes. Abdominal: Abdomen soft and non-tender.  No distension or HSM.   Neuro/Psych: Strength symmetric in upper and lower extremities.  Judgment and insight appear normal.   Condition at discharge: good  The results of significant diagnostics from this hospitalization (including imaging, microbiology, ancillary and laboratory) are listed below for reference.   Imaging Studies: CT CHEST ABDOMEN PELVIS W CONTRAST Result Date: 12/13/2023 CLINICAL DATA:  Aortic fibroelastoma, screening for embolic phenomena EXAM: CT CHEST, ABDOMEN, AND  PELVIS WITH CONTRAST TECHNIQUE: Multidetector CT imaging of the chest, abdomen and pelvis was performed following the standard protocol during bolus administration of intravenous contrast. RADIATION DOSE REDUCTION: This exam was performed according to the departmental dose-optimization program which includes automated exposure control, adjustment of the mA and/or kV according to patient size and/or use of iterative reconstruction technique. CONTRAST:  OMNIPAQUE IOHEXOL 300 MG/ML  SOLN COMPARISON:  Report from same day transesophageal echocardiogram. FINDINGS: CT CHEST FINDINGS Cardiovascular: No acute vascular findings. No evidence of acute pulmonary embolism. There is mild atherosclerosis of the aorta, great vessels and coronary arteries. No obvious abnormality aortic valve on this non gated study. The heart size is normal. There is no pericardial effusion. Mediastinum/Nodes: There are no enlarged mediastinal, hilar or axillary lymph nodes. Moderate size paraesophageal hiatal hernia. The thyroid  gland and trachea appear  unremarkable. Lungs/Pleura: No pleural effusion or pneumothorax. There is a small calcified left lower lobe granuloma. The lungs otherwise clear. Musculoskeletal/Chest wall: No chest wall mass or suspicious osseous findings. Mild thoracolumbar scoliosis and spondylosis. CT ABDOMEN AND PELVIS FINDINGS Hepatobiliary: The liver is normal in density without suspicious focal abnormality. No significant biliary dilatation status post cholecystectomy. Pancreas: Unremarkable. No pancreatic ductal dilatation or surrounding inflammatory changes. Spleen: Normal in size without focal abnormality. Adrenals/Urinary Tract: Both adrenal glands appear normal. No evidence of urinary tract calculus, suspicious renal lesion or hydronephrosis. 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 There are small renal cysts bilaterally for which no specific follow-up imaging is recommended. The bladder appears  unremarkable for its degree of distention. Stomach/Bowel: Enteric contrast has passed into the mid colon. As above, moderate-sized paraesophageal hiatal hernia. The stomach otherwise appears unremarkable for its degree of distention. No evidence of bowel distension, wall thickening or surrounding inflammation. The appendix appears normal. Mildly prominent stool throughout the colon. Vascular/Lymphatic: There are no enlarged abdominal or pelvic lymph nodes. Aortic and branch vessel atherosclerosis without evidence of aneurysm or large vessel occlusion. Reproductive: The uterus and ovaries appear unremarkable. No evidence of adnexal mass. Other: No evidence of abdominal wall mass or hernia. No ascites or pneumoperitoneum. Musculoskeletal: No acute or significant osseous findings. Mild spondylosis. IMPRESSION: 1. No acute findings or explanation for the patient's symptoms. 2. No evidence of aortic aneurysm or large vessel occlusion. No signs of embolic disease. 3. Moderate-sized paraesophageal hiatal hernia. 4.  Aortic Atherosclerosis (ICD10-I70.0). Electronically Signed   By: Elsie Perone M.D.   On: 12/13/2023 16:09   ECHO TEE Result Date: 12/13/2023    TRANSESOPHOGEAL ECHO REPORT   Patient Name:   EMMALINA ESPERICUETA Date of Exam: 12/12/2023 Medical Rec #:  [id number]       Height:       65.0 in Accession #:    [id number]      Weight:       143.1 lb Date of Birth:  [birth year]      BSA:          1.716 m Patient Age:    87 years        BP:           142/83 mmHg Patient Gender: F               HR:           83 bpm. Exam Location:  Inpatient Procedure: Transesophageal Echo, Cardiac Doppler and Color Doppler (Both            Spectral and Color Flow Doppler were utilized during procedure). Indications:     Endocarditis  History:         Patient has prior history of Echocardiogram examinations, most                  recent 12/11/2023. CKD, stage 2; Risk Factors:Hypertension and                  Dyslipidemia.   Sonographer:     Thea Norlander RCS Referring Phys:  [id number] [facility] Diagnosing Phys: Annabella Scarce MD PROCEDURE: After discussion of the risks and benefits of a TEE, an informed consent was obtained from the patient. The transesophogeal probe was passed without difficulty through the esophogus of the patient. Imaged were obtained with the patient in a left lateral decubitus position. Sedation performed by different physician. The patient was monitored while under deep sedation.  Anesthestetic sedation was provided intravenously by Anesthesiology: 210.18mg  of Propofol . The patient's vital signs; including heart rate, blood pressure, and oxygen saturation; remained stable throughout the procedure. The patient developed no complications during the procedure.  IMPRESSIONS  1. Image quality limited by large hiatal hernia and inability to pass the probe beyond 30 cm.  2. Left ventricular ejection fraction, by estimation, is 60 to 65%. The left ventricle has normal function. The left ventricle has no regional wall motion abnormalities.  3. Right ventricular systolic function was not well visualized. The right ventricular size is not well visualized.  4. No left atrial/left atrial appendage thrombus was detected.  5. The mitral valve is normal in structure. Trivial mitral valve regurgitation. No evidence of mitral stenosis.  6. 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 Measures 1.18 x 0.37 cm on the left coronary cusp. Imaging is limited by poor acoustic windows and inability to advance the probe beyond 30 cm due to a large hiatal hernia. The aortic valve is abnormal. Aortic valve regurgitation is trivial. No aortic stenosis is present.  7. The inferior vena cava is normal in size with greater than 50% respiratory variability, suggesting right atrial pressure of 3 mmHg. Conclusion(s)/Recommendation(s): Normal biventricular function without  evidence of hemodynamically significant valvular heart disease. FINDINGS  Left Ventricle: Left ventricular ejection fraction, by estimation, is 60 to 65%. The left ventricle has normal function. The left ventricle has no regional wall motion abnormalities. The left ventricular internal cavity size was normal in size. There is  no left ventricular hypertrophy. Right Ventricle: The right ventricular size is not well visualized. Right vetricular wall thickness was not well visualized. Right ventricular systolic function was not well visualized. Left Atrium: Left atrial size was normal in size. No left atrial/left atrial appendage thrombus was detected. Right Atrium: Right atrial size was not well visualized. Pericardium: There is no evidence of pericardial effusion. Mitral Valve: The mitral valve is normal in structure. Trivial mitral valve regurgitation. No evidence of mitral valve stenosis. Tricuspid Valve: The tricuspid valve is not assessed. Tricuspid valve regurgitation is trivial. No evidence of tricuspid stenosis. 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 Measures 1.18 x 0.37 cm on the left coronary cusp. Imaging is limited by poor acoustic windows and inability to advance the probe beyond 30 cm due to a large hiatal hernia. The aortic valve is abnormal. Aortic valve regurgitation is trivial. No aortic stenosis is present. Pulmonic Valve: The pulmonic valve was normal in structure. Pulmonic valve regurgitation is not visualized. No evidence of pulmonic stenosis. Aorta: The aortic root and ascending aorta are structurally normal, with no evidence of dilitation. Venous: The inferior vena cava is normal in size with greater than 50% respiratory variability, suggesting right atrial pressure of 3 mmHg. IAS/Shunts: The interatrial septum was not well visualized. Additional Comments: Spectral Doppler performed. Annabella Scarce MD  Electronically signed by Annabella Scarce MD Signature Date/Time: 12/13/2023/12:28:05 PM    Final    EP STUDY Result Date: 12/12/2023 See surgical note for result.  MR BRAIN WO CONTRAST Result Date: 12/11/2023 EXAM: MRI BRAIN WITHOUT CONTRAST 12/11/2023 03:23:53 PM TECHNIQUE: Multiplanar multisequence MRI of the head/brain was performed without the administration of intravenous contrast. COMPARISON: None available. CLINICAL HISTORY: Neuro deficit, acute, stroke suspected. Dizziness. FINDINGS: BRAIN AND VENTRICLES: No acute infarct. No intracranial hemorrhage. No mass. No midline shift. No hydrocephalus. Mild atrophy and  white matter changes are within normal limits for age. The sella is unremarkable. Normal flow voids. ORBITS: Bilateral lens replacements are noted. The globes and orbits are otherwise within normal limits. SINUSES AND MASTOIDS: No acute abnormality. BONES AND SOFT TISSUES: Normal marrow signal. No acute soft tissue abnormality. IMPRESSION: 1. No acute intracranial abnormality to explain neuro deficit, acute, stroke suspected, and dizziness. Electronically signed by: Lonni Necessary MD 12/11/2023 05:40 PM EST RP Workstation: [id number]   ECHOCARDIOGRAM COMPLETE Result Date: 12/11/2023    ECHOCARDIOGRAM REPORT   Patient Name:   ALESHA JAFFEE Date of Exam: 12/11/2023 Medical Rec #:  [id number]       Height:       65.0 in Accession #:    [id number]      Weight:       143.1 lb Date of Birth:  03-05-1935      BSA:          1.716 m Patient Age:    87 years        BP:           157/94 mmHg Patient Gender: F               HR:           82 bpm. Exam Location:  Inpatient Procedure: 2D Echo (Both Spectral and Color Flow Doppler were utilized during            procedure). Indications:    Syncope  History:        Patient has no prior history of Echocardiogram examinations.  Sonographer:    Charmaine Gaskins Referring Phys: MILBERT SABAS RAMAN LAMA IMPRESSIONS  1. Left ventricular ejection fraction, by  estimation, is 65 to 70%. The left ventricle has normal function. The left ventricle has no regional wall motion abnormalities. Left ventricular diastolic parameters are consistent with Grade I diastolic dysfunction (impaired relaxation).  2. Right ventricular systolic function is normal. The right ventricular size is normal.  3. The mitral valve is normal in structure. Mild mitral valve regurgitation. No evidence of mitral stenosis.  4. The aortic valve is tricuspid. There is no aortic regurgitation or stenosis. There is a small echogenic independently mobile density on the left coronary cusp of the aortic valve measuring 0.4 cm x 0.4 cm. The differential includes small papillary fibroelastoma vs lambl excrescence vs calcification. Less likely endocarditis in the absence of symptoms.  5. The inferior vena cava is normal in size with greater than 50% respiratory variability, suggesting right atrial pressure of 3 mmHg. Comparison(s): No prior Echocardiogram. Conclusion(s)/Recommendation(s): Normal biventricular systolic function. There is a mobile echodensity on the aortic valve. Unclear if this is related to syncope or not. Recommend performing a transesophageal echocardiogram for further evaluation. FINDINGS  Left Ventricle: Left ventricular ejection fraction, by estimation, is 65 to 70%. The left ventricle has normal function. The left ventricle has no regional wall motion abnormalities. The left ventricular internal cavity size was normal in size. There is  no left ventricular hypertrophy. Left ventricular diastolic parameters are consistent with Grade I diastolic dysfunction (impaired relaxation). Right Ventricle: The right ventricular size is normal. No increase in right ventricular wall thickness. Right ventricular systolic function is normal. Left Atrium: Left atrial size was normal in size. Right Atrium: Right atrial size was normal in size. Pericardium: There is no evidence of pericardial effusion. Mitral  Valve: The mitral valve is normal in structure. Mild mitral valve regurgitation. No evidence of mitral valve stenosis. Tricuspid Valve:  The tricuspid valve is normal in structure. Tricuspid valve regurgitation is mild . No evidence of tricuspid stenosis. Aortic Valve: The aortic valve is tricuspid. There is no aortic regurgitation or stenosis. There is a small echogenic independently mobile density on the left coronary cusp of the aortic valve measuring 0.4 cm x 0.4 cm. The differential includes small papillary fibroelastoma vs lambl excrescence vs calcification. Less likely endocarditis in the absence of symptoms. Pulmonic Valve: The pulmonic valve was normal in structure. Pulmonic valve regurgitation is not visualized. No evidence of pulmonic stenosis. Aorta: The aortic root and ascending aorta are structurally normal, with no evidence of dilitation. Venous: The inferior vena cava is normal in size with greater than 50% respiratory variability, suggesting right atrial pressure of 3 mmHg. IAS/Shunts: No atrial level shunt detected by color flow Doppler.  LEFT VENTRICLE PLAX 2D LVIDd:         3.70 cm   Diastology LVIDs:         2.20 cm   LV e' medial:    5.11 cm/s LV PW:         0.70 cm   LV E/e' medial:  17.5 LV IVS:        0.90 cm   LV e' lateral:   9.68 cm/s LVOT diam:     2.00 cm   LV E/e' lateral: 9.2 LVOT Area:     3.14 cm  RIGHT VENTRICLE RV Basal diam:  2.10 cm RV Mid diam:    2.30 cm RV S prime:     13.10 cm/s LEFT ATRIUM             Index        RIGHT ATRIUM           Index LA diam:        2.50 cm 1.46 cm/m   RA Area:     10.80 cm LA Vol (A2C):   33.1 ml 19.29 ml/m  RA Volume:   25.00 ml  14.57 ml/m LA Vol (A4C):   56.6 ml 32.99 ml/m LA Biplane Vol: 42.9 ml 25.00 ml/m   AORTA Ao Root diam: 2.90 cm Ao Asc diam:  2.60 cm MITRAL VALVE                TRICUSPID VALVE MV Area (PHT): 3.00 cm     TR Peak grad:   38.7 mmHg MV Decel Time: 253 msec     TR Vmax:        311.00 cm/s MV E velocity: 89.20 cm/s MV  A velocity: 110.00 cm/s  SHUNTS MV E/A ratio:  0.81         Systemic Diam: 2.00 cm Georganna Archer Electronically signed by Georganna Archer Signature Date/Time: 12/11/2023/2:48:56 PM    Final    DG Chest Port 1V same Day Result Date: 12/10/2023 EXAM: 1 VIEW(S) XRAY OF THE CHEST 12/10/2023 09:41:00 PM COMPARISON: Comparison made to 03/16/2006. CLINICAL HISTORY: Fall. FINDINGS: LUNGS AND PLEURA: No focal pulmonary opacity. No pleural effusion. No pneumothorax. HEART AND MEDIASTINUM: No acute abnormality of the cardiac silhouette. Large hiatal hernia, new from prior examination. BONES AND SOFT TISSUES: No acute osseous abnormality. IMPRESSION: 1. No acute findings. 2. Large hiatal hernia, new from prior examination. Electronically signed by: Dorethia Molt MD 12/10/2023 09:50 PM EST RP Workstation: [id number]   CT Head Wo Contrast Result Date: 12/10/2023 CLINICAL DATA:  Neurologic deficit EXAM: CT HEAD WITHOUT CONTRAST TECHNIQUE: Contiguous axial images were obtained from the base of the skull  through the vertex without intravenous contrast. RADIATION DOSE REDUCTION: This exam was performed according to the departmental dose-optimization program which includes automated exposure control, adjustment of the mA and/or kV according to patient size and/or use of iterative reconstruction technique. COMPARISON:  None Available. FINDINGS: Brain: No acute infarct or hemorrhage. Lateral ventricles and midline structures are unremarkable. No acute extra-axial fluid collections. No mass effect. Vascular: No hyperdense vessel or unexpected calcification. Skull: Normal. Negative for fracture or focal lesion. Sinuses/Orbits: No acute finding. Other: None. IMPRESSION: 1. No acute intracranial process. Electronically Signed   By: Ozell Daring M.D.   On: 12/10/2023 19:13   MM 3D DIAGNOSTIC MAMMOGRAM UNILATERAL LEFT BREAST Result Date: 11/24/2023 CLINICAL DATA:  Recall from screening for a possible left breast mass. EXAM:  DIGITAL DIAGNOSTIC UNILATERAL LEFT MAMMOGRAM WITH TOMOSYNTHESIS AND CAD; ULTRASOUND LEFT BREAST LIMITED TECHNIQUE: Left digital diagnostic mammography and breast tomosynthesis was performed. The images were evaluated with computer-aided detection. ; Targeted ultrasound examination of the left breast was performed. COMPARISON:  Previous exam(s). ACR Breast Density Category b: There are scattered areas of fibroglandular density. FINDINGS: On spot compression imaging, the possible mass noted in the medial left breast persists as a small, mostly circumscribed, oval mass measuring 5 mm in long axis, projecting at middle depth. Targeted left breast ultrasound is performed, showing an oval cyst at 9 o'clock, 3 cm the nipple, measuring 6 x 4 x 6 mm, consistent in size, shape and location to the mammographic mass. No solid mass or suspicious finding. IMPRESSION: 1. No evidence of breast malignancy. 2. Small benign cyst in the medial left breast accounts for the screening mammographic mass. RECOMMENDATION: Screening mammogram in one year.(Code:SM-B-01Y) I have discussed the findings and recommendations with the patient. If applicable, a reminder letter will be sent to the patient regarding the next appointment. BI-RADS CATEGORY  2: Benign. Electronically Signed   By: Alm Parkins M.D.   On: 11/24/2023 11:51   US  LIMITED ULTRASOUND INCLUDING AXILLA LEFT BREAST  Result Date: 11/24/2023 CLINICAL DATA:  Recall from screening for a possible left breast mass. EXAM: DIGITAL DIAGNOSTIC UNILATERAL LEFT MAMMOGRAM WITH TOMOSYNTHESIS AND CAD; ULTRASOUND LEFT BREAST LIMITED TECHNIQUE: Left digital diagnostic mammography and breast tomosynthesis was performed. The images were evaluated with computer-aided detection. ; Targeted ultrasound examination of the left breast was performed. COMPARISON:  Previous exam(s). ACR Breast Density Category b: There are scattered areas of fibroglandular density. FINDINGS: On spot compression imaging,  the possible mass noted in the medial left breast persists as a small, mostly circumscribed, oval mass measuring 5 mm in long axis, projecting at middle depth. Targeted left breast ultrasound is performed, showing an oval cyst at 9 o'clock, 3 cm the nipple, measuring 6 x 4 x 6 mm, consistent in size, shape and location to the mammographic mass. No solid mass or suspicious finding. IMPRESSION: 1. No evidence of breast malignancy. 2. Small benign cyst in the medial left breast accounts for the screening mammographic mass. RECOMMENDATION: Screening mammogram in one year.(Code:SM-B-01Y) I have discussed the findings and recommendations with the patient. If applicable, a reminder letter will be sent to the patient regarding the next appointment. BI-RADS CATEGORY  2: Benign. Electronically Signed   By: Alm Parkins M.D.   On: 11/24/2023 11:51    Microbiology: Results for orders placed or performed during the hospital encounter of 12/10/23  Culture, blood (single) w Reflex to ID Panel     Status: None (Preliminary result)   Collection Time: 12/12/23  5:25  PM   Specimen: BLOOD  Result Value Ref Range Status   Specimen Description   Final    BLOOD BLOOD RIGHT HAND Performed at [facility], [address] [place], [place] [address]    Special Requests   Final    BOTTLES DRAWN AEROBIC ONLY Blood Culture adequate volume Performed at [facility], [address], [place], [place] [address]    Culture   Final    NO GROWTH < 12 HOURS Performed at [facility], [address] [place], [place] [address]    Report Status PENDING  Incomplete  Culture, blood (Routine X 2) w Reflex to ID Panel     Status: None (Preliminary result)   Collection Time: 12/12/23  5:39 PM   Specimen: BLOOD  Result Value Ref Range Status   Specimen Description   Final    BLOOD BLOOD LEFT HAND Performed at [facility], [address] [place], [place] [address]     Special Requests   Final    BOTTLES DRAWN AEROBIC ONLY Blood Culture adequate volume Performed at [facility], [address] [place], [place] [address]    Culture   Final    NO GROWTH < 12 HOURS Performed at [facility], [address] [place], [place] [address]    Report Status PENDING  Incomplete  Culture, blood (Routine X 2) w Reflex to ID Panel     Status: None (Preliminary result)   Collection Time: 12/12/23  5:39 PM   Specimen: BLOOD  Result Value Ref Range Status   Specimen Description   Final    BLOOD BLOOD LEFT ARM Performed at [facility], [address] [place], [place] [address]    Special Requests   Final    BOTTLES DRAWN AEROBIC ONLY Blood Culture adequate volume Performed at [facility], [address] [place], [place] [address]    Culture   Final    NO GROWTH < 12 HOURS Performed at [facility], [address] [place], [place] [address]    Report Status PENDING  Incomplete    Labs: CBC: Recent Labs  Lab 12/10/23 1611 12/10/23 1620 12/11/23 0644 12/12/23 0632  WBC 5.8  --  6.1 5.3  HGB 11.9* 11.9* 11.9* 12.6  HCT 37.1 35.0* 36.7 40.0  MCV 81.7  --  80.8 82.1  PLT 275  --  299 297   Basic Metabolic Panel: Recent Labs  Lab 12/10/23 1611 12/10/23 1620 12/11/23 1001 12/12/23 0632  NA 137 137 141 142  K 3.4* 3.4* 3.5 3.6  CL 103 101 104 103  CO2 25  --  29 26  GLUCOSE 355* 349* 249* 209*  BUN 9 9 8 12   CREATININE 0.60 0.60 0.77 0.56  CALCIUM  8.6*  --  9.5 9.3   Liver Function Tests: Recent Labs  Lab 12/10/23 1611 12/11/23 1001 12/12/23 0632  AST 17 15 16   ALT 15 13 13   ALKPHOS 107 92 101  BILITOT 0.3 0.3 0.3  PROT 6.2* 6.0* 6.7  ALBUMIN 3.8 3.6 3.9   CBG: Recent Labs  Lab 12/12/23 1720 12/12/23 2141 12/13/23 0756 12/13/23 1231 12/13/23 1700  GLUCAP 317* 217* 199* 198* 189*    Discharge time spent: approximately 45 minutes spent on discharge  counseling, evaluation of patient on day of discharge, and coordination of discharge planning with nursing, social work, pharmacy and case management  Signed: Lonni SQUIBB  Jonel, MD [facility] 12/13/2023

## 2023-12-13 NOTE — Inpatient Diabetes Management (Signed)
 Inpatient Diabetes Program Recommendations  AACE/ADA: New Consensus Statement on Inpatient Glycemic Control (2015)  Target Ranges:  Prepandial:   less than 140 mg/dL      Peak postprandial:   less than 180 mg/dL (1-2 hours)      Critically ill patients:  140 - 180 mg/dL   Lab Results  Component Value Date   GLUCAP 199 (H) 12/13/2023   HGBA1C 8.7 (H) 08/10/2023    Review of Glycemic Control  Latest Reference Range & Units 12/12/23 07:38 12/12/23 12:15 12/12/23 17:20 12/12/23 21:41 12/13/23 07:56  Glucose-Capillary 70 - 99 mg/dL 790 (H) 862 (H) 682 (H) 217 (H) 199 (H)  (H): Data is abnormally high  Diabetes history: DM2 Outpatient Diabetes medications: None listed Current orders for Inpatient glycemic control: Semglee 5 units every day, Novolog 0-15 units TID and 0-5 units QHS   Inpatient Diabetes Program Recommendations:     Please consider increasing basal insulin :   Semglee 10 units at bedtime May need small amount of meal coverage TID   Thank you, Wyvonna Pinal, MSN, CDCES Diabetes Coordinator Inpatient Diabetes Program [phone] (team pager from 8a-5p)

## 2023-12-13 NOTE — Anesthesia Postprocedure Evaluation (Signed)
 Anesthesia Post Note  Patient: Stephanie Roach  Procedure(s) Performed: TRANSESOPHAGEAL ECHOCARDIOGRAM     Patient location during evaluation: Cath Lab Anesthesia Type: MAC Level of consciousness: awake and alert Pain management: pain level controlled Vital Signs Assessment: post-procedure vital signs reviewed and stable Respiratory status: spontaneous breathing, nonlabored ventilation and respiratory function stable Cardiovascular status: stable and blood pressure returned to baseline Postop Assessment: no apparent nausea or vomiting Anesthetic complications: no   No notable events documented.                Chibuikem Thang

## 2023-12-13 NOTE — Progress Notes (Signed)
 Brief Note - CT w/o evidence of embolism. Given size < 1.0 cm, no indication for surgery - Will repeat TTE in 3 months to assess any change in size - Follow up in cardiology clinic  Stephanie Roach H. Ren Ny, MD [facility]

## 2023-12-13 NOTE — TOC Initial Note (Signed)
 Transition of Care [facility]) - Initial/Assessment Note    Patient Details  Name: Stephanie Roach MRN: [id number] Date of Birth: November 18, 1935  Transition of [facility]) CM/SW Contact:    Heather DELENA Saltness, LCSW Phone Number: 12/13/2023, 2:31 PM  Clinical Narrative:                 Pt admitted to the hospital due to hyperglycemia. Pt is from home with daughter and grandson. PT evaluated pt, no follow up recommended. TOC will continue to follow for needs.   Expected Discharge Plan: Home/Self Care Barriers to Discharge: Continued Medical Work up   Patient Goals and CMS Choice Patient states their goals for this hospitalization and ongoing recovery are:: To return home        Expected Discharge Plan and Services In-house Referral: Clinical Social Work Discharge Planning Services: NA Post Acute Care Choice: NA Living arrangements for the past 2 months: Single Family Home                 DME Arranged: N/A DME Agency: NA       HH Arranged: NA HH Agency: NA        Prior Living Arrangements/Services Living arrangements for the past 2 months: Single Family Home Lives with:: Adult Children, Relatives Patient language and need for interpreter reviewed:: Yes Do you feel safe going back to the place where you live?: Yes      Need for Family Participation in Patient Care: Yes (Comment) Care giver support system in place?: Yes (comment)   Criminal Activity/Legal Involvement Pertinent to Current Situation/Hospitalization: No - Comment as needed  Activities of Daily Living   ADL Screening (condition at time of admission) Independently performs ADLs?: Yes (appropriate for developmental age) Is the patient deaf or have difficulty hearing?: No Does the patient have difficulty seeing, even when wearing glasses/contacts?: No Does the patient have difficulty concentrating, remembering, or making decisions?: No  Permission Sought/Granted Permission sought to share information with : Family  Supports Permission granted to share information with : Yes, Verbal Permission Granted  Share Information with NAME: Alix and Shalene Gallen     Permission granted to share info w Relationship: Daughter and grandson  Permission granted to share info w Contact Information: [phone] and [phone]  Emotional Assessment Appearance:: Appears stated age Attitude/Demeanor/Rapport: Engaged Affect (typically observed): Stable, Pleasant, Accepting, Appropriate, Adaptable Orientation: : Oriented to Self, Oriented to Place, Oriented to  Time, Oriented to Situation Alcohol / Substance Use: Not Applicable Psych Involvement: No (comment)  Admission diagnosis:  Dizziness [R42] Near syncope [R55] 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] Patient Active Problem List   Diagnosis Date Noted   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   Breast cyst, left 11/29/2023   CKD stage 2 due to type 2 diabetes mellitus (HCC) 02/16/2022   Grief reaction 04/02/2020   S/P lumpectomy, right breast 09/17/2019   Medication management 02/18/2015   Hiatal hernia 02/18/2015   Abnormal glucose    Hyperlipidemia associated with type 2 diabetes mellitus (HCC)    Labile hypertension    Vitamin D  deficiency    Hx of adenomatous colonic polyps 08/11/2010   Iron deficiency anemia 08/11/2010   Chronic anxiety 08/11/2010   PCP:  Billy Knee, FNP Pharmacy:   [facility] [phone] [facility] - [place] - [address] [place] [address] Phone: [phone] Fax: [phone]  [id number] - [place], [place] - [address] AT [facility] [address]  [address] [place] [address] Phone: [phone] Fax: [phone]   Social Drivers of Health (SDOH) Social History: SDOH Screenings   Food Insecurity: No Food Insecurity (12/10/2023)  Housing: Low Risk  (12/10/2023)  Transportation Needs: No Transportation Needs (12/10/2023)  Utilities: Not At Risk (12/10/2023)   Alcohol Screen: Low Risk  (10/03/2023)  Depression (PHQ2-9): Low Risk  (10/03/2023)  Recent Concern: Depression (PHQ2-9) - Medium Risk (08/10/2023)  Financial Resource Strain: Low Risk  (10/03/2023)  Physical Activity: Insufficiently Active (10/03/2023)  Social Connections: Moderately Integrated (12/10/2023)  Recent Concern: Social Connections - Moderately Isolated (10/03/2023)  Stress: Stress Concern Present (10/03/2023)  Tobacco Use: Low Risk  (12/12/2023)  Health Literacy: Adequate Health Literacy (10/03/2023)   SDOH Interventions: None     Readmission Risk Interventions    12/13/2023    2:28 PM  Readmission Risk Prevention Plan  Post Dischage Appt Complete  Medication Screening Complete  Transportation Screening Complete    Signed: Heather Saltness, MSW, LCSW Clinical Social Worker Inpatient Care Management 12/13/2023 2:33 PM

## 2023-12-13 NOTE — Progress Notes (Signed)
 Physical Therapy Treatment Patient Details Name: Stephanie Roach MRN: [id number] DOB: 25-Apr-1935 Today's Date: 12/13/2023   History of Present Illness 88 yo pt admitted on 12/10/23 with complaints of sudden onset of dizziness, sweatiness, pt sat down and then lost consciousness briefly, followed by vomiting.  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  MRI was negative for infarct.  Pt does c/o fullness,  fluid in her left ear and decreased hearing on the Left side as well. PMH: DM, GERD    PT Comments  Pt reports feeling better and has not had any further vertigo.  She was able to ambulate 300' without AD.  She had no overt LOB but did drift R/L minimally. Today was negative for BPPV.  Does have some signs of vestibular hypofunction - clogged L ear, gaze induced nystagmus to L, + head thrust to L.  Pt reports has seen ENT for L ear in past when clogged - encouraged her to follow up.  Also, encouraged gaze stabilization exercises. Pt also with other possible medical contributers to her vertigo and syncopal event with the aortic fibroelastoma. Will continue to follow for acute therapy but with symptoms resolved may not need to f/u with outpt PT at this time.    If plan is discharge home, recommend the following: A little help with walking and/or transfers;A little help with bathing/dressing/bathroom;Assist for transportation   Can travel by private vehicle        Equipment Recommendations  None recommended by PT    Recommendations for Other Services       Precautions / Restrictions Precautions Precautions: Fall     Mobility  Bed Mobility Overal bed mobility: Modified Independent                  Transfers Overall transfer level: Modified independent                 General transfer comment: Cues to take her time and for focus points if dizzy    Ambulation/Gait Ambulation/Gait assistance: Contact guard assist, Supervision Gait Distance  (Feet): 300 Feet Assistive device: None Gait Pattern/deviations: Step-through pattern, Drifts right/left Gait velocity: decreased but functional     General Gait Details: Did occasionally drift R/L but no overt LOB; denied any dizziness even with head turns; Performed with CGA progressing to close supervision   Stairs             Wheelchair Mobility     Tilt Bed    Modified Rankin (Stroke Patients Only)       Balance Overall balance assessment: Needs assistance Sitting-balance support: Feet supported Sitting balance-Leahy Scale: Normal     Standing balance support: No upper extremity supported Standing balance-Leahy Scale: Good                              Communication    Cognition Arousal: Alert Behavior During Therapy: WFL for tasks assessed/performed   PT - Cognitive impairments: Memory                       PT - Cognition Comments: Did have some decreased recall of events that have happened in hospital (does not remember therapist testing for BPPV when described and does not recall walking and getting dizzy with head turns during prior therapy session) Following commands: Intact      Cueing    Exercises      General  Comments   Vestibular:  Hx: Pt reports episode at home where she felt room spinning and she was going every direction and bumping into wall and cabinets.  Daughter assisted her to sitting.  Report at admission was that she also lost consciousness and vomited.  Last PT session pt with + hall pike dix on L and treated with modified Epley.  She also reports ongoing clogged L ear and has seen ENT in past.  Pt's MRI negative for infarct but she does have aortic fibroelastoma - potential for increased embolic event -currently being ruled out pending CT results, cardiology involved.  Pt denies ongoing vertigo at this time.  She just reported the one event today, but at last PT session did get dizzy with head turns L.     Spontaneous Nystagmus: Negative Gaze Induced Nystagmus: Left beating looking L Smooth Pursuit: Intact Gaze Stabilization Intact Head Thrust: + correction with L head turn  Head Shake: negative  Hall Pike Dix: Negative Bil today Horizontal Roll: negative Bil today  Had pt perform gaze stabilization in sitting 10 sec - tolerated well.  Did educate pt on follow up if severe vertigo or syncope returns.  Discussed vertigo/loss of balance can be sign of TIA/CVA and would need emergent screening.  Also educated on other signs of CVA and need to f/u with emergent care should symptoms develop.      Pertinent Vitals/Pain Pain Assessment Pain Assessment: No/denies pain    Home Living                          Prior Function            PT Goals (current goals can now be found in the care plan section) Progress towards PT goals: Progressing toward goals    Frequency    Min 2X/week      PT Plan      Co-evaluation              AM-PAC PT 6 Clicks Mobility   Outcome Measure  Help needed turning from your back to your side while in a flat bed without using bedrails?: None Help needed moving from lying on your back to sitting on the side of a flat bed without using bedrails?: None Help needed moving to and from a bed to a chair (including a wheelchair)?: None Help needed standing up from a chair using your arms (e.g., wheelchair or bedside chair)?: None Help needed to walk in hospital room?: A Little Help needed climbing 3-5 steps with a railing? : A Little 6 Click Score: 22    End of Session Equipment Utilized During Treatment: Gait belt Activity Tolerance: Patient tolerated treatment well Patient left: with call bell/phone within reach;in bed;with bed alarm set Nurse Communication: Mobility status PT Visit Diagnosis: Other abnormalities of gait and mobility (R26.89);Unsteadiness on feet (R26.81);Dizziness and giddiness (R42)     Time: 8784-8754 PT Time  Calculation (min) (ACUTE ONLY): 30 min  Charges:    $Gait Training: 8-22 mins $Physical Performance Test: 8-22 mins PT General Charges $$ ACUTE PT VISIT: 1 Visit                     Benjiman, PT Acute Rehab Services [facility] [phone]    Benjiman VEAR Mulberry 12/13/2023, 12:57 PM

## 2023-12-14 ENCOUNTER — Ambulatory Visit: Admitting: Internal Medicine

## 2023-12-14 ENCOUNTER — Telehealth: Payer: Self-pay

## 2023-12-14 NOTE — Transitions of Care (Post Inpatient/ED Visit) (Signed)
   12/14/2023  Name: Stephanie Roach MRN: [id number] DOB: [birth year]  Today's TOC FU Call Status: Today's TOC FU Call Status:: Unsuccessful Call (1st Attempt) Unsuccessful Call (1st Attempt) Date: 12/14/23  Attempted to reach the patient regarding the most recent Inpatient/ED visit.  Follow Up Plan: Additional outreach attempts will be made to reach the patient to complete the Transitions of Care (Post Inpatient/ED visit) call.   Arvin Seip RN, BSN, CCM [employer], Population Health Case Manager Phone: [phone]

## 2023-12-15 ENCOUNTER — Telehealth: Payer: Self-pay

## 2023-12-15 NOTE — Transitions of Care (Post Inpatient/ED Visit) (Signed)
   12/15/2023  Name: Stephanie Roach MRN: [id number] DOB: [birth year]  Today's TOC FU Call Status: Today's TOC FU Call Status:: Unsuccessful Call (2nd Attempt) Unsuccessful Call (2nd Attempt) Date: 12/15/23  Attempted to reach the patient regarding the most recent Inpatient/ED visit.  No answer and not able to leave HIPAA approved voicemail message to phone number provided in demographics per DPR.    Follow Up Plan: Additional outreach attempts will be made to reach the patient to complete the Transitions of Care (Post Inpatient/ED visit) call.   Richerd Fish, RN, BSN, CCM [facility], [facility] Management Coordinator Direct Dial: [phone]

## 2023-12-16 ENCOUNTER — Telehealth: Payer: Self-pay

## 2023-12-16 NOTE — Telephone Encounter (Signed)
 Pt understood results, pt has OV 11/25 at 10:20

## 2023-12-16 NOTE — Telephone Encounter (Signed)
 LVM for pt to call office for lab result.

## 2023-12-16 NOTE — Transitions of Care (Post Inpatient/ED Visit) (Signed)
   12/16/2023  Name: Stephanie Roach MRN: [id number] DOB: 07-Oct-1935  Today's TOC FU Call Status: Today's TOC FU Call Status:: Unsuccessful Call (3rd Attempt) Unsuccessful Call (3rd Attempt) Date: 12/16/23  Attempted to reach the patient regarding the most recent Inpatient/ED visit.  Follow Up Plan: No further outreach attempts will be made at this time. We have been unable to contact the patient.  Alan Ee, RN, BSN, CEN [employer]- Transition of Care Team.  [facility] [phone]

## 2023-12-17 LAB — CULTURE, BLOOD (SINGLE)
Culture: NO GROWTH
Special Requests: ADEQUATE

## 2023-12-17 LAB — CULTURE, BLOOD (ROUTINE X 2)
Culture: NO GROWTH
Culture: NO GROWTH
Special Requests: ADEQUATE
Special Requests: ADEQUATE

## 2023-12-20 ENCOUNTER — Encounter: Payer: Self-pay | Admitting: Internal Medicine

## 2023-12-20 ENCOUNTER — Ambulatory Visit (INDEPENDENT_AMBULATORY_CARE_PROVIDER_SITE_OTHER): Admitting: Internal Medicine

## 2023-12-20 VITALS — BP 132/72 | HR 82 | Temp 97.8°F | Ht 65.0 in | Wt 139.6 lb

## 2023-12-20 DIAGNOSIS — N182 Chronic kidney disease, stage 2 (mild): Secondary | ICD-10-CM | POA: Diagnosis not present

## 2023-12-20 DIAGNOSIS — I358 Other nonrheumatic aortic valve disorders: Secondary | ICD-10-CM

## 2023-12-20 DIAGNOSIS — Z7984 Long term (current) use of oral hypoglycemic drugs: Secondary | ICD-10-CM

## 2023-12-20 DIAGNOSIS — E1122 Type 2 diabetes mellitus with diabetic chronic kidney disease: Secondary | ICD-10-CM | POA: Diagnosis not present

## 2023-12-20 DIAGNOSIS — F419 Anxiety disorder, unspecified: Secondary | ICD-10-CM | POA: Diagnosis not present

## 2023-12-20 DIAGNOSIS — H6122 Impacted cerumen, left ear: Secondary | ICD-10-CM | POA: Diagnosis not present

## 2023-12-20 DIAGNOSIS — H9192 Unspecified hearing loss, left ear: Secondary | ICD-10-CM

## 2023-12-20 LAB — POCT GLYCOSYLATED HEMOGLOBIN (HGB A1C): Hemoglobin A1C: 10.5 % — AB (ref 4.0–5.6)

## 2023-12-20 MED ORDER — BLOOD GLUCOSE MONITORING SUPPL DEVI: 0 refills | Status: AC

## 2023-12-20 MED ORDER — GLIPIZIDE 5 MG PO TABS: 5.0000 mg | ORAL_TABLET | Freq: Two times a day (BID) | ORAL | 1 refills | Status: AC

## 2023-12-20 MED ORDER — BLOOD GLUCOSE TEST VI STRP: ORAL_STRIP | 3 refills | Status: AC

## 2023-12-20 MED ORDER — LANCETS MISC: 3 refills | Status: AC

## 2023-12-20 NOTE — Progress Notes (Signed)
 [facility] PRIMARY [facility]ARE [facility] [address] [place] [address] Dept: [phone] Dept Fax: [phone]    Subjective:   Stephanie Roach [birth year] 12/20/2023  Chief Complaint  Patient presents with   Hospitalization Follow-up    No energy, should she finish medication for UTI, needs a way to check Blood sugars at home    HPI:  Discussed the use of AI scribe software for clinical note transcription with the patient, who gave verbal consent to proceed.  History of Present Illness   Stephanie Roach is an 88 year old female who presents for a hospital follow-up visit for vertigo.  She was admitted to the hospital from November 15 to December 13, 2023, due to vertigo characterized by a spinning sensation, malaise, sweating, and syncopal episodes. The vertigo resolved quickly during her hospital stay.  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 Cardiology and cardiothoracic surgery were consulted, and it was recommended to monitor the fibroelastoma as it was less than one centimeter.  She has a history of type 2 diabetes and was switched from metformin  to glipizide  5 mg once daily during her hospital stay. She had not been taking metformin  previously due to her concern of the side effects. Her blood sugar levels have been high, with an A1c of 10.5% today.  She has chronic kidney disease stage 2 secondary to type 2 diabetes, chronic anxiety managed with Effexor  150 mg once daily and Ativan  1 mg twice daily, and hyperlipidemia managed with rosuvastatin  5 mg once daily.  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 Jesus for ear cleaning.  No current vertigo, but she feels tired and lacks energy.      Lab Results  Component Value Date   HGBA1C 10.5 (A) 12/20/2023   HGBA1C 8.7 (H)  08/10/2023   HGBA1C 8.1 (H) 04/13/2023     The following portions of the patient's history were reviewed and updated as appropriate: past medical history, past surgical history, family history, social history, allergies, medications, and problem list.   Patient Active Problem List   Diagnosis Date Noted   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   Breast cyst, left 11/29/2023   Type 2 diabetes mellitus with stage 2 chronic kidney disease, without long-term current use of insulin  (HCC) 02/16/2022   Grief reaction 04/02/2020   S/P lumpectomy, right breast 09/17/2019   Medication management 02/18/2015   Hiatal hernia 02/18/2015   Abnormal glucose    Hyperlipidemia associated with type 2 diabetes mellitus (HCC)    Labile hypertension    Vitamin D  deficiency    Hx of adenomatous colonic polyps 08/11/2010   Iron deficiency anemia 08/11/2010   Chronic anxiety 08/11/2010   Past Medical History:  Diagnosis Date   Allergic rhinitis    Anemia    Anxiety    Arthritis    Depression    GERD (gastroesophageal reflux disease)    Hyperlipidemia    Hypertension    Migraine    Prediabetes    Sleep apnea    no cpap   Vitamin D  deficiency    Past Surgical History:  Procedure Laterality Date   BREAST BIOPSY Right 10/19/2018   FIBROCYSTIC CHANGE WITH USUAL DUCTAL HYPERPLASIA,   BREAST EXCISIONAL BIOPSY Right 07/05/2019   Negative for in situ or invasive carcinoma   BREAST LUMPECTOMY WITH RADIOACTIVE SEED LOCALIZATION Right  07/05/2019   Procedure: RIGHT BREAST LUMPECTOMY WITH RADIOACTIVE SEED LOCALIZATION;  Surgeon: Vernetta Berg, MD;  Location: [facility] OR;  Service: General;  Laterality: Right;   BUNIONECTOMY     CATARACT EXTRACTION Bilateral    CHOLECYSTECTOMY  2003   KNEE ARTHROSCOPY     TRANSESOPHAGEAL ECHOCARDIOGRAM (CATH LAB) N/A 12/12/2023   Procedure: TRANSESOPHAGEAL ECHOCARDIOGRAM;  Surgeon: Raford Riggs, MD;  Location: [facility] INVASIVE CV LAB;  Service: Cardiovascular;  Laterality: N/A;    WRIST FRACTURE SURGERY     Family History  Problem Relation Age of Onset   Colon cancer Father    Stroke Mother    Breast cancer Cousin    Pancreatic cancer Paternal Aunt    Ulcerative colitis Brother    Colon polyps Maternal Aunt     Current Outpatient Medications:    Blood Glucose Monitoring Suppl DEVI, Check blood sugar morning and evening. May substitute to any manufacturer covered by patient's insurance., Disp: 1 each, Rfl: 0   Glucose Blood (BLOOD GLUCOSE TEST STRIPS) STRP, Check blood sugar morning and evening. May substitute to any manufacturer covered by patient's insurance., Disp: 200 strip, Rfl: 3   Lancets MISC, Check blood sugar morning and evening. Dispense based on patient and insurance preference. Use up to four times daily as directed. (FOR ICD-10 E10.9, E11.9)., Disp: 200 each, Rfl: 3   Blood Glucose Monitoring Suppl (ACCU-CHEK GUIDE) w/Device KIT, USE TO CHECK BLOOD SUGAR ONCE DAILY OR AS DIRECTED. (Patient not taking: Reported on 04/13/2023), Disp: 1 kit, Rfl: 0   Cholecalciferol (VITAMIN D3) 125 MCG (5000 UT) CAPS, Take by mouth., Disp: , Rfl:    glipiZIDE  (GLUCOTROL ) 5 MG tablet, Take 1 tablet (5 mg total) by mouth 2 (two) times daily with a meal., Disp: 180 tablet, Rfl: 1   LORazepam  (ATIVAN ) 1 MG tablet, Take 1 mg by mouth 2 (two) times daily., Disp: , Rfl:    omeprazole  (PRILOSEC) 20 MG capsule, Take by mouth., Disp: , Rfl:    rosuvastatin  (CRESTOR ) 5 MG tablet, Take 1 tablet (5 mg total) by mouth daily., Disp: 90 tablet, Rfl: 3   traZODone  (DESYREL ) 50 MG tablet, Take  1 to 2 tablets  1 hour  before Bedtime as needed for Sleep, Disp: 60 tablet, Rfl: 0   venlafaxine  XR (EFFEXOR -XR) 150 MG 24 hr capsule, Take 150 mg by mouth at bedtime., Disp: , Rfl:  Allergies  Allergen Reactions   Morphine And Codeine     Unknown      ROS: A complete ROS was performed with pertinent positives/negatives noted in the HPI. The remainder of the ROS are negative.    Objective:    Today's Vitals   12/20/23 1036  BP: 132/72  Pulse: 82  Temp: 97.8 F (36.6 C)  TempSrc: Temporal  SpO2: 99%  Weight: 139 lb 9.6 oz (63.3 kg)  Height: 5' 5 (1.651 m)    GENERAL: Well-appearing, in NAD. Well nourished.  SKIN: Pink, warm and dry. No rash, lesion, ulceration, or ecchymoses.  HEENT:    HEAD: Normocephalic, non-traumatic.  EYES: Conjunctive pink without exudate. PERRL, EOMI.  EARS: External ear w/o redness, swelling, masses, or lesions. Cerumen impaction to left ear.  NECK: Trachea midline. Full ROM w/o pain or tenderness. No lymphadenopathy.  RESPIRATORY: Chest wall symmetrical. Respirations even and non-labored. Breath sounds clear to auscultation bilaterally.  CARDIAC: S1, S2 present, regular rate and rhythm. Peripheral pulses 2+ bilaterally.  MSK: Muscle tone and strength appropriate for age. Joints w/o tenderness, redness, or swelling.  EXTREMITIES: Without clubbing, cyanosis, or edema.  NEUROLOGIC: No motor or sensory deficits. Steady, even gait.  PSYCH/MENTAL STATUS: Alert, oriented x 3. Cooperative, appropriate mood and affect.   Health Maintenance Due  Topic Date Due   COVID-19 Vaccine (5 - 2025-26 season) 09/26/2023    Results for orders placed or performed in visit on 12/20/23  POCT glycosylated hemoglobin (Hb A1C)  Result Value Ref Range   Hemoglobin A1C 10.5 (A) 4.0 - 5.6 %   HbA1c POC (<> result, manual entry)     HbA1c, POC (prediabetic range)     HbA1c, POC (controlled diabetic range)      The ASCVD Risk score (Arnett DK, et al., 2019) failed to calculate for the following reasons:   The 2019 ASCVD risk score is only valid for ages 60 to 49     Assessment & Plan:  Assessment and Plan    Aortic valve fibroelastoma, under surveillance Aortic fibroelastoma identified on transesophageal echocardiogram, less than 1 cm, not cancerous. Cardiology and cardiothoracic surgery recommended routine surveillance. - Continue routine surveillance with  cardiology. - Follow up with cardiology on December 2nd for further evaluation and potential repeat echocardiogram.  Type 2 diabetes mellitus, poorly controlled with chronic kidney disease stage 2 Poorly controlled type 2 diabetes with recent A1c of 10.5%. Previously on metformin , switched to glipizide  due to CKD. Blood sugars elevated during hospital stay, likely contributing to dizziness and syncope. - Increased glipizide  to twice daily, after breakfast and dinner. - Sent new prescription for glipizide  to pharmacy. - Provided glucometer for home blood sugar monitoring twice daily. - Will follow up in 3 months for reassessment and blood work.  Hyperlipidemia Managed with rosuvastatin  5 mg daily. Cholesterol levels well-managed. - Continue rosuvastatin  5 mg daily.  Chronic anxiety Managed with Effexor  150 mg daily and Ativan  1 mg twice daily. Trazodone  used for sleep-related anxiety. - Continue current anxiety management regimen.  hearing loss, left ear with cerumen impaction - Referred to ENT specialist for evaluation and management of cerumen impaction due to patient preference.        Orders Placed This Encounter  Procedures   Ambulatory referral to ENT    Referral Priority:   Routine    Referral Type:   Consultation    Referral Reason:   Specialty Services Required    Requested Specialty:   Otolaryngology    Number of Visits Requested:   1   POCT glycosylated hemoglobin (Hb A1C)   No images are attached to the encounter or orders placed in the encounter. Meds ordered this encounter  Medications   Blood Glucose Monitoring Suppl DEVI    Sig: Check blood sugar morning and evening. May substitute to any manufacturer covered by patient's insurance.    Dispense:  1 each    Refill:  0    Supervising Provider:   THOMPSON, AARON B [[id number]]   Glucose Blood (BLOOD GLUCOSE TEST STRIPS) STRP    Sig: Check blood sugar morning and evening. May substitute to any manufacturer covered  by patient's insurance.    Dispense:  200 strip    Refill:  3    Supervising Provider:   THOMPSON, AARON B [[id number]]   Lancets MISC    Sig: Check blood sugar morning and evening. Dispense based on patient and insurance preference. Use up to four times daily as directed. (FOR ICD-10 E10.9, E11.9).    Dispense:  200 each    Refill:  3    Supervising Provider:   SEBASTIAN,  AARON B [[id number]]   glipiZIDE  (GLUCOTROL ) 5 MG tablet    Sig: Take 1 tablet (5 mg total) by mouth 2 (two) times daily with a meal.    Dispense:  180 tablet    Refill:  1    Supervising Provider:   SEBASTIAN BEVERLEY NOVAK [[id number]]    Return in about 3 months (around 03/21/2024) for Diabetes, Cholesterol, Hypertension, Anxiety.   Rosina Senters, FNP

## 2023-12-26 NOTE — Progress Notes (Unsigned)
    Cardiology Office Note Date:  12/27/2023  ID:  Stephanie Roach, DOB 03-08-1935, MRN [id number] PCP:  Billy Knee, FNP  Cardiologist:   Joelle VEAR Ren Donley, MD  Chief Complaint  Patient presents with   Hospitalization Follow-up      Problems Syncope 0.4 cm AV PFE on TEE TTE 11/25 65-70%, GIDD,  HTN/HLD M: RN5 LDL 185 7/25, HA1C 10.5 11/25   Visits  12/25: EN10, RN to 20, LP/HA1C/TTE in 3 months    History of Present Illness: Stephanie Roach is a 88 y.o. female who presents for hospital follow up.  She has been doing well since leaving hospital.  She denies any presyncope or fall.  She reports feeling a little bit better not sensation right below her sternum, but denies any chest pain or dyspnea with exertion.    ROS: Please see the history of present illness. All other systems are reviewed and negative.   PHYSICAL EXAM: VS:  BP 110/82 (BP Location: Left Arm, Patient Position: Sitting)   Pulse 83   SpO2 93%  , BMI There is no height or weight on file to calculate BMI. GEN: Well nourished, well developed, in no acute distress HEENT: normal Neck: no JVD, carotid bruits, or masses Cardiac: RRR; no murmurs, rubs, or gallops,no edema  Respiratory:  CTAB bilaterally, normal work of breathing GI: soft, nontender, nondistended, + BS Extremities: No LE edema Skin: warm and dry, no rash Neuro:  Strength and sensation are intact Recent Labs: Reviewed  Studies: Reviewed  ASSESSMENT AND PLAN: Stephanie Roach is a 88 y.o. female who presents for follow up. - Regarding her papillary fibroelastoma, no evidence of embolic phenomenon on CT scan during hospitalization.  Repeat echo in 3 months and again 6 months after that to assess progression. - Regarding her hyperlipidemia, we will increase her rosuvastatin  to 20 mg daily and recheck lipid panel in 3 months. - Regarding her diabetes, we will start Jardiance 10 mg daily and repeat A1c in 3 months.  Patient would prefer to  avoid GLP-1 if possible. - Will follow-up in 3 months.   Signed, Joelle VEAR Ren Donley, MD  12/27/2023 8:17 AM    [facility]

## 2023-12-27 ENCOUNTER — Other Ambulatory Visit: Payer: Self-pay

## 2023-12-27 ENCOUNTER — Ambulatory Visit

## 2023-12-27 VITALS — BP 110/82 | HR 83

## 2023-12-27 DIAGNOSIS — N182 Chronic kidney disease, stage 2 (mild): Secondary | ICD-10-CM

## 2023-12-27 DIAGNOSIS — E1169 Type 2 diabetes mellitus with other specified complication: Secondary | ICD-10-CM | POA: Diagnosis not present

## 2023-12-27 DIAGNOSIS — E1122 Type 2 diabetes mellitus with diabetic chronic kidney disease: Secondary | ICD-10-CM | POA: Diagnosis not present

## 2023-12-27 DIAGNOSIS — D151 Benign neoplasm of heart: Secondary | ICD-10-CM | POA: Diagnosis not present

## 2023-12-27 DIAGNOSIS — I10 Essential (primary) hypertension: Secondary | ICD-10-CM

## 2023-12-27 DIAGNOSIS — E785 Hyperlipidemia, unspecified: Secondary | ICD-10-CM

## 2023-12-27 DIAGNOSIS — I358 Other nonrheumatic aortic valve disorders: Secondary | ICD-10-CM

## 2023-12-27 MED ORDER — ROSUVASTATIN CALCIUM 20 MG PO TABS: 20.0000 mg | ORAL_TABLET | Freq: Every day | ORAL | 3 refills | Status: AC

## 2023-12-27 MED ORDER — EMPAGLIFLOZIN 10 MG PO TABS: 10.0000 mg | ORAL_TABLET | Freq: Every day | ORAL | 3 refills | Status: DC

## 2023-12-27 MED ORDER — TRAZODONE HCL 50 MG PO TABS: ORAL_TABLET | ORAL | 0 refills | Status: DC

## 2023-12-27 NOTE — Patient Instructions (Signed)
 Medication Instructions:  Start Jardiance 10 mg once daily Increase Crestor  to 20 mg once daily *If you need a refill on your cardiac medications before your next appointment, please call your pharmacy*  Lab Work: Fasting lipid panel and A1c in 3 months at [facility] If you have labs (blood work) drawn today and your tests are completely normal, you will receive your results only by: MyChart Message (if you have MyChart) OR A paper copy in the mail If you have any lab test that is abnormal or we need to change your treatment, we will call you to review the results.  Testing/Procedures: Echocardiogram in 3 months Your physician has requested that you have an echocardiogram. Echocardiography is a painless test that uses sound waves to create images of your heart. It provides your doctor with information about the size and shape of your heart and how well your heart's chambers and valves are working. This procedure takes approximately one hour. There are no restrictions for this procedure. Please do NOT wear cologne, perfume, aftershave, or lotions (deodorant is allowed). Please arrive 15 minutes prior to your appointment time.  Please note: We ask at that you not bring children with you during ultrasound (echo/ vascular) testing. Due to room size and safety concerns, children are not allowed in the ultrasound rooms during exams. Our front office staff cannot provide observation of children in our lobby area while testing is being conducted. An adult accompanying a patient to their appointment will only be allowed in the ultrasound room at the discretion of the ultrasound technician under special circumstances. We apologize for any inconvenience.   Follow-Up: At [facility], you and your health needs are our priority.  As part of our continuing mission to provide you with exceptional heart care, our providers are all part of one team.  This team includes your primary Cardiologist  (physician) and Advanced Practice Providers or APPs (Physician Assistants and Nurse Practitioners) who all work together to provide you with the care you need, when you need it.  Your next appointment:   3 month(s)  Provider:   Ren, MD  We recommend signing up for the patient portal called MyChart.  Sign up information is provided on this After Visit Summary.  MyChart is used to connect with patients for Virtual Visits (Telemedicine).  Patients are able to view lab/test results, encounter notes, upcoming appointments, etc.  Non-urgent messages can be sent to your provider as well.   To learn more about what you can do with MyChart, go to [email or web address].

## 2024-01-02 ENCOUNTER — Telehealth: Payer: Self-pay | Admitting: Pharmacy Technician

## 2024-01-02 ENCOUNTER — Other Ambulatory Visit (HOSPITAL_COMMUNITY): Payer: Self-pay

## 2024-01-02 ENCOUNTER — Telehealth: Payer: Self-pay

## 2024-01-02 NOTE — Telephone Encounter (Signed)
 Pt c/o medication issue:  1. Name of Medication: empagliflozin  (JARDIANCE ) 10 MG TABS tablet   2. How are you currently taking this medication (dosage and times per day)?   3. Are you having a reaction (difficulty breathing--STAT)? No  4. What is your medication issue? Patient states she can not afford this medication. Please advise.

## 2024-01-02 NOTE — Telephone Encounter (Signed)
 I called [facility] to ask them to fill glipizide  at the patient request since she is running low

## 2024-01-02 NOTE — Telephone Encounter (Signed)
 Called patient back about Dr. Juluis advisement.   Okay to stop jardiance  if cost is an issue. Can take her glipizide  as prescribed.   Thanks [facility]AT   Patient verbalized understanding, and she will follow-up with her PCP.

## 2024-01-02 NOTE — Telephone Encounter (Signed)
 Jardiance  is 47.00 for 1 month.  Farxiga is 47.00 for 1 month   Patient not eligible for a cardiomyopathy grant.   I called the patient to see if she would like to try for assistance through [employer] for farxiga or [employer] for jardiance  and I see the doctor made a note that she can stop if cost is an issue. I explained someone will call and talk to her about what the doctor said since I'm not clinical. She has my number in case she wants to try for assistance through [employer]

## 2024-01-02 NOTE — Telephone Encounter (Signed)
 Patient  called . She states she can not afford  Jardiance  . It cost over $100 for a 3 month supply.   She also waned to know if  she continue with taking  Glipizide  5 mg  twice a day  with or without  taking the Jardiance .   RN informed patient she  will not need to stop any of  her current medications.   RN informed patient  will notify Dr Ren of her dilemma.  RN also informed patient   will have  patient assistance see if there is an assistance for Jardiance  and prior auth see if Cherryl is cheaper.

## 2024-01-06 ENCOUNTER — Ambulatory Visit: Admitting: Internal Medicine

## 2024-01-23 ENCOUNTER — Ambulatory Visit: Admitting: Emergency Medicine

## 2024-01-23 ENCOUNTER — Ambulatory Visit: Payer: Self-pay

## 2024-01-23 NOTE — Telephone Encounter (Signed)
 FYI Only or Action Required?: Action required by provider: request for appointment and medication refill request.  Patient was last seen in primary care on 12/20/2023 by Billy Knee, FNP.  Called Nurse Triage reporting Cough.  Symptoms began several days ago.  Interventions attempted: Rest, hydration, or home remedies.  Symptoms are: gradually worsening.Cough, fever, sore throat, mild SOB.  Triage Disposition: See HCP Within 4 Hours (Or PCP Triage)  Patient/caregiver understands and will follow disposition?: Yes       Copied from CRM #[id number]. Topic: Clinical - Red Word Triage >> Jan 23, 2024  9:47 AM Jayma L wrote: Red Word that prompted transfer to Nurse Triage:  really sick  , sore throat, fever and extremely fatigue. Runny nose, cough and throat pain really bad - patient hung up after 10 mins of holding , please call her back >> Jan 23, 2024 10:25 AM Dedra B wrote: Pt was previously disconnected while holding for NT. Warm transfer to NT. Reason for Disposition  [1] MILD difficulty breathing (e.g., minimal/no SOB at rest, SOB with walking, pulse < 100) AND [2] still present when not coughing  Answer Assessment - Initial Assessment Questions 1. ONSET: When did the cough begin?      3 days 2. SEVERITY: How bad is the cough today?      severe 3. SPUTUM: Describe the color of your sputum (e.g., none, dry cough; clear, white, yellow, green)     no 4. HEMOPTYSIS: Are you coughing up any blood? If Yes, ask: How much? (e.g., flecks, streaks, tablespoons, etc.)     no 5. DIFFICULTY BREATHING: Are you having difficulty breathing? If Yes, ask: How bad is it? (e.g., mild, moderate, severe)      mild 6. FEVER: Do you have a fever? If Yes, ask: What is your temperature, how was it measured, and when did it start?     sweats 7. CARDIAC HISTORY: Do you have any history of heart disease? (e.g., heart attack, congestive heart failure)      no 8. LUNG HISTORY: Do  you have any history of lung disease?  (e.g., pulmonary embolus, asthma, emphysema)     no 9. PE RISK FACTORS: Do you have a history of blood clots? (or: recent major surgery, recent prolonged travel, bedridden)     no 10. OTHER SYMPTOMS: Do you have any other symptoms? (e.g., runny nose, wheezing, chest pain)       Runny nose, sire throat 11. PREGNANCY: Is there any chance you are pregnant? When was your last menstrual period?       no 12. TRAVEL: Have you traveled out of the country in the last month? (e.g., travel history, exposures)       no  Protocols used: Cough - Acute Non-Productive-A-AH

## 2024-01-24 NOTE — Telephone Encounter (Signed)
 Pt has appointment 11:40 am 12/31

## 2024-01-25 ENCOUNTER — Encounter: Payer: Self-pay | Admitting: Family Medicine

## 2024-01-25 ENCOUNTER — Ambulatory Visit (INDEPENDENT_AMBULATORY_CARE_PROVIDER_SITE_OTHER): Admitting: Family Medicine

## 2024-01-25 VITALS — BP 112/60 | HR 96 | Temp 97.7°F | Ht 65.0 in | Wt 141.0 lb

## 2024-01-25 DIAGNOSIS — J111 Influenza due to unidentified influenza virus with other respiratory manifestations: Secondary | ICD-10-CM | POA: Diagnosis not present

## 2024-01-25 MED ORDER — HYDROCODONE BIT-HOMATROP MBR 5-1.5 MG/5ML PO SOLN: 5.0000 mL | Freq: Three times a day (TID) | ORAL | 0 refills | Status: AC | PRN

## 2024-01-25 NOTE — Progress Notes (Signed)
 " [facility] [address] [place] [address] Dept: [phone] Dept Fax: [phone]  Office Visit  Subjective:    Patient ID: Stephanie Roach, female    DOB: 04-12-1935, 88 y.o..   MRN: [id number]  Chief Complaint  Patient presents with   Cough    C/o having cough, chest/head congestion, ST x 1 week.  Has been taken NyQuil and Advil    History of Present Illness:  Patient is in today complaining of a 7-day history of  productive cough, shortness of breath, fatigue, subjective fever, nasal congestion, rhinorrhea, and sore throat. She is not aware of being around anyone else that is ill. She has used Nyquil and some other OTC medicine recommended by her pharmacist. She is not a smoker. She is able to eat and drink.  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   Breast cyst, left 11/29/2023   Type 2 diabetes mellitus with stage 2 chronic kidney disease, without long-term current use of insulin  (HCC) 02/16/2022   Grief reaction 04/02/2020   S/P lumpectomy, right breast 09/17/2019   Medication management 02/18/2015   Hiatal hernia 02/18/2015   Abnormal glucose    Hyperlipidemia associated with type 2 diabetes mellitus (HCC)    Labile hypertension    Vitamin D  deficiency    Hx of adenomatous colonic polyps 08/11/2010   Iron deficiency anemia 08/11/2010   Chronic anxiety 08/11/2010   Past Surgical History:  Procedure Laterality Date   BREAST BIOPSY Right 10/19/2018   FIBROCYSTIC CHANGE WITH USUAL DUCTAL HYPERPLASIA,   BREAST EXCISIONAL BIOPSY Right 07/05/2019   Negative for in situ or invasive carcinoma   BREAST LUMPECTOMY WITH RADIOACTIVE SEED LOCALIZATION Right 07/05/2019   Procedure: RIGHT BREAST LUMPECTOMY WITH RADIOACTIVE SEED LOCALIZATION;  Surgeon: Vernetta Berg, MD;  Location: [facility] OR;  Service: General;  Laterality: Right;   BUNIONECTOMY      CATARACT EXTRACTION Bilateral    CHOLECYSTECTOMY  2003   KNEE ARTHROSCOPY     TRANSESOPHAGEAL ECHOCARDIOGRAM (CATH LAB) N/A 12/12/2023   Procedure: TRANSESOPHAGEAL ECHOCARDIOGRAM;  Surgeon: Raford Riggs, MD;  Location: [facility] INVASIVE CV LAB;  Service: Cardiovascular;  Laterality: N/A;   WRIST FRACTURE SURGERY     Family History  Problem Relation Age of Onset   Colon cancer Father    Stroke Mother    Breast cancer Cousin    Pancreatic cancer Paternal Aunt    Ulcerative colitis Brother    Colon polyps Maternal Aunt    Outpatient Medications Prior to Visit  Medication Sig Dispense Refill   Blood Glucose Monitoring Suppl (ACCU-CHEK GUIDE) w/Device KIT USE TO CHECK BLOOD SUGAR ONCE DAILY OR AS DIRECTED. 1 kit 0   Blood Glucose Monitoring Suppl DEVI Check blood sugar morning and evening. May substitute to any manufacturer covered by patient's insurance. 1 each 0   Cholecalciferol (VITAMIN D3) 125 MCG (5000 UT) CAPS Take by mouth.     glipiZIDE  (GLUCOTROL ) 5 MG tablet Take 1 tablet (5 mg total) by mouth 2 (two) times daily with a meal. 180 tablet 1   Glucose Blood (BLOOD GLUCOSE TEST STRIPS) STRP Check blood sugar morning and evening. May substitute to any manufacturer covered by patient's insurance. 200 strip 3   Lancets MISC Check blood sugar morning and evening. Dispense based on patient and insurance preference. Use up to four times daily as directed. (FOR ICD-10 E10.9, E11.9). 200 each 3   LORazepam  (  ATIVAN ) 1 MG tablet Take 1 mg by mouth daily.     omeprazole  (PRILOSEC) 20 MG capsule Take 20 mg by mouth as needed.     rosuvastatin  (CRESTOR ) 20 MG tablet Take 1 tablet (20 mg total) by mouth daily. 90 tablet 3   traZODone  (DESYREL ) 50 MG tablet TAKE 1 TABLET BY MOUTH AS NEEDED 60 tablet 0   venlafaxine  XR (EFFEXOR -XR) 150 MG 24 hr capsule Take 150 mg by mouth at bedtime.     No facility-administered medications prior to visit.   Allergies[1]   Objective:   Today's Vitals   01/25/24  1150  BP: 112/60  Pulse: 96  Temp: 97.7 F (36.5 C)  TempSrc: Temporal  SpO2: 99%  Weight: 141 lb (64 kg)  Height: 5' 5 (1.651 m)   Body mass index is 23.46 kg/m.   General: Well developed, well nourished. No acute distress. HEENT: Normocephalic, non-traumatic. PERRL, EOMI. Conjunctiva clear. External ears normal. EAC and TMs normal bilaterally. Nose    clear without congestion or rhinorrhea. Mucous membranes moist. Oropharynx clear. Good dentition. Neck: Supple. No lymphadenopathy. No thyromegaly. Lungs: Moderate, congested cough. Clear to auscultation bilaterally, except mild sonorous upper airway noise. No wheezing, rales or   rhonchi. Psych: Alert and oriented. Normal mood and affect.  Health Maintenance Due  Topic Date Due   COVID-19 Vaccine (5 - 2025-26 season) 09/26/2023     Assessment & Plan:   Problem List Items Addressed This Visit       Other   Influenza-like illness - Primary   Symptoms are consistent with influenza. As she has been sick for 7 days, there is not a role for an anti-viral. Discussed home care for viral illness, including rest, pushing fluids, and OTC medications as needed for symptom relief. Recommend hot tea with honey for sore throat symptoms. I will prescribe cough syrup. Recommend she be reassessed next week.       Relevant Medications   HYDROcodone bit-homatropine (HYCODAN) 5-1.5 MG/5ML syrup    Return in about 1 week (around 02/01/2024) for Reassessment with Ms. Billy.    Garnette CHRISTELLA Simpler, MD  I,Emily Lagle,acting as a scribe for Garnette CHRISTELLA Simpler, MD.,have documented all relevant documentation on the behalf of Garnette CHRISTELLA Simpler, MD.  I, Garnette CHRISTELLA Simpler, MD, have reviewed all documentation for this visit. The documentation on 01/25/2024 for the exam, diagnosis, procedures, and orders are all accurate and complete.     [1]  Allergies Allergen Reactions   Morphine And Codeine     Unknown    "

## 2024-01-25 NOTE — Assessment & Plan Note (Signed)
 Symptoms are consistent with influenza. As she has been sick for 7 days, there is not a role for an anti-viral. Discussed home care for viral illness, including rest, pushing fluids, and OTC medications as needed for symptom relief. Recommend hot tea with honey for sore throat symptoms. I will prescribe cough syrup. Recommend she be reassessed next week.

## 2024-02-01 ENCOUNTER — Encounter: Payer: Self-pay | Admitting: Internal Medicine

## 2024-02-01 ENCOUNTER — Ambulatory Visit: Admitting: Internal Medicine

## 2024-02-01 VITALS — BP 122/80 | HR 90 | Temp 97.5°F | Ht 65.0 in | Wt 140.8 lb

## 2024-02-01 DIAGNOSIS — J111 Influenza due to unidentified influenza virus with other respiratory manifestations: Secondary | ICD-10-CM

## 2024-02-01 NOTE — Patient Instructions (Signed)
 Continue Nyquil and Mucinex as needed  Drink plenty of fluids

## 2024-02-01 NOTE — Progress Notes (Signed)
 " [facility] [address] [place] [address] Dept: [phone] Dept Fax: [phone]  Acute Care Office Visit  Subjective:   Stephanie Roach [birth year] 02/01/2024  Chief Complaint  Patient presents with   Follow-up    1 week still really tired,  some cough     HPI:  Discussed the use of AI scribe software for clinical note transcription with the patient, who gave verbal consent to proceed.  History of Present Illness   Murdis I Shaffer is an 89 year old female who presents for a one week follow up after influenza-like illness.  Initially seen on December 31st, 2025, with a seven-day history of productive cough, shortness of breath, fatigue, subjective fever, nasal congestion, rhinorrhea, and pharyngitis. She was told by primary care that she had the flu and was prescribed Hycodan cough syrup, which she did not purchase due to cost, opting for Mucinex and Nyquil instead.  Over the past week, symptoms have improved, but cough and fatigue linger. No fever. No difficulty breathing or chest pain. Reports dry eyes with discharge when waking up. These symptoms have improved, but eyes are now dry.   Eating and drinking well.      The following portions of the patient's history were reviewed and updated as appropriate: past medical history, past surgical history, family history, social history, allergies, medications, and problem list.   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   Breast cyst, left 11/29/2023   Type 2 diabetes mellitus with stage 2 chronic kidney disease, without long-term current use of insulin  (HCC) 02/16/2022   Grief reaction 04/02/2020   S/P lumpectomy, right breast 09/17/2019   Medication management 02/18/2015   Hiatal hernia 02/18/2015   Abnormal glucose    Hyperlipidemia associated with type 2 diabetes mellitus (HCC)     Labile hypertension    Vitamin D  deficiency    Hx of adenomatous colonic polyps 08/11/2010   Iron deficiency anemia 08/11/2010   Chronic anxiety 08/11/2010   Past Medical History:  Diagnosis Date   Allergic rhinitis    Anemia    Anxiety    Arthritis    Depression    GERD (gastroesophageal reflux disease)    Hyperlipidemia    Hypertension    Migraine    Prediabetes    Sleep apnea    no cpap   Vitamin D  deficiency    Past Surgical History:  Procedure Laterality Date   BREAST BIOPSY Right 10/19/2018   FIBROCYSTIC CHANGE WITH USUAL DUCTAL HYPERPLASIA,   BREAST EXCISIONAL BIOPSY Right 07/05/2019   Negative for in situ or invasive carcinoma   BREAST LUMPECTOMY WITH RADIOACTIVE SEED LOCALIZATION Right 07/05/2019   Procedure: RIGHT BREAST LUMPECTOMY WITH RADIOACTIVE SEED LOCALIZATION;  Surgeon: Vernetta Berg, MD;  Location: [facility] OR;  Service: General;  Laterality: Right;   BUNIONECTOMY     CATARACT EXTRACTION Bilateral    CHOLECYSTECTOMY  2003   KNEE ARTHROSCOPY     TRANSESOPHAGEAL ECHOCARDIOGRAM (CATH LAB) N/A 12/12/2023   Procedure: TRANSESOPHAGEAL ECHOCARDIOGRAM;  Surgeon: Raford Riggs, MD;  Location: [facility] INVASIVE CV LAB;  Service: Cardiovascular;  Laterality: N/A;   WRIST FRACTURE SURGERY     Family History  Problem Relation Age of Onset   Colon cancer Father    Stroke Mother    Breast cancer Cousin    Pancreatic cancer Paternal Aunt    Ulcerative colitis Brother  Colon polyps Maternal Aunt    Current Medications[1] Allergies[2]   ROS: A complete ROS was performed with pertinent positives/negatives noted in the HPI. The remainder of the ROS are negative.    Objective:   Today's Vitals   02/01/24 1046  BP: 122/80  Pulse: 90  Temp: (!) 97.5 F (36.4 C)  TempSrc: Temporal  SpO2: 96%  Weight: 140 lb 12.8 oz (63.9 kg)  Height: 5' 5 (1.651 m)    GENERAL: Well-appearing, in NAD. Well nourished.  SKIN: Pink, warm and dry. No rash.  NECK: Trachea  midline. Full ROM w/o pain or tenderness. No lymphadenopathy.  RESPIRATORY: Chest wall symmetrical. Respirations even and non-labored. Breath sounds clear to auscultation bilaterally. (+)dry cough CARDIAC: S1, S2 present, regular rate and rhythm. Peripheral pulses 2+ bilaterally.  EXTREMITIES: Without clubbing, cyanosis, or edema.  PSYCH/MENTAL STATUS: Alert, oriented x 3. Cooperative, appropriate mood and affect.    No results found for any visits on 02/01/24.    Assessment & Plan:  Assessment and Plan    Influenza-like illness Symptoms consistent with lingering effects of influenza. Cough has improved but still present.  - Continue Mucinex and Nyquil as needed for cough - Use Pataday eye drops for dry eyes and crusting. - Ensure adequate hydration and rest     No orders of the defined types were placed in this encounter.  No orders of the defined types were placed in this encounter.  Lab Orders  No laboratory test(s) ordered today   No images are attached to the encounter or orders placed in the encounter.  Return for Scheduled Routine Office Visits and as needed.   Rosina Senters, FNP    [1]  Current Outpatient Medications:    Blood Glucose Monitoring Suppl (ACCU-CHEK GUIDE) w/Device KIT, USE TO CHECK BLOOD SUGAR ONCE DAILY OR AS DIRECTED., Disp: 1 kit, Rfl: 0   Blood Glucose Monitoring Suppl DEVI, Check blood sugar morning and evening. May substitute to any manufacturer covered by patient's insurance., Disp: 1 each, Rfl: 0   Cholecalciferol (VITAMIN D3) 125 MCG (5000 UT) CAPS, Take by mouth., Disp: , Rfl:    glipiZIDE  (GLUCOTROL ) 5 MG tablet, Take 1 tablet (5 mg total) by mouth 2 (two) times daily with a meal., Disp: 180 tablet, Rfl: 1   Glucose Blood (BLOOD GLUCOSE TEST STRIPS) STRP, Check blood sugar morning and evening. May substitute to any manufacturer covered by patient's insurance., Disp: 200 strip, Rfl: 3   HYDROcodone  bit-homatropine (HYCODAN) 5-1.5 MG/5ML syrup,  Take 5 mLs by mouth every 8 (eight) hours as needed for cough., Disp: 120 mL, Rfl: 0   Lancets MISC, Check blood sugar morning and evening. Dispense based on patient and insurance preference. Use up to four times daily as directed. (FOR ICD-10 E10.9, E11.9)., Disp: 200 each, Rfl: 3   LORazepam  (ATIVAN ) 1 MG tablet, Take 1 mg by mouth daily., Disp: , Rfl:    omeprazole  (PRILOSEC) 20 MG capsule, Take 20 mg by mouth as needed., Disp: , Rfl:    rosuvastatin  (CRESTOR ) 20 MG tablet, Take 1 tablet (20 mg total) by mouth daily., Disp: 90 tablet, Rfl: 3   traZODone  (DESYREL ) 50 MG tablet, TAKE 1 TABLET BY MOUTH AS NEEDED, Disp: 60 tablet, Rfl: 0   venlafaxine  XR (EFFEXOR -XR) 150 MG 24 hr capsule, Take 150 mg by mouth at bedtime., Disp: , Rfl:  [2]  Allergies Allergen Reactions   Morphine And Codeine     Unknown    "

## 2024-02-03 ENCOUNTER — Ambulatory Visit: Payer: Self-pay | Admitting: Internal Medicine

## 2024-02-03 NOTE — Telephone Encounter (Signed)
 FYI Only or Action Required?: Action required by provider: medication refill request.  Patient was last seen in primary care on 02/01/2024 by Billy Knee, FNP.  Called Nurse Triage reporting Pain.  Symptoms began several days ago.  Interventions attempted: Nothing.  Symptoms are: gradually worsening.  Triage Disposition: See PCP When Office is Open (Within 3 Days)  Patient/caregiver understands and will follow disposition?: No, refuses disposition      Copied from CRM #[id number]. Topic: Clinical - Red Word Triage >> Feb 03, 2024  5:03 PM Jayma L wrote: Red Word that prompted transfer to Nurse Triage: Yeast infection - itching and burning really bad Reason for Disposition  [1] Symptoms of a yeast infection (i.e., itchy, white discharge, not bad smelling) AND [2] not improved > 3 days following Care Advice  Answer Assessment - Initial Assessment Questions Pt states that she needs fluconazole  for a yeast infection. She states that it has been going on a couple of days and getting worse. She was having accidents from coughing and was wearing depends and think that is what caused it. She does have white discharge, itching and burning. RN advised provider is gone for the day, offered appt for Monday. Rn did advise over the counter until medication can be called in or to go to UC. She stated no to all things and would just like this medication called in. Rn advised it would not be seen until Monday. Pt stated understanding.     1. SYMPTOM: What's the main symptom you're concerned about? (e.g., pain, itching, dryness)     Itching, burning, discharge 2. LOCATION: Where is the  symptoms located? (e.g., inside/outside, left/right)     Vaginal area 3. ONSET: When did the  symptoms  start?     A couple of days ago 4. PAIN: Is there any pain? If Yes, ask: How bad is it? (Scale: 1-10; mild, moderate, severe)     Yes- 'Just burning 5. ITCHING: Is there any itching? If Yes, ask:  How bad is it? (Scale: 1-10; mild, moderate, severe)     yes 6. CAUSE: What do you think is causing the discharge? Have you had the same problem before? What happened then?     Yeast infection 7. OTHER SYMPTOMS: Do you have any other symptoms? (e.g., fever, itching, vaginal bleeding, pain with urination, injury to genital area, vaginal foreign body)     Pain is not only with urination, she states all the time.  Protocols used: Vaginal Symptoms-A-AH

## 2024-02-06 ENCOUNTER — Other Ambulatory Visit: Payer: Self-pay | Admitting: Internal Medicine

## 2024-02-06 DIAGNOSIS — B3731 Acute candidiasis of vulva and vagina: Secondary | ICD-10-CM

## 2024-02-06 MED ORDER — FLUCONAZOLE 150 MG PO TABS: ORAL_TABLET | ORAL | 0 refills | Status: AC

## 2024-02-06 NOTE — Telephone Encounter (Signed)
 Pt notified that Diflucan  sent to [facility] on [address].

## 2024-02-06 NOTE — Telephone Encounter (Signed)
 Copied from CRM #[id number]. Topic: Clinical - Red Word Triage >> Feb 03, 2024  5:03 PM Jayma L wrote: Red Word that prompted transfer to Nurse Triage: Yeast infection - itching and burning really bad >> Feb 06, 2024  9:28 AM Macario HERO wrote: Patient called requesting medication for her yeast infection, advised request was sent to provider and waiting for a response. Requesting a call back.

## 2024-02-06 NOTE — Telephone Encounter (Signed)
 I have gone ahead and sent in Diflucan  for patient , please notify her. Thanks

## 2024-03-21 ENCOUNTER — Ambulatory Visit: Admitting: Internal Medicine

## 2024-03-26 ENCOUNTER — Ambulatory Visit (HOSPITAL_COMMUNITY)

## 2024-04-02 ENCOUNTER — Ambulatory Visit

## 2024-10-08 ENCOUNTER — Ambulatory Visit
